# Patient Record
Sex: Male | Born: 1935 | Race: White | Hispanic: No | Smoking: Former smoker
Health system: Southern US, Community
[De-identification: ages and names within clinical notes are randomized; demographics above are authoritative.]

## PROBLEM LIST (undated history)

## (undated) DIAGNOSIS — M1990 Unspecified osteoarthritis, unspecified site: Secondary | ICD-10-CM

## (undated) DIAGNOSIS — C801 Malignant (primary) neoplasm, unspecified: Secondary | ICD-10-CM

## (undated) DIAGNOSIS — I4891 Unspecified atrial fibrillation: Secondary | ICD-10-CM

## (undated) DIAGNOSIS — E785 Hyperlipidemia, unspecified: Secondary | ICD-10-CM

## (undated) DIAGNOSIS — E119 Type 2 diabetes mellitus without complications: Secondary | ICD-10-CM

## (undated) DIAGNOSIS — I639 Cerebral infarction, unspecified: Secondary | ICD-10-CM

## (undated) DIAGNOSIS — I509 Heart failure, unspecified: Secondary | ICD-10-CM

## (undated) DIAGNOSIS — I2510 Atherosclerotic heart disease of native coronary artery without angina pectoris: Secondary | ICD-10-CM

## (undated) DIAGNOSIS — I872 Venous insufficiency (chronic) (peripheral): Secondary | ICD-10-CM

## (undated) DIAGNOSIS — G2581 Restless legs syndrome: Secondary | ICD-10-CM

## (undated) DIAGNOSIS — I10 Essential (primary) hypertension: Secondary | ICD-10-CM

## (undated) HISTORY — PX: HERNIA REPAIR: SHX51

## (undated) HISTORY — DX: Cerebral infarction, unspecified: I63.9

## (undated) HISTORY — DX: Heart failure, unspecified: I50.9

## (undated) HISTORY — PX: SKIN CANCER EXCISION: SHX779

## (undated) HISTORY — PX: SHOULDER SURGERY: SHX246

## (undated) HISTORY — DX: Hyperlipidemia, unspecified: E78.5

## (undated) HISTORY — DX: Malignant (primary) neoplasm, unspecified: C80.1

## (undated) HISTORY — PX: EYE SURGERY: SHX253

## (undated) HISTORY — PX: BACK SURGERY: SHX140

## (undated) HISTORY — DX: Venous insufficiency (chronic) (peripheral): I87.2

## (undated) HISTORY — DX: Atherosclerotic heart disease of native coronary artery without angina pectoris: I25.10

## (undated) HISTORY — DX: Unspecified osteoarthritis, unspecified site: M19.90

## (undated) HISTORY — DX: Essential (primary) hypertension: I10

## (undated) HISTORY — DX: Type 2 diabetes mellitus without complications: E11.9

---

## 2004-08-02 ENCOUNTER — Ambulatory Visit: Payer: Self-pay | Admitting: Family Medicine

## 2005-02-10 ENCOUNTER — Other Ambulatory Visit: Payer: Self-pay

## 2005-02-10 ENCOUNTER — Inpatient Hospital Stay: Payer: Self-pay | Admitting: Internal Medicine

## 2005-07-23 ENCOUNTER — Inpatient Hospital Stay: Payer: Self-pay | Admitting: Internal Medicine

## 2005-07-26 ENCOUNTER — Inpatient Hospital Stay: Payer: Self-pay | Admitting: Internal Medicine

## 2005-07-26 ENCOUNTER — Other Ambulatory Visit: Payer: Self-pay

## 2005-07-27 ENCOUNTER — Other Ambulatory Visit: Payer: Self-pay

## 2005-09-20 ENCOUNTER — Ambulatory Visit: Payer: Self-pay | Admitting: Family Medicine

## 2006-08-02 ENCOUNTER — Ambulatory Visit: Payer: Self-pay | Admitting: Ophthalmology

## 2006-08-09 ENCOUNTER — Ambulatory Visit: Payer: Self-pay | Admitting: Ophthalmology

## 2006-09-13 ENCOUNTER — Ambulatory Visit: Payer: Self-pay | Admitting: Ophthalmology

## 2006-09-20 ENCOUNTER — Ambulatory Visit: Payer: Self-pay | Admitting: Ophthalmology

## 2006-11-15 ENCOUNTER — Emergency Department: Payer: Self-pay

## 2006-11-15 ENCOUNTER — Other Ambulatory Visit: Payer: Self-pay

## 2006-11-22 ENCOUNTER — Ambulatory Visit: Payer: Self-pay | Admitting: Family Medicine

## 2007-04-03 ENCOUNTER — Ambulatory Visit: Payer: Self-pay | Admitting: Family Medicine

## 2007-04-03 ENCOUNTER — Ambulatory Visit: Payer: Self-pay | Admitting: Gastroenterology

## 2007-04-04 ENCOUNTER — Ambulatory Visit: Payer: Self-pay | Admitting: Family Medicine

## 2007-04-12 ENCOUNTER — Inpatient Hospital Stay: Payer: Self-pay | Admitting: Surgery

## 2007-05-15 ENCOUNTER — Ambulatory Visit: Payer: Self-pay | Admitting: Oncology

## 2007-05-22 ENCOUNTER — Ambulatory Visit: Payer: Self-pay | Admitting: Surgery

## 2007-06-14 ENCOUNTER — Ambulatory Visit: Payer: Self-pay | Admitting: Oncology

## 2007-07-15 ENCOUNTER — Ambulatory Visit: Payer: Self-pay | Admitting: Oncology

## 2007-08-12 ENCOUNTER — Ambulatory Visit: Payer: Self-pay | Admitting: Oncology

## 2007-09-12 ENCOUNTER — Ambulatory Visit: Payer: Self-pay | Admitting: Oncology

## 2007-10-12 ENCOUNTER — Ambulatory Visit: Payer: Self-pay | Admitting: Oncology

## 2007-11-12 ENCOUNTER — Ambulatory Visit: Payer: Self-pay | Admitting: Oncology

## 2007-12-12 ENCOUNTER — Ambulatory Visit: Payer: Self-pay | Admitting: Oncology

## 2008-01-12 ENCOUNTER — Ambulatory Visit: Payer: Self-pay | Admitting: Oncology

## 2008-01-26 ENCOUNTER — Other Ambulatory Visit: Payer: Self-pay

## 2008-01-26 ENCOUNTER — Emergency Department: Payer: Self-pay | Admitting: Emergency Medicine

## 2008-02-12 ENCOUNTER — Ambulatory Visit: Payer: Self-pay | Admitting: Oncology

## 2008-03-13 ENCOUNTER — Ambulatory Visit: Payer: Self-pay | Admitting: Oncology

## 2008-03-28 ENCOUNTER — Ambulatory Visit: Payer: Self-pay | Admitting: Surgery

## 2008-04-03 ENCOUNTER — Ambulatory Visit: Payer: Self-pay | Admitting: Surgery

## 2008-04-13 ENCOUNTER — Ambulatory Visit: Payer: Self-pay | Admitting: Oncology

## 2008-05-13 ENCOUNTER — Ambulatory Visit: Payer: Self-pay | Admitting: Oncology

## 2008-06-13 ENCOUNTER — Ambulatory Visit: Payer: Self-pay | Admitting: Oncology

## 2008-07-14 ENCOUNTER — Ambulatory Visit: Payer: Self-pay | Admitting: Oncology

## 2008-08-11 ENCOUNTER — Ambulatory Visit: Payer: Self-pay | Admitting: Oncology

## 2008-09-11 ENCOUNTER — Ambulatory Visit: Payer: Self-pay | Admitting: Oncology

## 2008-10-11 ENCOUNTER — Ambulatory Visit: Payer: Self-pay | Admitting: Oncology

## 2008-11-11 ENCOUNTER — Ambulatory Visit: Payer: Self-pay | Admitting: Oncology

## 2008-11-19 ENCOUNTER — Ambulatory Visit: Payer: Self-pay | Admitting: Family Medicine

## 2008-12-16 ENCOUNTER — Ambulatory Visit: Payer: Self-pay | Admitting: Oncology

## 2009-01-11 ENCOUNTER — Ambulatory Visit: Payer: Self-pay | Admitting: Oncology

## 2009-02-08 ENCOUNTER — Inpatient Hospital Stay: Payer: Self-pay | Admitting: Internal Medicine

## 2009-02-11 ENCOUNTER — Ambulatory Visit: Payer: Self-pay | Admitting: Oncology

## 2009-03-25 ENCOUNTER — Ambulatory Visit: Payer: Self-pay | Admitting: Family Medicine

## 2009-04-13 ENCOUNTER — Ambulatory Visit: Payer: Self-pay | Admitting: Oncology

## 2009-04-14 ENCOUNTER — Ambulatory Visit: Payer: Self-pay | Admitting: Oncology

## 2009-05-04 ENCOUNTER — Ambulatory Visit: Payer: Self-pay | Admitting: Gastroenterology

## 2009-05-13 ENCOUNTER — Ambulatory Visit: Payer: Self-pay | Admitting: Oncology

## 2009-05-26 ENCOUNTER — Ambulatory Visit: Payer: Self-pay | Admitting: Surgery

## 2009-07-14 ENCOUNTER — Ambulatory Visit: Payer: Self-pay | Admitting: Oncology

## 2009-08-04 ENCOUNTER — Ambulatory Visit: Payer: Self-pay | Admitting: Oncology

## 2009-08-11 ENCOUNTER — Ambulatory Visit: Payer: Self-pay | Admitting: Oncology

## 2010-02-11 ENCOUNTER — Ambulatory Visit: Payer: Self-pay | Admitting: Oncology

## 2010-02-24 ENCOUNTER — Ambulatory Visit: Payer: Self-pay | Admitting: Nurse Practitioner

## 2010-02-26 LAB — CEA: CEA: 2.1 ng/mL (ref 0.0–4.7)

## 2010-03-13 ENCOUNTER — Ambulatory Visit: Payer: Self-pay | Admitting: Nurse Practitioner

## 2010-03-13 ENCOUNTER — Ambulatory Visit: Payer: Self-pay | Admitting: Oncology

## 2010-04-19 ENCOUNTER — Ambulatory Visit: Payer: Self-pay | Admitting: Family Medicine

## 2010-05-10 ENCOUNTER — Ambulatory Visit: Payer: Self-pay | Admitting: Family Medicine

## 2010-05-27 ENCOUNTER — Ambulatory Visit: Payer: Self-pay | Admitting: Family Medicine

## 2010-06-23 ENCOUNTER — Ambulatory Visit: Payer: Self-pay | Admitting: Oncology

## 2010-06-25 LAB — CEA: CEA: 2 ng/mL (ref 0.0–4.7)

## 2010-07-14 ENCOUNTER — Ambulatory Visit: Payer: Self-pay | Admitting: Oncology

## 2010-08-25 ENCOUNTER — Ambulatory Visit: Payer: Self-pay | Admitting: Oncology

## 2010-09-12 ENCOUNTER — Ambulatory Visit: Payer: Self-pay | Admitting: Oncology

## 2011-07-01 ENCOUNTER — Ambulatory Visit: Payer: Self-pay

## 2012-01-10 ENCOUNTER — Emergency Department: Payer: Self-pay | Admitting: Emergency Medicine

## 2012-01-10 LAB — COMPREHENSIVE METABOLIC PANEL
Albumin: 4.2 g/dL (ref 3.4–5.0)
Alkaline Phosphatase: 95 U/L (ref 50–136)
BUN: 30 mg/dL — ABNORMAL HIGH (ref 7–18)
Bilirubin,Total: 0.5 mg/dL (ref 0.2–1.0)
Co2: 30 mmol/L (ref 21–32)
Creatinine: 1.25 mg/dL (ref 0.60–1.30)
EGFR (African American): >60
EGFR (Non-African Amer.): 54 — ABNORMAL LOW
Glucose: 423 mg/dL — ABNORMAL HIGH (ref 65–99)
SGOT(AST): 27 U/L (ref 15–37)
SGPT (ALT): 24 U/L
Total Protein: 7.3 g/dL (ref 6.4–8.2)

## 2012-01-10 LAB — CBC WITH DIFFERENTIAL/PLATELET
Basophil #: 0 10*3/uL (ref 0.0–0.1)
Eosinophil #: 0.3 10*3/uL (ref 0.0–0.7)
Lymphocyte %: 22.4 %
MCH: 32.3 pg (ref 26.0–34.0)
MCHC: 34.9 g/dL (ref 32.0–36.0)
Monocyte #: 0.5 x10 3/mm (ref 0.2–1.0)
Neutrophil %: 65.8 %
Platelet: 144 10*3/uL — ABNORMAL LOW (ref 150–440)
RBC: 4.19 10*6/uL — ABNORMAL LOW (ref 4.40–5.90)
RDW: 13.9 % (ref 11.5–14.5)
WBC: 6.5 10*3/uL (ref 3.8–10.6)

## 2012-01-10 LAB — MAGNESIUM: Magnesium: 1.6 mg/dL — ABNORMAL LOW

## 2012-01-10 LAB — URINALYSIS, COMPLETE
Bacteria: NONE SEEN
Leukocyte Esterase: NEGATIVE
Ph: 5 (ref 4.5–8.0)
Protein: NEGATIVE
RBC,UR: <1 /HPF (ref 0–5)

## 2012-01-10 LAB — CK TOTAL AND CKMB (NOT AT ARMC)
CK, Total: 73 U/L (ref 35–232)
CK-MB: 3 ng/mL (ref 0.5–3.6)

## 2012-01-10 LAB — TSH: Thyroid Stimulating Horm: 1.22 u[IU]/mL

## 2012-01-10 LAB — TROPONIN I: Troponin-I: <0.02 ng/mL

## 2012-06-13 HISTORY — PX: COLONOSCOPY W/ BIOPSIES AND MANOMETRY CATHETER PLACEMENT: SHX1375

## 2012-06-18 ENCOUNTER — Ambulatory Visit: Payer: Self-pay | Admitting: Surgery

## 2012-06-18 LAB — BASIC METABOLIC PANEL
BUN: 21 mg/dL — ABNORMAL HIGH (ref 7–18)
Calcium, Total: 9 mg/dL (ref 8.5–10.1)
Co2: 28 mmol/L (ref 21–32)
Creatinine: 0.93 mg/dL (ref 0.60–1.30)
EGFR (African American): >60
EGFR (Non-African Amer.): >60
Glucose: 120 mg/dL — ABNORMAL HIGH (ref 65–99)
Potassium: 2.9 mmol/L — ABNORMAL LOW (ref 3.5–5.1)

## 2012-06-18 LAB — CBC WITH DIFFERENTIAL/PLATELET
Basophil #: 0 10*3/uL (ref 0.0–0.1)
Basophil %: 0.7 %
Eosinophil %: 3.9 %
HGB: 12.7 g/dL — ABNORMAL LOW (ref 13.0–18.0)
MCH: 30 pg (ref 26.0–34.0)
MCHC: 33.5 g/dL (ref 32.0–36.0)
MCV: 90 fL (ref 80–100)
Monocyte %: 7.7 %
Neutrophil %: 66.5 %
RBC: 4.22 10*6/uL — ABNORMAL LOW (ref 4.40–5.90)
WBC: 6.5 10*3/uL (ref 3.8–10.6)

## 2012-06-21 ENCOUNTER — Ambulatory Visit: Payer: Self-pay | Admitting: Surgery

## 2012-06-21 LAB — POTASSIUM: Potassium: 4.2 mmol/L (ref 3.5–5.1)

## 2012-06-22 ENCOUNTER — Ambulatory Visit: Payer: Self-pay | Admitting: Surgery

## 2012-06-22 LAB — POTASSIUM: Potassium: 4 mmol/L (ref 3.5–5.1)

## 2012-06-25 LAB — PATHOLOGY REPORT

## 2012-07-04 ENCOUNTER — Ambulatory Visit: Payer: Self-pay | Admitting: Family Medicine

## 2013-05-06 ENCOUNTER — Ambulatory Visit: Payer: Self-pay | Admitting: Gastroenterology

## 2013-05-21 ENCOUNTER — Ambulatory Visit: Payer: Self-pay | Admitting: Family Medicine

## 2013-05-21 LAB — CBC WITH DIFFERENTIAL/PLATELET
Basophil #: 0 10*3/uL (ref 0.0–0.1)
HCT: 31.9 % — ABNORMAL LOW (ref 40.0–52.0)
Lymphocyte #: 0.6 10*3/uL — ABNORMAL LOW (ref 1.0–3.6)
Lymphocyte %: 16.6 %
MCH: 29.9 pg (ref 26.0–34.0)
MCHC: 32.4 g/dL (ref 32.0–36.0)
MCV: 92 fL (ref 80–100)
Monocyte #: 0.4 x10 3/mm (ref 0.2–1.0)
Neutrophil %: 65.6 %

## 2013-05-23 ENCOUNTER — Inpatient Hospital Stay: Payer: Self-pay | Admitting: Internal Medicine

## 2013-05-23 LAB — TROPONIN I: Troponin-I: <0.02 ng/mL

## 2013-05-23 LAB — CBC
HCT: 34.8 % — ABNORMAL LOW (ref 40.0–52.0)
MCH: 29.2 pg (ref 26.0–34.0)
MCV: 90 fL (ref 80–100)
WBC: 4.1 10*3/uL (ref 3.8–10.6)

## 2013-05-23 LAB — COMPREHENSIVE METABOLIC PANEL
Albumin: 3.3 g/dL — ABNORMAL LOW (ref 3.4–5.0)
Alkaline Phosphatase: 98 U/L
BUN: 13 mg/dL (ref 7–18)
Bilirubin,Total: 0.7 mg/dL (ref 0.2–1.0)
Calcium, Total: 8.9 mg/dL (ref 8.5–10.1)
Creatinine: 0.86 mg/dL (ref 0.60–1.30)
EGFR (Non-African Amer.): >60
Glucose: 167 mg/dL — ABNORMAL HIGH (ref 65–99)
Osmolality: 278 (ref 275–301)
Total Protein: 6.7 g/dL (ref 6.4–8.2)

## 2013-05-23 LAB — MAGNESIUM: Magnesium: 1.5 mg/dL — ABNORMAL LOW

## 2013-05-24 LAB — TSH: Thyroid Stimulating Horm: 0.881 u[IU]/mL

## 2013-05-24 LAB — HEMOGLOBIN A1C: Hemoglobin A1C: 7.9 % — ABNORMAL HIGH (ref 4.2–6.3)

## 2013-09-22 ENCOUNTER — Inpatient Hospital Stay: Payer: Self-pay | Admitting: Internal Medicine

## 2013-09-22 LAB — CK TOTAL AND CKMB (NOT AT ARMC)
CK, Total: 235 U/L
CK-MB: 5.1 ng/mL — AB (ref 0.5–3.6)

## 2013-09-22 LAB — BASIC METABOLIC PANEL
Anion Gap: 6 — ABNORMAL LOW (ref 7–16)
BUN: 23 mg/dL — AB (ref 7–18)
CALCIUM: 8.7 mg/dL (ref 8.5–10.1)
CHLORIDE: 103 mmol/L (ref 98–107)
CO2: 33 mmol/L — AB (ref 21–32)
Creatinine: 0.95 mg/dL (ref 0.60–1.30)
EGFR (African American): >60
EGFR (Non-African Amer.): >60
Glucose: 114 mg/dL — ABNORMAL HIGH (ref 65–99)
Osmolality: 288 (ref 275–301)
Potassium: 3.3 mmol/L — ABNORMAL LOW (ref 3.5–5.1)
SODIUM: 142 mmol/L (ref 136–145)

## 2013-09-22 LAB — CBC
HCT: 34.1 % — ABNORMAL LOW (ref 40.0–52.0)
HGB: 11.2 g/dL — AB (ref 13.0–18.0)
MCH: 29.4 pg (ref 26.0–34.0)
MCHC: 32.7 g/dL (ref 32.0–36.0)
MCV: 90 fL (ref 80–100)
Platelet: 126 10*3/uL — ABNORMAL LOW (ref 150–440)
RBC: 3.8 10*6/uL — ABNORMAL LOW (ref 4.40–5.90)
RDW: 15.9 % — ABNORMAL HIGH (ref 11.5–14.5)
WBC: 5.6 10*3/uL (ref 3.8–10.6)

## 2013-09-22 LAB — TSH: Thyroid Stimulating Horm: 3.91 u[IU]/mL

## 2013-09-22 LAB — PRO B NATRIURETIC PEPTIDE: B-TYPE NATIURETIC PEPTID: 1709 pg/mL — AB (ref 0–450)

## 2013-09-22 LAB — PROTIME-INR
INR: 1.3
Prothrombin Time: 15.8 secs — ABNORMAL HIGH (ref 11.5–14.7)

## 2013-09-22 LAB — TROPONIN I: Troponin-I: <0.02 ng/mL

## 2013-09-22 LAB — APTT: Activated PTT: 30 secs (ref 23.6–35.9)

## 2013-09-23 LAB — BASIC METABOLIC PANEL
ANION GAP: 7 (ref 7–16)
BUN: 18 mg/dL (ref 7–18)
CALCIUM: 8.3 mg/dL — AB (ref 8.5–10.1)
CO2: 33 mmol/L — AB (ref 21–32)
CREATININE: 0.8 mg/dL (ref 0.60–1.30)
Chloride: 104 mmol/L (ref 98–107)
EGFR (Non-African Amer.): >60
GLUCOSE: 77 mg/dL (ref 65–99)
Osmolality: 288 (ref 275–301)
Potassium: 2.9 mmol/L — ABNORMAL LOW (ref 3.5–5.1)
Sodium: 144 mmol/L (ref 136–145)

## 2013-09-23 LAB — TROPONIN I
Troponin-I: 0.02 ng/mL
Troponin-I: <0.02 ng/mL

## 2013-09-24 LAB — BASIC METABOLIC PANEL
Anion Gap: 3 — ABNORMAL LOW (ref 7–16)
BUN: 21 mg/dL — ABNORMAL HIGH (ref 7–18)
CALCIUM: 8.6 mg/dL (ref 8.5–10.1)
Chloride: 104 mmol/L (ref 98–107)
Co2: 35 mmol/L — ABNORMAL HIGH (ref 21–32)
Creatinine: 0.98 mg/dL (ref 0.60–1.30)
GLUCOSE: 44 mg/dL — AB (ref 65–99)
OSMOLALITY: 283 (ref 275–301)
POTASSIUM: 3.2 mmol/L — AB (ref 3.5–5.1)
SODIUM: 142 mmol/L (ref 136–145)

## 2013-09-24 LAB — PROTIME-INR
INR: 1.3
Prothrombin Time: 15.8 secs — ABNORMAL HIGH (ref 11.5–14.7)

## 2013-09-24 LAB — MAGNESIUM: Magnesium: 1.9 mg/dL

## 2013-09-25 LAB — PROTIME-INR
INR: 1.5
Prothrombin Time: 18.1 secs — ABNORMAL HIGH (ref 11.5–14.7)

## 2013-09-25 LAB — BASIC METABOLIC PANEL
ANION GAP: 5 — AB (ref 7–16)
BUN: 22 mg/dL — ABNORMAL HIGH (ref 7–18)
CO2: 32 mmol/L (ref 21–32)
Calcium, Total: 8.4 mg/dL — ABNORMAL LOW (ref 8.5–10.1)
Chloride: 104 mmol/L (ref 98–107)
Creatinine: 0.79 mg/dL (ref 0.60–1.30)
EGFR (African American): >60
EGFR (Non-African Amer.): >60
GLUCOSE: 119 mg/dL — AB (ref 65–99)
Osmolality: 286 (ref 275–301)
POTASSIUM: 3.8 mmol/L (ref 3.5–5.1)
SODIUM: 141 mmol/L (ref 136–145)

## 2013-09-26 LAB — PROTIME-INR
INR: 1.8
PROTHROMBIN TIME: 20.2 s — AB (ref 11.5–14.7)

## 2013-09-26 LAB — HEMOGLOBIN: HGB: 10.8 g/dL — ABNORMAL LOW (ref 13.0–18.0)

## 2013-09-26 LAB — PLATELET COUNT: Platelet: 101 10*3/uL — ABNORMAL LOW (ref 150–440)

## 2013-09-27 LAB — BASIC METABOLIC PANEL
Anion Gap: 2 — ABNORMAL LOW (ref 7–16)
BUN: 26 mg/dL — ABNORMAL HIGH (ref 7–18)
CALCIUM: 8.5 mg/dL (ref 8.5–10.1)
Chloride: 100 mmol/L (ref 98–107)
Co2: 34 mmol/L — ABNORMAL HIGH (ref 21–32)
Creatinine: 1 mg/dL (ref 0.60–1.30)
EGFR (African American): >60
EGFR (Non-African Amer.): >60
Glucose: 319 mg/dL — ABNORMAL HIGH (ref 65–99)
OSMOLALITY: 289 (ref 275–301)
Potassium: 4.1 mmol/L (ref 3.5–5.1)
SODIUM: 136 mmol/L (ref 136–145)

## 2013-09-27 LAB — CBC WITH DIFFERENTIAL/PLATELET
BASOS PCT: 0.9 %
Basophil #: 0 10*3/uL (ref 0.0–0.1)
EOS PCT: 5.4 %
Eosinophil #: 0.2 10*3/uL (ref 0.0–0.7)
HCT: 31.7 % — AB (ref 40.0–52.0)
HGB: 10.2 g/dL — ABNORMAL LOW (ref 13.0–18.0)
Lymphocyte #: 1 10*3/uL (ref 1.0–3.6)
Lymphocyte %: 22.3 %
MCH: 28.7 pg (ref 26.0–34.0)
MCHC: 32.3 g/dL (ref 32.0–36.0)
MCV: 89 fL (ref 80–100)
MONOS PCT: 10.7 %
Monocyte #: 0.5 x10 3/mm (ref 0.2–1.0)
NEUTROS ABS: 2.6 10*3/uL (ref 1.4–6.5)
Neutrophil %: 60.7 %
PLATELETS: 98 10*3/uL — AB (ref 150–440)
RBC: 3.56 10*6/uL — AB (ref 4.40–5.90)
RDW: 16.1 % — ABNORMAL HIGH (ref 11.5–14.5)
WBC: 4.4 10*3/uL (ref 3.8–10.6)

## 2013-09-27 LAB — PROTIME-INR
INR: 2
Prothrombin Time: 22.2 secs — ABNORMAL HIGH (ref 11.5–14.7)

## 2013-09-28 LAB — CBC WITH DIFFERENTIAL/PLATELET
Basophil #: 0 10*3/uL (ref 0.0–0.1)
Basophil %: 0.8 %
EOS ABS: 0.3 10*3/uL (ref 0.0–0.7)
EOS PCT: 5.4 %
HCT: 33.8 % — ABNORMAL LOW (ref 40.0–52.0)
HGB: 10.7 g/dL — ABNORMAL LOW (ref 13.0–18.0)
Lymphocyte #: 1.2 10*3/uL (ref 1.0–3.6)
Lymphocyte %: 22.6 %
MCH: 28.3 pg (ref 26.0–34.0)
MCHC: 31.5 g/dL — ABNORMAL LOW (ref 32.0–36.0)
MCV: 90 fL (ref 80–100)
Monocyte #: 0.5 x10 3/mm (ref 0.2–1.0)
Monocyte %: 9.7 %
NEUTROS ABS: 3.3 10*3/uL (ref 1.4–6.5)
Neutrophil %: 61.5 %
PLATELETS: 117 10*3/uL — AB (ref 150–440)
RBC: 3.77 10*6/uL — ABNORMAL LOW (ref 4.40–5.90)
RDW: 16.2 % — ABNORMAL HIGH (ref 11.5–14.5)
WBC: 5.3 10*3/uL (ref 3.8–10.6)

## 2013-09-28 LAB — PROTIME-INR
INR: 2
Prothrombin Time: 22.4 secs — ABNORMAL HIGH (ref 11.5–14.7)

## 2014-03-07 ENCOUNTER — Inpatient Hospital Stay: Payer: Self-pay | Admitting: Internal Medicine

## 2014-03-07 LAB — COMPREHENSIVE METABOLIC PANEL
ALBUMIN: 3.7 g/dL (ref 3.4–5.0)
Alkaline Phosphatase: 109 U/L
Anion Gap: 5 — ABNORMAL LOW (ref 7–16)
BILIRUBIN TOTAL: 0.8 mg/dL (ref 0.2–1.0)
BUN: 21 mg/dL — ABNORMAL HIGH (ref 7–18)
CALCIUM: 8.3 mg/dL — AB (ref 8.5–10.1)
CO2: 32 mmol/L (ref 21–32)
CREATININE: 1.12 mg/dL (ref 0.60–1.30)
Chloride: 103 mmol/L (ref 98–107)
EGFR (Non-African Amer.): >60
Glucose: 173 mg/dL — ABNORMAL HIGH (ref 65–99)
Osmolality: 287 (ref 275–301)
POTASSIUM: 3.6 mmol/L (ref 3.5–5.1)
SGOT(AST): 24 U/L (ref 15–37)
SGPT (ALT): 30 U/L
SODIUM: 140 mmol/L (ref 136–145)
Total Protein: 6.8 g/dL (ref 6.4–8.2)

## 2014-03-07 LAB — CK TOTAL AND CKMB (NOT AT ARMC)
CK, TOTAL: 125 U/L
CK-MB: 3.3 ng/mL (ref 0.5–3.6)

## 2014-03-07 LAB — PROTIME-INR
INR: 1.2
Prothrombin Time: 15.4 secs — ABNORMAL HIGH (ref 11.5–14.7)

## 2014-03-07 LAB — CBC
HCT: 32.3 % — ABNORMAL LOW (ref 40.0–52.0)
HGB: 10.5 g/dL — AB (ref 13.0–18.0)
MCH: 28.9 pg (ref 26.0–34.0)
MCHC: 32.5 g/dL (ref 32.0–36.0)
MCV: 89 fL (ref 80–100)
Platelet: 117 10*3/uL — ABNORMAL LOW (ref 150–440)
RBC: 3.63 10*6/uL — ABNORMAL LOW (ref 4.40–5.90)
RDW: 16.7 % — ABNORMAL HIGH (ref 11.5–14.5)
WBC: 5.3 10*3/uL (ref 3.8–10.6)

## 2014-03-07 LAB — TROPONIN I: Troponin-I: 0.03 ng/mL

## 2014-03-07 LAB — PRO B NATRIURETIC PEPTIDE: B-Type Natriuretic Peptide: 3038 pg/mL — ABNORMAL HIGH (ref 0–450)

## 2014-03-08 LAB — CBC WITH DIFFERENTIAL/PLATELET
Basophil #: 0.1 10*3/uL (ref 0.0–0.1)
Basophil %: 1.2 %
Eosinophil #: 0.4 10*3/uL (ref 0.0–0.7)
Eosinophil %: 6.8 %
HCT: 33.8 % — AB (ref 40.0–52.0)
HGB: 11 g/dL — AB (ref 13.0–18.0)
Lymphocyte #: 1.2 10*3/uL (ref 1.0–3.6)
Lymphocyte %: 21.4 %
MCH: 29.1 pg (ref 26.0–34.0)
MCHC: 32.5 g/dL (ref 32.0–36.0)
MCV: 90 fL (ref 80–100)
Monocyte #: 0.5 x10 3/mm (ref 0.2–1.0)
Monocyte %: 9.5 %
NEUTROS PCT: 61.1 %
Neutrophil #: 3.3 10*3/uL (ref 1.4–6.5)
PLATELETS: 106 10*3/uL — AB (ref 150–440)
RBC: 3.77 10*6/uL — AB (ref 4.40–5.90)
RDW: 16.8 % — ABNORMAL HIGH (ref 11.5–14.5)
WBC: 5.5 10*3/uL (ref 3.8–10.6)

## 2014-03-08 LAB — COMPREHENSIVE METABOLIC PANEL
ALBUMIN: 3.6 g/dL (ref 3.4–5.0)
ANION GAP: 7 (ref 7–16)
Alkaline Phosphatase: 97 U/L
BILIRUBIN TOTAL: 0.8 mg/dL (ref 0.2–1.0)
BUN: 19 mg/dL — ABNORMAL HIGH (ref 7–18)
CREATININE: 0.94 mg/dL (ref 0.60–1.30)
Calcium, Total: 8.6 mg/dL (ref 8.5–10.1)
Chloride: 102 mmol/L (ref 98–107)
Co2: 33 mmol/L — ABNORMAL HIGH (ref 21–32)
Glucose: 192 mg/dL — ABNORMAL HIGH (ref 65–99)
OSMOLALITY: 291 (ref 275–301)
Potassium: 3.5 mmol/L (ref 3.5–5.1)
SGOT(AST): 29 U/L (ref 15–37)
SGPT (ALT): 30 U/L
Sodium: 142 mmol/L (ref 136–145)
Total Protein: 6.5 g/dL (ref 6.4–8.2)

## 2014-03-08 LAB — PRO B NATRIURETIC PEPTIDE: B-Type Natriuretic Peptide: 2972 pg/mL — ABNORMAL HIGH (ref 0–450)

## 2014-03-08 LAB — MAGNESIUM: MAGNESIUM: 1.6 mg/dL — AB

## 2014-03-08 LAB — TROPONIN I
Troponin-I: 0.03 ng/mL
Troponin-I: 0.03 ng/mL

## 2014-07-16 ENCOUNTER — Emergency Department: Payer: Self-pay | Admitting: Emergency Medicine

## 2014-07-16 DIAGNOSIS — R079 Chest pain, unspecified: Secondary | ICD-10-CM | POA: Diagnosis not present

## 2014-07-16 DIAGNOSIS — Z794 Long term (current) use of insulin: Secondary | ICD-10-CM | POA: Diagnosis not present

## 2014-07-16 DIAGNOSIS — I517 Cardiomegaly: Secondary | ICD-10-CM | POA: Diagnosis not present

## 2014-07-16 DIAGNOSIS — I10 Essential (primary) hypertension: Secondary | ICD-10-CM | POA: Diagnosis not present

## 2014-07-16 DIAGNOSIS — Z7982 Long term (current) use of aspirin: Secondary | ICD-10-CM | POA: Diagnosis not present

## 2014-07-16 DIAGNOSIS — E119 Type 2 diabetes mellitus without complications: Secondary | ICD-10-CM | POA: Diagnosis not present

## 2014-07-16 DIAGNOSIS — R0602 Shortness of breath: Secondary | ICD-10-CM | POA: Diagnosis not present

## 2014-07-16 DIAGNOSIS — Z79899 Other long term (current) drug therapy: Secondary | ICD-10-CM | POA: Diagnosis not present

## 2014-07-16 LAB — PRO B NATRIURETIC PEPTIDE: B-Type Natriuretic Peptide: 2023 pg/mL — ABNORMAL HIGH

## 2014-07-16 LAB — CBC
HCT: 33.8 % — AB (ref 40.0–52.0)
HGB: 10.9 g/dL — AB (ref 13.0–18.0)
MCH: 29 pg (ref 26.0–34.0)
MCHC: 32.3 g/dL (ref 32.0–36.0)
MCV: 90 fL (ref 80–100)
Platelet: 90 10*3/uL — ABNORMAL LOW (ref 150–440)
RBC: 3.78 10*6/uL — ABNORMAL LOW (ref 4.40–5.90)
RDW: 17.9 % — ABNORMAL HIGH (ref 11.5–14.5)
WBC: 5.8 10*3/uL (ref 3.8–10.6)

## 2014-07-16 LAB — TROPONIN I: Troponin-I: 0.03 ng/mL

## 2014-07-16 LAB — BASIC METABOLIC PANEL
ANION GAP: 8 (ref 7–16)
BUN: 21 mg/dL — AB (ref 7–18)
CO2: 33 mmol/L — AB (ref 21–32)
CREATININE: 1.17 mg/dL (ref 0.60–1.30)
Calcium, Total: 8.6 mg/dL (ref 8.5–10.1)
Chloride: 104 mmol/L (ref 98–107)
EGFR (African American): >60
GLUCOSE: 146 mg/dL — AB (ref 65–99)
Osmolality: 294 (ref 275–301)
Potassium: 3.2 mmol/L — ABNORMAL LOW (ref 3.5–5.1)
SODIUM: 145 mmol/L (ref 136–145)

## 2014-07-18 DIAGNOSIS — E782 Mixed hyperlipidemia: Secondary | ICD-10-CM | POA: Diagnosis not present

## 2014-07-18 DIAGNOSIS — I5032 Chronic diastolic (congestive) heart failure: Secondary | ICD-10-CM | POA: Diagnosis not present

## 2014-07-18 DIAGNOSIS — I350 Nonrheumatic aortic (valve) stenosis: Secondary | ICD-10-CM | POA: Diagnosis not present

## 2014-07-18 DIAGNOSIS — I480 Paroxysmal atrial fibrillation: Secondary | ICD-10-CM | POA: Diagnosis not present

## 2014-07-18 DIAGNOSIS — I110 Hypertensive heart disease with heart failure: Secondary | ICD-10-CM | POA: Diagnosis not present

## 2014-07-18 DIAGNOSIS — I10 Essential (primary) hypertension: Secondary | ICD-10-CM | POA: Diagnosis not present

## 2014-07-22 DIAGNOSIS — I110 Hypertensive heart disease with heart failure: Secondary | ICD-10-CM | POA: Diagnosis not present

## 2014-07-22 DIAGNOSIS — I5032 Chronic diastolic (congestive) heart failure: Secondary | ICD-10-CM | POA: Diagnosis not present

## 2014-07-22 DIAGNOSIS — I350 Nonrheumatic aortic (valve) stenosis: Secondary | ICD-10-CM | POA: Diagnosis not present

## 2014-07-22 DIAGNOSIS — I482 Chronic atrial fibrillation: Secondary | ICD-10-CM | POA: Diagnosis not present

## 2014-08-07 DIAGNOSIS — I5032 Chronic diastolic (congestive) heart failure: Secondary | ICD-10-CM | POA: Diagnosis not present

## 2014-08-07 DIAGNOSIS — I482 Chronic atrial fibrillation: Secondary | ICD-10-CM | POA: Diagnosis not present

## 2014-08-07 DIAGNOSIS — I110 Hypertensive heart disease with heart failure: Secondary | ICD-10-CM | POA: Diagnosis not present

## 2014-08-21 ENCOUNTER — Ambulatory Visit: Payer: Self-pay | Admitting: Family Medicine

## 2014-08-21 DIAGNOSIS — R05 Cough: Secondary | ICD-10-CM | POA: Diagnosis not present

## 2014-08-21 DIAGNOSIS — I10 Essential (primary) hypertension: Secondary | ICD-10-CM | POA: Diagnosis not present

## 2014-08-21 DIAGNOSIS — I5032 Chronic diastolic (congestive) heart failure: Secondary | ICD-10-CM | POA: Diagnosis not present

## 2014-08-21 DIAGNOSIS — I482 Chronic atrial fibrillation: Secondary | ICD-10-CM | POA: Diagnosis not present

## 2014-08-21 DIAGNOSIS — R0602 Shortness of breath: Secondary | ICD-10-CM | POA: Diagnosis not present

## 2014-08-21 DIAGNOSIS — I517 Cardiomegaly: Secondary | ICD-10-CM | POA: Diagnosis not present

## 2014-08-21 DIAGNOSIS — R0600 Dyspnea, unspecified: Secondary | ICD-10-CM | POA: Diagnosis not present

## 2014-08-22 DIAGNOSIS — R001 Bradycardia, unspecified: Secondary | ICD-10-CM | POA: Diagnosis not present

## 2014-08-22 DIAGNOSIS — I350 Nonrheumatic aortic (valve) stenosis: Secondary | ICD-10-CM | POA: Diagnosis not present

## 2014-08-22 DIAGNOSIS — I482 Chronic atrial fibrillation: Secondary | ICD-10-CM | POA: Diagnosis not present

## 2014-08-22 DIAGNOSIS — I5032 Chronic diastolic (congestive) heart failure: Secondary | ICD-10-CM | POA: Diagnosis not present

## 2014-08-26 DIAGNOSIS — I482 Chronic atrial fibrillation: Secondary | ICD-10-CM | POA: Diagnosis not present

## 2014-08-27 DIAGNOSIS — I482 Chronic atrial fibrillation: Secondary | ICD-10-CM | POA: Diagnosis not present

## 2014-08-27 DIAGNOSIS — I5032 Chronic diastolic (congestive) heart failure: Secondary | ICD-10-CM | POA: Diagnosis not present

## 2014-08-27 DIAGNOSIS — R600 Localized edema: Secondary | ICD-10-CM | POA: Diagnosis not present

## 2014-08-27 DIAGNOSIS — I272 Other secondary pulmonary hypertension: Secondary | ICD-10-CM | POA: Diagnosis not present

## 2014-10-03 NOTE — Discharge Summary (Signed)
PATIENT NAME:  [name] Good, [name] Good MR#:  [id number] DATE OF BIRTH:  1930/10/10  DATE OF ADMISSION:  05/23/2013  DATE OF DISCHARGE:  05/25/2013  PRIMARY CARE PHYSICIAN:  Dr. Otilio Miu  CARDIOLOGIST:  Dr. Isaias Cowman  FINAL DIAGNOSES: 1.  Chronic obstructive pulmonary disease exacerbation, with bronchitis.  2.  Diastolic congestive heart failure, with edema.  3.  Chronic atrial fibrillation.  4.  Hypokalemia.  5.  Anemia and thrombocytopenia.  6.  Diabetes.   MEDICATIONS ON DISCHARGE INCLUDE: Coreg 3.125 mg twice a day, furosemide 40 mg twice a day, Novolin 70/30, 15 to 18 units subcutaneous injection at supper time, trazodone 50 mg 2 tablets at bedtime, simvastatin 40 mg 1/2 tablet in the morning, doxazosin 4 mg 1 tablet at bedtime, losartan 50 mg in the morning, diltiazem 120 mg extended-release 1 capsule twice a day, Advair Diskus 250/50, 1 inhalation twice a day, erythromycin ophthalmic solution 0.5, 1 application to affected eye 4 times a day, pramipexole 1 mg daily, Novolin 70/30, 28 units subcutaneous injection in the morning. Prednisone 5 mg 4 tablets day one, 3 tablets day two, 2 tablets days three, 1 tablet days four and five, then stop. Aspirin 325 mg daily, Spiriva 1 inhalation daily, Levaquin 500 mg daily, finish his course. I did write a script just in case he does not have it at home, I gave him 6 more pills. Potassium chloride 10 mEq extended-release daily.   INSTRUCTIONS: Diet: Low sodium diet, carbohydrate-controlled diet, regular consistency. Activity as tolerated. Follow up with Dr. Saralyn Pilar next week, 1 to 2 weeks with Dr. Otilio Miu.   REASON FOR ADMISSION: The patient was admitted 05/23/2013, discharged 05/25/2013. Came in with cough, sputum, shortness of breath, and wheezing for one week. Was admitted with COPD exacerbation.  LABORATORY AND RADIOLOGICAL DATA: Included a chest x-ray that shows cardiomegaly, central pulmonary vascular prominence. Magnesium 1.5,  glucose 167, BUN 13, creatinine 0.86, sodium 137, potassium 3.2, chloride 100, CO2 of 32, calcium 8.9. Liver function tests normal range. Troponin negative. White blood cell count 4.1, H and H 11.3 and 34.8, platelet count of 106. EKG showed atrial fibrillation, slow ventricular response, right bundle branch block, septal infarct, nonspecific ST-T wave changes. Echocardiogram showed an EF of 55% to 60%. TSH of 0.881. Hemoglobin A1c 7.9. Repeat magnesium 1.8.   HOSPITAL COURSE PER PROBLEM LIST:  1.  For the COPD exacerbation and bronchitis, patient was given Levaquin, high-dose steroids initially on presentation. Lungs upon discharge were clear. Switched over to prednisone taper, and continue the Levaquin at home. Spiriva was added to the patient's Advair.   2.  Diastolic congestive heart failure with edema. The patient did have increasing edema at home. Initially switched to IV Lasix, then back to his normal p.o. Lasix. EF on echocardiogram normal range. Continue p.o. Lasix and Coreg at home.   3.  Chronic atrial fibrillation. Case discussed with Dr. Saralyn Pilar, Cardiology. Just aspirin only for anticoagulation at this point, can consider further anticoagulation as outpatient. The patient is rate-controlled with diltiazem and Coreg.   4.  Hypokalemia. This was replaced with the hospital course, also with magnesium. Continue potassium supplementation with twice a day Lasix dosing at home.   5.  Anemia and thrombocytopenia. Follow up as outpatient. Can be secondary to recent infection.   6.  Diabetes. He was kept on his usual 70/30 insulin, no changes were made.  Time spent on discharge: 35 minutes.    ____________________________ Tana Conch. Leslye Peer, MD rjw:mr D: 05/25/2013  15:20:59 ET T: 05/25/2013 18:56:25 ET JOB#: [id number]   cc: Tana Conch. Leslye Peer, MD, <Dictator> Juline Patch, MD Isaias Cowman, MD   Marisue Brooklyn MD ELECTRONICALLY SIGNED 05/31/2013 16:57

## 2014-10-03 NOTE — H&P (Signed)
PATIENT NAME:  [name] Good, RUETH MR#:  [id number] DATE OF BIRTH:  [birth year]  DATE OF ADMISSION:  05/23/2013  PRIMARY CARE PHYSICIAN:  Dr. Ronnald Ramp.  REFERRING PHYSICIAN:  Dr. Jacqualine Code.  CHIEF COMPLAINT: Cough, sputum, shortness of breath, wheezing for 1 week.   HISTORY OF PRESENT ILLNESS: An 81 year old Caucasian male with a history of hypertension, diabetes, COPD, TIA, colon cancer, presented to the ED with the above chief complaint.  The patient is alert, awake, oriented, in no acute distress. The patient said he has had shortness of breath, cough, wheezing for the past 1 week. The patient denies any fever or chills. No headache or dizziness. No chest pain, palpitation, orthopnea or nocturnal dyspnea, but has leg edema for the past 2 weeks. In addition, he has gained weight, a little bit.  The patient denies any other symptoms. No melena or bloody stool. No abdominal pain, nausea, vomiting or diarrhea. The patient took Advair and Levaquin without improvement, so the patient came to the ED for further evaluation.   PAST MEDICAL HISTORY: Hypertension, diabetes, COPD, bronchitis, TIA, colon cancer, restless leg syndrome.   PAST SURGICAL HISTORY: Colon cancer resection, appendectomy, back surgery, hernia repair.   SOCIAL HISTORY: Quit smoking a long time ago. Denies any alcohol drinking or illicit drugs.   FAMILY HISTORY: Hypertension, CAD.   ALLERGIES: BIAXIN.   HOME MEDICATIONS:  1.  Trazodone 50 mg p.o. tablets 2 tablets once a day at bedtime.  2.  Zocor 20 mg p.o. daily.  3.  Pramipexole 1 mg p.o. daily.  4.  Novolin 70/30, 28 units subQ once a day in the morning, 15 to 18 units subQ once a day suppertime.  5.  Metformin 1000 mg p.o. b.i.d.  6.  Losartan 50 mg p.o. daily.  7.  Levaquin 500 mg p.o. daily.  8.  Lasix 40 mg p.o. b.i.d.  9.  Erythromycin ophthalmic 0.5% ophthalmic ointment 1 application to each eye 4 times a day.  10.   Doxazosin 4 mg p.o. once a day at bedtime.  11.  Diltiazem  120 mg 1 cap b.i.d. 12.  Coreg 3.25 mg p.o. b.i.d.  13.  Advair 250 mcg/50 mcg inhalation powder b.i.d.   REVIEW OF SYSTEMS:  CONSTITUTIONAL: The patient denies any fever or chills. No headache or dizziness, but has weakness. EYES:  No double vision or blurred vision.  EAR, NOSE, THROAT: No postnasal drip, slurred speech or dysphagia.  CARDIOVASCULAR: No chest pain, palpitation, orthopnea or nocturnal dyspnea but leg edema.  PULMONARY: Possible cough, sputum, shortness of breath and wheezing, but no hemoptysis.  GASTROINTESTINAL: No abdominal pain, nausea, vomiting or diarrhea. No melena or bloody stool. GENITOURINARY: No dysuria, hematuria or incontinence.  SKIN: No rash or jaundice.  NEUROLOGY: No syncope, loss of consciousness or seizure.  HEMATOLOGY: No easy bruising or bleeding.  ENDOCRINE: No polyuria, polydipsia, heat or cold intolerance.  MUSCULOSKELETAL: Positive leg edema, but no joint pain.   VITAL SIGNS: Temperature 98.3, blood pressure 155/66, pulse 52, respirations 18, O2 saturation 100% oxygen by nasal cannula.   PHYSICAL EXAMINATION:  GENERAL: The patient is alert, awake, oriented, in no acute distress.  HEENT: Pupils round, equal and reactive to light and accommodation.  Moist oral mucosa. Clear oropharynx.  NECK: Supple. No JVD or carotid carotids. No lymphadenopathy. No thyromegaly.  CARDIOVASCULAR: S1, S2. Irregular rate and rhythm. There is a systolic murmur, 3/6. No gallop.  PULMONARY: Bilateral air entry. Bilateral moderate to severe wheezing and crackles. No use of accessory  muscles to breathe.  ABDOMEN: Soft. No distention or tenderness. No organomegaly. Bowel sounds present.  EXTREMITIES: There is leg edema, 2+. No clubbing or cyanosis. No calf tenderness. Bilateral pedal pulses present.  SKIN: No rash or jaundice.  NEUROLOGY: A and O x 3. No focal deficit. Power 5/5. Sensation intact.   LABORATORY DATA: Glucose 167, BUN 13, creatinine 0.86, sodium 137,  potassium 3.2, chloride 100, bicarb 32, calcium 8.9. Troponin less than 0.02. WBC 4.1, hemoglobin 11.3, platelets 106.   CHEST X-RAY: Cardiomyopathy, central pulmonary vascular prominence. No segmental consolidation.   EKG: Showed Afib at 56 bpm with right bundle branch block.   IMPRESSIONS: 1.  Chronic obstructive pulmonary disease exacerbation with acute bronchitis.  2.  Atrial fib, rate controlled.  3.  Hypokalemia.  4.  Anemia.  5.  Thrombocytopenia.  6.  Hypertension.  7.  Diabetes.   PLAN OF TREATMENT: 1.  The patient will be admitted to tele floor. We will start Solu-Medrol, DuoNeb, Spiriva,  continue Advair. 2.  For Afib, we will start aspirin 325 mg daily, get echocardiogram, continue Cardizem, Coreg and follow up with cardiologist, Dr. Saralyn Pilar. 3.  Hypertension. Continue Cardizem, Coreg, Lasix and losartan.  4.  For diabetes, we will start sliding scale. Continue NovoLog 70/30.  5.  For hypokalemia, we will give potassium and follow up potassium level and magnesium level.  6.  For anemia and thrombocytopenia, follow up CBC.   I discussed the patient's condition and plan of treatment with the patient and the patient's son-in-law.   TIME SPENT: About 52 minutes.    ____________________________ Demetrios Loll, MD qc:dmm D: 05/23/2013 13:09:00 ET T: 05/23/2013 13:45:15 ET JOB#: [id number]  cc: Demetrios Loll, MD, <Dictator> Demetrios Loll MD ELECTRONICALLY SIGNED 05/23/2013 14:08

## 2014-10-03 NOTE — Op Note (Signed)
PATIENT NAME:  [name] Good, [name] Good MR#:  [id number] DATE OF BIRTH:  December 02, 1930  DATE OF PROCEDURE:  06/22/2012  PREOPERATIVE DIAGNOSIS: Left inguinal hernia.   POSTOPERATIVE DIAGNOSIS: Left inguinal hernia, left inguinal adenopathy.   PROCEDURE: Left inguinal hernia repair, left groin lymph node biopsy.   SURGEON: Rodena Goldmann, MD  ASSISTANT: [place], [place] student  ANESTHESIA: General.  DESCRIPTION OF PROCEDURE: With the patient in the supine position and after induction of appropriate general anesthesia the patient's abdomen was prepped with ChloraPrep and draped with sterile towels. A transverse incision was made parallel to the inguinal ligament and carried down through the subcutaneous tissue with Bovie electrocautery. Scarpa's fascia was divided with the Bovie. Anterior fascia was identified and opened through the external ring. Cord contents and hernia sac were identified. The hernia sac was dissected free from the cord contents and back to its base in the inguinal ring. It was opened. No bowel or bladder contents were noted. I suture-ligated the sac with 3-0 Vicryl and divided it and then retracted into the preperitoneal space. The floor was reinforced using interrupted sutures of 0 Surgilon adjoining the conjoined tendon to Poupart's ligament. The area was infiltrated with 0.5% Marcaine for postoperative pain control. Cord contents were returned to their anatomic position and the external oblique closed with a running suture of 3-0 Vicryl. In palpating the groin, there was some large adenopathy in the groin which appeared to be pathologic. A lymph node sample was removed using Bovie electrocautery and sent for permanent sections. Scarpa's fascia was closed using running suture of 3-0 Vicryl and the skin was clipped. The area was then dressed sterilely         and the patient was returned to the recovery room having tolerated the procedure well. Sponge, instrument and needle counts were correct x  2 in the operating room. ____________________________ Rodena Goldmann III, MD rle:sb D: 06/22/2012 10:51:50 ET T: 06/22/2012 11:13:37 ET JOB#: [id number]  cc: Micheline Maze, MD, <Dictator> Juline Patch, MD Rodena Goldmann MD ELECTRONICALLY SIGNED 06/23/2012 17:44

## 2014-10-03 NOTE — Discharge Summary (Signed)
PATIENT NAME:  [name] Good, [name] Good MR#:  [id number] DATE OF BIRTH:  1931/05/02  DATE OF ADMISSION:  06/22/2012 DATE OF DISCHARGE:  06/22/2012  BRIEF HISTORY: The patient is a 81 year old gentleman with symptomatic left inguinal hernia, who underwent open inguinal hernia repair today. He also had a lymph node biopsy in the left groin. He did well intraoperatively, but because of his general medical problems, we elected to observe him for several hours. Over the course of the afternoon, he has had no significant nausea and he has reasonable pain control. His wounds look good. There is no evidence of any infection. He has some mild drainage. We will discharge him home this evening to be followed in the office in 7 to 10 days' time. Bathing, activity, and driving instructions were given to the patient.   DISCHARGE MEDICATIONS: Include: 1.  Carvedilol 3.125 mg b.i.d. 2.  Metformin 1000 mg b.i.d. 3.  Furosemide 40 mg b.i.d. 4.  Novolin insulin 70/30, 12 units at supper time and 24 units in the morning. 5.  Pramipexole 1-1/2 tablets at bedtime. 6.  Trazodone 50 mg 2 tablets at bedtime. 7.  Simvastatin 40mg  once a day. 8.  Doxazosin 4 mg once a day. 9.  Losartan 50 mg once a day. 10.  Diltiazem 120-12, 1 capsule b.i.d. 11.  Hydrocodone 325-5 mg p.o. q.4 hours p.r.n.   FINAL DISCHARGE DIAGNOSIS: Left inguinal hernia.   SURGERY: Left inguinal hernia repair.     ____________________________ Micheline Maze, MD rle:AT D: 06/22/2012 18:01:34 ET T: 06/23/2012 13:05:55 ET JOB#: [id number]  cc: Micheline Maze, MD, <Dictator> Juline Patch, MD Rodena Goldmann MD ELECTRONICALLY SIGNED 06/23/2012 17:45

## 2014-10-03 NOTE — Consult Note (Signed)
PATIENT NAME:  [name] Good, [name] Good MR#:  [id number] DATE OF BIRTH:  1931-03-01  DATE OF CONSULTATION:  05/23/2013  REFERRING PHYSICIAN:  Dr. Bridgett Larsson.  CONSULTING PHYSICIAN:  Isaias Cowman, MD PRIMARY CARE PHYSICIAN: Otilio Miu, MD PRIMARY CARDIOLOGIST: Isaias Cowman, MD.   CHIEF COMPLAINT: Cough.  HISTORY OF PRESENT ILLNESS: The patient is an 81 year old gentleman with a history of hypertension, hyperlipidemia, chronic diastolic congestive heart failure, and aortic stenosis. The patient has been in his usual state of health until the past 1 to 2 weeks when he has had progressive cough, wheezing, and shortness of breath. The patient had been treated as an outpatient with antibiotics and inhalers but has had progressive shortness of breath over the last 24 hours and presented to [facility] Emergency Room. His chest x-ray did not reveal any acute cardiopulmonary process, pneumonia, or congestive heart failure. The patient denies fever.   PAST MEDICAL HISTORY:  1.  Hypertension.  2.  Hyperlipidemia.  3.  Aortic stenosis.  4.  Adult-onset diabetes.  5.  History of TIA. 6.  Chronic diastolic congestive heart failure.  7  Chronic peripheral edema.   MEDICATIONS ON ADMISSION: Carvedilol 3.125 mg b.i.d., diltiazem hydrochloride 180 mg b.i.d., furosemide 40 mg daily, simvastatin 20 mg at bedtime, losartan 100 mg daily, Cardura 8 mg daily, metformin 1000 mg b.i.d., trazodone 100 mg at bedtime p.r.n.   SOCIAL HISTORY: The patient is married and resides with his wife. Quit tobacco abuse 28 years ago.   FAMILY HISTORY: No immediate family history of coronary artery disease or myocardial infarction.   REVIEW OF SYSTEMS: CONSTITUTIONAL: No fever or chills.  EYES: No blurry vision.  EARS: No hearing loss.  RESPIRATORY: Cough, wheezing and shortness of breath.  CARDIOVASCULAR: Denies chest pain. He does have chronic pedal edema.  GASTROINTESTINAL: No nausea, vomiting, diarrhea, or constipation.   GENITOURINARY: No dysuria or hematuria.  ENDOCRINE: No polyuria or polydipsia.  MUSCULOSKELETAL: No arthralgias or myalgias.  NEUROLOGICAL: No focal muscle weakness or numbness.  PSYCHOLOGICAL: No depression or anxiety.   PHYSICAL EXAMINATION:  VITAL SIGNS: Blood pressure 165/77, pulse 69, respirations 20, temperature 97.4, pulse ox 96%.  HEENT: Pupils equal, reactive to light and accommodation.  NECK: Supple without thyromegaly.  PULMONARY: Lungs revealed diffuse expiratory wheezes.  CARDIOVASCULAR: Normal JVP. Normal PMI. Regular rate and rhythm. Normal S1, S2. No appreciable gallop, murmur, or rub.  ABDOMEN: Soft and nontender.  EXTREMITIES: Trace to 1+ bilateral pedal edema.  MUSCULOSKELETAL: Normal muscle tone.  NEUROLOGIC: Alert and oriented x3. Motor and sensory both grossly intact.   IMPRESSION: An 81 year old gentleman who presents with respiratory failure, probable chronic obstructive pulmonary disease exacerbation with bronchitis with cough, wheezing and shortness of breath.   RECOMMENDATIONS:  1.  Agree with overall current therapy.  2.  Continue diuresis with furosemide 40 mg b.i.d.  3.  Continue carvedilol and diltiazem hydrochloride.  4.  Would defer further cardiac noninvasive or invasive evaluation at this time.    ____________________________ Isaias Cowman, MD ap:np D: 05/23/2013 16:45:00 ET T: 05/23/2013 16:56:56 ET JOB#: [id number]  cc: Isaias Cowman, MD, <Dictator> Isaias Cowman MD ELECTRONICALLY SIGNED 05/31/2013 8:52

## 2014-10-04 NOTE — H&P (Signed)
PATIENT NAME:  [name] Good, BOESEN MR#:  [id number] DATE OF BIRTH:  January 27, 1931  DATE OF ADMISSION:  03/07/2014  PRIMARY CARE PHYSICIAN:  Ronnald Ramp, MD   REFERRING PHYSICIAN:  Jimmye Norman, MD   CHIEF COMPLAINT: Shortness of breath and leg swelling for 5 days.   HISTORY OF PRESENT ILLNESS: An 81 year old Caucasian male with a history of CHF, hypertension, diabetes, afib, was sent to ED from home due to shortness of breath and leg swelling for 5 days. The patient is alert, awake, oriented, in no acute distress. According to patient's daughter and granddaughter, the patient was fine until 5 days ago. The patient started to have shortness of breath, but no cough, or wheezing. In addition patient has a worsening leg swelling, and gained about 20 pounds for the past 5 days. The patient denies any orthopnea, nocturnal dyspnea. No fever or chills. No chest pain, palpitation. The patient has frequency urination but denies any hematuria, bloody stool. The patient has a history of afib, was on Coumadin, but since the patient had skin surgery Coumadin was taken off according to primary care physician and Dr. Alveria Apley recommendation, patient is now on  Aspirin 325 mg p.o. daily.   PAST MEDICAL HISTORY: Diastolic CHF, TIA, hypertension, diabetes, COPD.   SOCIAL HISTORY: Remote tobacco smoking, no alcohol drinking or illicit drugs.   FAMILY HISTORY: Positive for hypertension, CAD.   SURGICAL HISTORY: Back surgery, appendectomy, and hernia repair.   ALLERGIES: BIAXIN.   HOME MEDICATIONS: Zocor 40 mg 0.5 tablet once a day, ropinirole 4 mg p.o. at bedtime, pramipexole 1 mg 1.5 tablets once a day, potassium 20 mEq p.o. daily, multivitamin 1 tablet once a day, metformin 1000 mg p.o. b.i.d.  mirtazapine 15 mg p.o. daily, losartan 50 mg p.o. daily, Keflex 500 mg p.o. t.i.d., Lantus 50 units subcutaneous once a day at bedtime, insulin regular 5 units subcutaneous before meals t.i.d., Lasix 40 mg p.o. b.i.d., Coreg 3.125 mg p.o.  b.i.d., aspirin 325 mg p.o. daily.   REVIEW OF SYSTEMS:   CONSTITUTIONAL: The patient denies any fever or chills, no headache or dizziness or  weakness.  EYES: No double or blurry vision.  ENT: No postnasal drip, slurred speech or dysphagia.  CARDIOVASCULAR: No chest pain, palpitation, orthopnea or nocturnal dyspnea but has leg edema.  PULMONARY: No cough, sputum, but has shortness of breath, no wheezing, or hemoptysis.  GASTROINTESTINAL: No abdominal pain, nausea, vomiting, diarrhea.  GENITOURINARY: No dysuria, hematuria, or incontinence.  SKIN: No rash or jaundice.  NEUROLOGIC: No syncope, loss of consciousness, or seizure.  ENDOCRINE: Positive for polyuria, no polydipsia, heat, or cold intolerance.  HEMATOLOGY: No easy bruising or bleeding.   VITAL SIGNS: Temperature 98.7, blood pressure 148/78, pulse 62, oxygen saturation 98% on oxygen.     PHYSICAL EXAMINATION:  GENERAL: The patient is alert, awake, oriented, in no acute distress.  HEENT: Pupils round, equal and reactive to light and accommodation, moist oral mucosa, clear oropharynx.  NECK: Supple. No JVD or carotid bruit. No lymphadenopathy. No thyromegaly.  CARDIOVASCULAR: S1 and S2, irregular rate and rhythm, no murmurs or gallops.  PULMONARY: Bilateral air entry. No wheezing or rales. No use of accessory muscle to breathe.  PULMONARY: Bilateral air entry mild bibasilar rales, no wheezing or crackles, no use of accessory muscle to breathe.  ABDOMEN: Soft. No distention or tenderness. No organomegaly. Bowel sounds present.  EXTREMITIES: Bilateral lower extremity edema 3+, no clubbing or cyanosis, no calf tenderness.  SKIN: No rash or jaundice.  NEUROLOGIC: A and  O x 3, no focal deficit, power 5/5, sensory intact.   DIAGNOSTIC DATA: BNP 3038, CK 125, CK-MB 3.3, WBC 5.3, hemoglobin 10.5, platelets 117, glucose 173, BUN 20, creatinine 1.12, electrolytes normal, INR 1.2. Chest x-ray no acute cardiopulmonary abnormality. EKG showed  afib at 74 BPM with right bundle branch block.   IMPRESSION: 1.  Acute on chronic diastolic congestive heart failure.  2.  Hypertension.  3.  Diabetes.  4.  Atrial fibrillation rate controlled.  5.  Anemia.  6.  Thrombocytopenia.   PLAN OF TREATMENT: 1.  The patient will be admitted to telemetry floor. We will increase the Lasix to 40 mg IV every 8 hours, but monitor BMP and magnesium closely, may adjust dose depending on the patient's reaction.  2.  We will start congestive heart failure protocol, monitor input and output, fluid restriction and follow-up BNP.  3.  For hypertension, continue losartan, Coreg, and Cardizem.  4.  For afib continue aspirin and Cardizem.  5.  For diabetes, we will start sliding scale, continue Levemir 15 units at bedtime with NovoLog 5 units subcutaneously t.i.d. before meals.   I discussed the patient's condition and plan of treatment with the patient and the patient's daughter and the granddaughter, the patient wants FULL CODE.   TIME SPENT: About 58 minutes.    ____________________________ Demetrios Loll, MD qc:nt D: 03/07/2014 21:55:18 ET T: 03/07/2014 22:31:42 ET JOB#: [id number]  cc: Demetrios Loll, MD, <Dictator> Demetrios Loll MD ELECTRONICALLY SIGNED 03/10/2014 15:22

## 2014-10-04 NOTE — Discharge Summary (Signed)
PATIENT NAME:  [name] Good, [name] Good MR#:  [id number] DATE OF BIRTH:  07/16/1930  DATE OF ADMISSION:  09/22/2013 DATE OF DISCHARGE:  09/28/2013  ADMITTING DIAGNOSIS:  Acute respiratory failure.   DISCHARGE DIAGNOSES:  1.  Acute respiratory failure due to acute on chronic diastolic congestive heart failure.  2.  Hypokalemia with diuresis. 3.  Insulin-dependent diabetes mellitus.  4.  Atrial fibrillation, now on anticoagulation with Coumadin therapy with therapeutic INR of 2.0.   5.  Chronic obstructive pulmonary disease exacerbation.  6.  R basilar pneumonia, resolved.  7.  Lower extremity swelling due to congestive heart failure.  8.  History of hypertension. 9.  Transient ischemic attack.  10.  Chronic obstructive pulmonary disease.  11.  Diabetes.  12.  Diastolic congestive heart failure.   DISCHARGE CONDITION:  Stable.   DISCHARGE MEDICATIONS:  The patient is to continue his outpatient medications which are:  1.  Coreg 3.125 mg by mouth twice daily.  2.  Furosemide 40 by mouth twice daily.  3.  Simvastatin 10 mg by mouth daily.  4.  Doxazosin 4 mg by mouth daily.  5.  Diltiazem extended release 120 mg by mouth twice daily.  6.  Losartan 100 mg by mouth daily.  7.  Novolin 70/30 28 units subcutaneously in the morning and 12 units subcutaneously at supper depending on blood glucose levels.  8.  Pramipexole 1.5 mg once daily.  9.  Metformin 500 by mouth twice daily.  10.  Multivitamins once daily.  11.  Dulcolax 5 mg once daily as needed.  12.  Pramipexole 1.5 mg by mouth daily as needed.  13.  Warfarin 5 mg by mouth daily.   NEW MEDICATIONS:  1.  Fluticasone salmeterol 250/50 1 puff twice daily.  2.  Tiotropium 1 inhalation once daily.  3.  Combivent Respimat 1 puff 4 times daily as needed.  4.  Potassium chloride 20 mEq once every other day.  5.  Magnesium oxide 400 mg by mouth once daily.  6.  Levofloxacin 500 mg by mouth once daily for four more days.  7.  Minoxidil 2.5 mg by  mouth twice daily.   HOME OXYGEN:  None.   DIET:  2 gram salt, low-fat, low-cholesterol, carbohydrate-controlled diet, regular consistency.    ACTIVITY LIMITATIONS:  As tolerated.   REFERRAL:  The patient is not referred to home health physical therapy.  FOLLOWUP APPOINTMENT:  With Dr. Otilio Miu in two days after discharge.   CONSULTANTS:  Care management, social work, Dr. Saralyn Pilar.   RADIOLOGIC STUDIES:  Chest x-ray portable single view 09/22/2013 revealing lower lung volumes.  There is new patchy right basilar atelectasis or early infiltrates, cardiomegaly with vascular congestion and suspected pulmonary arterial hypertension.  Repeated chest x-ray PA and lateral 09/24/2013 revealing mild persistent airspace density over the lower lobes on the lateral film which may be due to atelectasis or infection.  Evidence of a small amount of bilateral pleural fluid.  Also cardiomegaly.  Repeated chest x-ray PA and lateral 09/28/2013 revealed cardiomegaly, but no active infiltrate.  Doppler ultrasound of the left lower extremity 09/24/2013 revealed no evidence of deep vein thrombosis.   HOSPITAL COURSE:  The patient is an 81 year old Caucasian male with past medical history significant for history of COPD, history of diastolic CHF who presents to the hospital with complaints of lower extremity edema.  Please refer to Dr. Samantha Crimes admission note on 09/22/2013.  On arrival to the hospitalization, the patient's temperature was 97.9, pulse was 69.  The patient was in A. Fib, respiration rate was 18, blood pressure 148/67, saturation was 94% on room air, 99% on 3 liters of oxygen through nasal cannula.  Physical exam revealed diminished breath sounds bilaterally.  No wheezes, rubs, or rhonchi.  Good effort to breathe and chest was nontender to palpation.  The patient did have lower extremity swelling.  He was admitted to the hospital for further evaluation.  His chest x-ray showed new patchy R basilar  atelectasis versus possible pneumonia.  He was admitted with diagnosis of congestive heart failure as well as COPD exacerbation.  He was initiated on inhalers, diuretics IV and antibiotics.  With this therapy significantly improved, by the day of discharge, 09/28/2013 he was able to be weaned off oxygen and his oxygenation remained stable even when you walked on room air.  He is to continue inhalation therapy with Advair as well as tiotropium and Combivent.  He is to continue antibiotic therapy for four more days to complete course, however as mentioned above the patient's chest x-ray has improved significantly revealing no obvious pneumonia; however since he improved significantly with this therapy we felt that he needs to continue antibiotic therapy to complete course.   In regards to congestive heart failure, the patient was initiated on high doses of blood pressure medications adding minoxidil.  He was continued on IV Lasix with which he diuresed significantly.  Over the course of his stay in the hospital time he diuresed 7.5 liters and his oxygenation was much better.  The patient was advised to continue to manage his salt intake and continue his medications to control his blood pressure since his blood pressure was very poorly controlled with those medications.  It is recommended to follow the patient's blood pressure readings very closely and make decisions about tapering blood pressure medications if needed.    In regards to diabetes mellitus, the patient was hypoglycemic while he was in the hospital, very likely due to his diet in the hospital where we had to cut down his insulin use while he was in the hospital; however, upon discharge from the hospital, the patient is to resume his usual home insulin doses.   In regards to atrial fibrillation, the patient is to continue his medications to control heart rate.  He is on Coumadin now.  He is therapeutic.  On the day of discharge, the patient's Pro Time  was checked and was found to be 22.4 with INR of 2.0.  It is recommended to follow the patient's Pro Time closely since he is on a few more days of Levaquin which may increase if Pro Time significantly.    Regarding COPD exacerbation, the patient as mentioned above is to continue inhalation therapy.  He was initiated also on incentive spirometer which helped him significantly with atelectasis.   In regards to lower extremity swelling, the patient was diuresed and his lower extremity swelling significantly improved.  His Doppler ultrasound was negative for DVT.    For his chronic medical problems, the patient is to continue his outpatient management.  The patient is being discharged in stable condition with the above-mentioned medications and follow-up.   TIME SPENT:  40 minutes.    ____________________________ Theodoro Grist, MD rv:ea D: 09/28/2013 16:52:00 ET T: 09/29/2013 03:15:15 ET JOB#: [id number]  cc: Juline Patch, MD Theodoro Grist, MD, <Dictator>  Tomie Elko MD ELECTRONICALLY SIGNED 10/12/2013 18:39

## 2014-10-04 NOTE — Consult Note (Signed)
PATIENT NAME:  [name] Good, [name] Good MR#:  [id number] DATE OF BIRTH:  1930/09/15  DATE OF CONSULTATION:  09/23/2013  REFERRING PHYSICIAN:  Dr. Lavetta Nielsen CONSULTING PHYSICIAN:  Isaias Cowman, MD  PRIMARY CARE PHYSICIAN: Otilio Miu, MD.   CHIEF COMPLAINT: Swelling.   REASON FOR CONSULTATION: Consultation requested for evaluation of congestive heart failure.   HISTORY OF PRESENT ILLNESS: The patient is an 81 year old gentleman with known history of hypertension, chronic diastolic congestive heart failure with known normal left ventricular function. The patient has a history of hypertension, insulin-requiring diabetes and COPD. The patient reports a 2 week history of increasing peripheral edema associated with shortness of breath. More recently, the patient started developing orthopnea, sleeping in a recliner, PND and becoming short of breath with only minimal steps. The patient was sent to [facility] Emergency Room and was admitted with diagnosis of acute on chronic diastolic congestive heart failure. Symptoms have improved after initial diuresis with intravenous furosemide. Admission labs were notable for a negative troponin. The patient currently denies chest pain. He reports his breathing has improved.   PAST MEDICAL HISTORY:  1.  Chronic diastolic congestive heart failure.  2.  Previous hospitalizations for acute on chronic diastolic congestive heart failure.  3.  Hypertension.  4.  Diabetes.  5.  History of transient ischemic attacks.  6.  COPD.   MEDICATIONS: Losartan 50 mg daily, doxazosin 4 mg at bedtime, diltiazem 120 mg b.i.d., carvedilol 3.125 mg b.i.d., furosemide 40 mg b.i.d., potassium 10 mEq daily, warfarin 5 mg daily, trazodone 50 mg 2 tablets at bedtime, Novolin 70/30, 28 units q.a.m. and 15 units q.p.m., simvastatin 40 mg daily, Advair 250/50, 1 puff b.i.d. and Spiriva 18 mcg inhalation daily.   SOCIAL HISTORY: The patient currently lives alone. He denies tobacco or EtOH abuse.    FAMILY HISTORY: Positive for coronary artery disease.   REVIEW OF SYSTEMS: CONSTITUTIONAL: No fever or chills. EYES: No blurry vision.  EARS: No hearing loss. RESPIRATORY: Shortness of breath as described above. CARDIOVASCULAR: The patient denies chest pain. He has had peripheral edema, orthopnea and PND. GASTROINTESTINAL: No nausea, vomiting, or diarrhea. GENITOURINARY: No dysuria or hematuria. ENDOCRINE: No polyuria or polydipsia. MUSCULOSKELETAL: No arthralgias or myalgias. NEUROLOGICAL: No focal muscle weakness or numbness. PSYCHOLOGICAL: No depression or anxiety.   PHYSICAL EXAMINATION:  VITAL SIGNS: Blood pressure 133/61, pulse 66, respirations 24, temperature 98.6, pulse oximetry 90%.  HEENT: Pupils equal, reactive to light and accommodation.  NECK: Supple without thyromegaly.  LUNGS: Clear.  HEART: Normal JVP. Normal PMI. Regular rate and rhythm. Normal S1, S2. No appreciable gallop, murmur, or rub.  ABDOMEN: Soft and nontender. Pulses were intact bilaterally.  MUSCULOSKELETAL: Normal muscle tone.  NEUROLOGIC: The patient is alert and oriented x 3. Motor and sensory both grossly intact.   IMPRESSION: An 81 year old gentleman with known chronic diastolic congestive heart failure, normal left ventricular function, who presents with acute on chronic diastolic congestive heart failure with peripheral edema, exertional dyspnea, orthopnea and PND, which appears to have improved after initial diuresis. The patient has ruled out for myocardial infarction with negative troponin. The patient denies chest pain. Upon questioning, the patient apparently has been noncompliant with a low sodium diet, eating bacon and sausage.   RECOMMENDATIONS:  1.  I agree with overall current therapy.  2.  Continue diuresis.  3.  Carefully monitor renal function with BUN creatinine.  4.  I had a lengthy discussion about the importance of adhering to a low sodium diet.  5.  I  would defer further cardiac  diagnostics at this time.  ____________________________ Isaias Cowman, MD ap:aw D: 09/23/2013 13:02:38 ET T: 09/23/2013 13:09:56 ET JOB#: [id number]  cc: Isaias Cowman, MD, <Dictator> Isaias Cowman MD ELECTRONICALLY SIGNED 10/15/2013 12:46

## 2014-10-04 NOTE — Discharge Summary (Signed)
PATIENT NAME:  [name] Good, [name] Good MR#:  [id number] DATE OF BIRTH:  10/26/1930  DATE OF ADMISSION:  03/07/2014 DATE OF DISCHARGE:  03/09/2014  DISCHARGE DIAGNOSES: 1. Acute on chronic diastolic congestive heart failure.  2. Insulin-dependent diabetes mellitus.  3. Hypertension.   DISCHARGE MEDICATIONS:  1. Coreg 3.125 mg twice a day.  2. Lasix 40 mg twice a day.  3. Simvastatin 40 mg 1/2 tablet daily.  4. Doxazosin 4 mg oral once a day.  5. Pramipexole 1 mg oral once a day.  6. Multivitamin 1 tablet daily.  7. Diltiazem 180 mg 2 times a day.  8. Losartan 50 mg daily.  9. Metformin 1000 mg oral 2 times a day.  10. Insulin 5 units subcutaneous 3 times a day regular before meals.  11. Remeron 15 mg 1.5 tablets daily.  12. Potassium chloride 20 mEq daily.  13. Ropinirole 2 mg oral once a day.  14. Aspirin 325 mg oral once a day.  15. Insulin glargine 13 units subcutaneous once a day.  16. Metolazone 2.5 mg oral every other day.   DISCHARGE INSTRUCTIONS: Low-sodium carbohydrate-controlled diet, daily fluids less than 2 liters. The patient has been instructed to weigh himself every day, take an extra dose of Lasix if there is more than a 2-pound weight gain. Follow up with Dr. Nehemiah Massed in 1 to 2 weeks.   IMAGING STUDIES: Showed chest x-ray with no acute abnormalities.   ADMITTING HISTORY PHYSICAL: Please see detailed history and physical dictated by Dr. Bridgett Larsson. In brief, an 81 year old Caucasian male patient brought into the hospital complaining of shortness of breath, leg swelling, was found to have CHF and was admitted to the hospitalist service.   HOSPITAL COURSE: The patient was admitted on the telemetry floor, started on aggressive diuresis with Lasix 40 mg IV 3 times a day. He has diuresed well. Later this was reduced to 2 times a day. He is a negative 5.5 liters in total. His lower extremity swelling has resolved. Creatinine is stable. The patient feels significantly better. No crackles on  lung examination prior to discharge. Heart sounds are S1, S2, without any murmurs. The patient is alert and oriented x 3 ambulated well, not on oxygen, and is being discharged home.   At the time of discharge, his Lasix stays the same, but he has been added on metolazone every other day along with instructions to keep his daily fluids less than 2 liters, and low-salt diet.   The patient had some mild hypoglycemia in the hospital for which his insulin dose has been reduced.   TIME SPENT ON DAY OF DISCHARGE IN DISCHARGE ACTIVITY: Was 45 minutes.    ____________________________ Leia Alf Marlis Oldaker, MD srs:JT D: 03/13/2014 08:32:18 ET T: 03/13/2014 11:31:53 ET JOB#: [id number]  cc: Alveta Heimlich R. Darvin Neighbours, MD, <Dictator> Corey Skains, MD Neita Carp MD ELECTRONICALLY SIGNED 03/24/2014 10:45

## 2014-10-04 NOTE — H&P (Signed)
PATIENT NAME:  Good Good MR#:  [id number] DATE OF BIRTH:  Mar 30, 1931  DATE OF ADMISSION:  09/22/2013  REFERRING PHYSICIAN: Dr. Jimmye Norman  PRIMARY CARE PHYSICIAN: Dr. Otilio Miu.   CARDIOLOGIST: Dr. Nehemiah Massed.   CHIEF COMPLAINT: Leg edema.   HISTORY OF PRESENT ILLNESS: An 81 year old Caucasian gentleman with a past medical history of diastolic congestive heart failure, ejection fraction of 55% performed in December 2014, diabetes, insulin-requiring, hypertension, chronic obstructive pulmonary disease, non-O2 dependent, who is presenting with lower extremity edema. Describes a two-week duration of worsening lower extremity edema with associated dyspnea on exertion. Only able to walk a few steps before becoming short of breath, as well as orthopnea, sleeping upright in a recliner and occasional PND. However, denies any chest pain, palpitations, cough, fevers, chills, or further symptomatology. He was recently on vacation, and admits to dietary noncompliance. However, he states he is compliant with all of his medications. Currently, he is without complaints.   REVIEW OF SYSTEMS:   CONSTITUTIONAL: Denies fever, fatigue, weakness.  EYES: Denies blurred vision, double vision, eye pain.  EARS, NOSE, THROAT: Denies tinnitus, ear pain, hearing loss.  RESPIRATORY: Denies cough or wheeze. Positive for shortness of breath.  CARDIOVASCULAR: Positive for lower extremity edema, orthopnea, PND. Denies chest pain.  GASTROINTESTINAL: Denies nausea, vomiting, diarrhea, abdominal pain.  GENITOURINARY: Denies dysuria, hematuria.  ENDOCRINE: Denies nocturia or thyroid problems.  HEMATOLOGIC AND LYMPHATIC: Denies easy bruising, bleeding.  SKIN: Denies rash or lesions.  MUSCULOSKELETAL: Denies pain in the neck, back, shoulders, hips NEUROLOGIC: Denies paralysis, paresthesias.  PSYCHIATRIC: Denies anxiety or depressive symptoms.  Otherwise, full review of systems performed by me is negative.   PAST MEDICAL  HISTORY: Diabetes, hypertension, transient ischemic attack, chronic obstructive pulmonary disease, diastolic congestive heart failure with ejection fraction of 55%.   SOCIAL HISTORY: Remote tobacco smoking. Now uses chewing tobacco. Denies any alcohol or drug usage.   FAMILY HISTORY: Positive for coronary artery disease and hypertension.   ALLERGIES: BIAXIN.   HOME MEDICATIONS: Losartan 50 mg p.o. daily, doxazosin 4 mg p.o. at bedtime, diltiazem 120 mg p.o. b.i.d., trazodone 50 mg 2 tabs p.o. at bedtime, Novolin 70/30, 28 units in the morning, 15 units in the evening, simvastatin 40 mg p.o. daily, pramipexole 1 mg p.o. at bedtime, Coreg 3.125 p.o. b.i.d., Advair 250/50 mcg 1 puff b.i.d., Spiriva 18 mcg inhalation daily, Lasix 40 mg p.o. b.i.d., potassium 10 mEq p.o. daily, recently started on warfarin 5 mg p.o. daily.   PHYSICAL EXAMINATION: VITAL SIGNS: Temperature 97.9, heart rate 69 in atrial fibrillation, respirations 18, blood pressure 148/67, saturating 94% on room air and 99% on 3 liters nasal cannula. Weight 91.6 kg, BMI 29.9.  GENERAL: Well-nourished, well-developed, Caucasian gentleman, currently in no acute distress.  HEENT: Head normocephalic, atraumatic. Eyes: Pupils equal, round and reactive to light. Extraocular muscles intact. No scleral icterus. Mouth: Moist mucous membranes. Dentition intact. No abscess noted. Ears, nose, and throat are clear, without exudates. No external lesions.  NECK: Supple. No thyromegaly. No nodules.  PULMONARY: Diminished breath sounds at the bases; however, no wheezes, rubs or rhonchi. No use of accessory muscles. Good respiratory effort. Chest nontender to palpation.  CARDIOVASCULAR: S1, S2. Irregular rate, irregular rhythm. No murmurs, rubs. There is a 3/6 systolic ejection murmur. 2+ pedal edema to the hips bilaterally. Pedal pulses 2+ bilaterally.  GASTROINTESTINAL: Soft, nontender, nondistended. No masses. Positive bowel sounds. No  hepatosplenomegaly. MUSCULOSKELETAL: No swelling, clubbing, edema. Range of motion full in all extremities.  NEUROLOGIC: Cranial nerves  II through XII intact. No gross focal neurological deficits. Sensation intact. Reflexes intact.  SKIN: No ulcerations, lesions, rash, cyanosis. Skin warm, dry. Turgor intact.  PSYCHIATRIC: Mood and affect within normal limits. The patient awake, alert, oriented x3. Insight and judgment are intact.   LABORATORY DATA: EKG performed, revealing atrial fibrillation, rate controlled in the 60s. No ST or T wave abnormalities. There is a chest x-ray performed, with low lung volumes and right basilar atelectasis. There is some pulmonary congestion noted. Remainder of laboratory data: Sodium 142, potassium 3.3, chloride 103, bicarbonate 33, BUN 23, creatinine 0.95, glucose 114. Troponin I less than 0.02. WBC 5.6, hemoglobin 11.2, platelets of 126. INR of 1.3.   ASSESSMENT AND PLAN: An 81 year old gentleman with a history of diastolic congestive heart failure, presenting with lower extremity and shortness of breath.  1.  Acute on chronic diastolic congestive heart failure exacerbation. Will follow ins and outs and daily weights. Place on telemetry. Trend cardiac enzymes x3. Check TSH level. This is likely related to dietary noncompliance. Continue diuresis with Lasix. We will do 40 mg IV b.i.d. Consult cardiology. He follows currently with Dr. Nehemiah Massed at [facility]. Continue with his Coreg and further home medications.  2.  Atrial fibrillation, rate controlled on Cardizem, though subtherapeutic INR. Redose warfarin.  3.  Diabetes. Continue 70/30 insulin. Hold oral agents. Add insulin sliding scale.  4.  Chronic obstructive pulmonary disease. Continue Advair. Provide DuoNeb treatments q.4 hours as well as supplemental oxygen to keep oxygen saturation greater than 92%.  5.  Deep venous thrombosis prophylaxis with heparin subcutaneous while he is subtherapeutic on warfarin.    CODE STATUS: The patient is full code.   TIME SPENT: 45 minutes.   ____________________________ Aaron Mose. Chelan Heringer, MD dkh:cg D: 09/22/2013 22:38:19 ET T: 09/22/2013 23:54:10 ET JOB#: [id number]  cc: Aaron Mose. Kit Mollett, MD, <Dictator> Denina Rieger Woodfin Ganja MD ELECTRONICALLY SIGNED 09/24/2013 2:48

## 2014-10-06 DIAGNOSIS — M5441 Lumbago with sciatica, right side: Secondary | ICD-10-CM | POA: Diagnosis not present

## 2014-10-06 DIAGNOSIS — R600 Localized edema: Secondary | ICD-10-CM | POA: Diagnosis not present

## 2014-10-06 DIAGNOSIS — N393 Stress incontinence (female) (male): Secondary | ICD-10-CM | POA: Diagnosis not present

## 2014-10-06 DIAGNOSIS — M5442 Lumbago with sciatica, left side: Secondary | ICD-10-CM | POA: Diagnosis not present

## 2014-10-10 ENCOUNTER — Emergency Department
Admit: 2014-10-10 | Disposition: A | Discharge status: Home / Self Care | Payer: Self-pay | Admitting: Emergency Medicine

## 2014-10-10 DIAGNOSIS — I10 Essential (primary) hypertension: Secondary | ICD-10-CM | POA: Diagnosis not present

## 2014-10-10 DIAGNOSIS — I509 Heart failure, unspecified: Secondary | ICD-10-CM | POA: Diagnosis not present

## 2014-10-10 DIAGNOSIS — R609 Edema, unspecified: Secondary | ICD-10-CM | POA: Diagnosis not present

## 2014-10-10 DIAGNOSIS — E119 Type 2 diabetes mellitus without complications: Secondary | ICD-10-CM | POA: Diagnosis not present

## 2014-10-10 DIAGNOSIS — R0602 Shortness of breath: Secondary | ICD-10-CM | POA: Diagnosis not present

## 2014-10-10 DIAGNOSIS — Z79899 Other long term (current) drug therapy: Secondary | ICD-10-CM | POA: Diagnosis not present

## 2014-10-10 DIAGNOSIS — Z794 Long term (current) use of insulin: Secondary | ICD-10-CM | POA: Diagnosis not present

## 2014-10-10 LAB — BASIC METABOLIC PANEL
Anion Gap: 11 (ref 7–16)
BUN: 26 mg/dL — ABNORMAL HIGH
CALCIUM: 8.5 mg/dL — AB
CHLORIDE: 103 mmol/L
CO2: 27 mmol/L
CREATININE: 1.22 mg/dL
EGFR (African American): >60
GFR CALC NON AF AMER: 55 — AB
Glucose: 115 mg/dL — ABNORMAL HIGH
Potassium: 3.1 mmol/L — ABNORMAL LOW
Sodium: 141 mmol/L

## 2014-10-10 LAB — CBC
HCT: 32.4 % — ABNORMAL LOW (ref 40.0–52.0)
HGB: 10.2 g/dL — ABNORMAL LOW (ref 13.0–18.0)
MCH: 27.1 pg (ref 26.0–34.0)
MCHC: 31.4 g/dL — ABNORMAL LOW (ref 32.0–36.0)
MCV: 86 fL (ref 80–100)
PLATELETS: 103 10*3/uL — AB (ref 150–440)
RBC: 3.75 10*6/uL — AB (ref 4.40–5.90)
RDW: 17.4 % — ABNORMAL HIGH (ref 11.5–14.5)
WBC: 4.9 10*3/uL (ref 3.8–10.6)

## 2014-10-10 LAB — TROPONIN I: Troponin-I: 0.05 ng/mL — ABNORMAL HIGH

## 2014-10-10 LAB — PRO B NATRIURETIC PEPTIDE: B-Type Natriuretic Peptide: 477 pg/mL — ABNORMAL HIGH

## 2014-10-13 DIAGNOSIS — R600 Localized edema: Secondary | ICD-10-CM | POA: Diagnosis not present

## 2014-10-13 DIAGNOSIS — R0609 Other forms of dyspnea: Secondary | ICD-10-CM | POA: Diagnosis not present

## 2014-10-13 DIAGNOSIS — I5032 Chronic diastolic (congestive) heart failure: Secondary | ICD-10-CM | POA: Diagnosis not present

## 2014-10-20 DIAGNOSIS — I87312 Chronic venous hypertension (idiopathic) with ulcer of left lower extremity: Secondary | ICD-10-CM | POA: Diagnosis not present

## 2014-10-20 DIAGNOSIS — R600 Localized edema: Secondary | ICD-10-CM | POA: Diagnosis not present

## 2014-11-20 DIAGNOSIS — I872 Venous insufficiency (chronic) (peripheral): Secondary | ICD-10-CM | POA: Diagnosis not present

## 2014-11-20 DIAGNOSIS — I482 Chronic atrial fibrillation: Secondary | ICD-10-CM | POA: Diagnosis not present

## 2014-11-20 DIAGNOSIS — I350 Nonrheumatic aortic (valve) stenosis: Secondary | ICD-10-CM | POA: Diagnosis not present

## 2014-11-20 DIAGNOSIS — I10 Essential (primary) hypertension: Secondary | ICD-10-CM | POA: Diagnosis not present

## 2015-01-25 ENCOUNTER — Other Ambulatory Visit: Payer: Self-pay | Admitting: Family Medicine

## 2015-01-25 DIAGNOSIS — G2581 Restless legs syndrome: Secondary | ICD-10-CM

## 2015-02-02 ENCOUNTER — Encounter: Payer: Self-pay | Admitting: Family Medicine

## 2015-02-02 ENCOUNTER — Ambulatory Visit (INDEPENDENT_AMBULATORY_CARE_PROVIDER_SITE_OTHER): Payer: Medicare Other | Admitting: Family Medicine

## 2015-02-02 VITALS — BP 120/64 | HR 64 | Ht 68.0 in | Wt 155.0 lb

## 2015-02-02 DIAGNOSIS — R634 Abnormal weight loss: Secondary | ICD-10-CM | POA: Diagnosis not present

## 2015-02-02 DIAGNOSIS — R1012 Left upper quadrant pain: Secondary | ICD-10-CM | POA: Insufficient documentation

## 2015-02-02 LAB — POCT URINALYSIS DIPSTICK
Bilirubin, UA: NEGATIVE
GLUCOSE UA: NEGATIVE
KETONES UA: NEGATIVE
Leukocytes, UA: NEGATIVE
Nitrite, UA: NEGATIVE
Protein, UA: NEGATIVE
RBC UA: NEGATIVE
SPEC GRAV UA: 1.02
Urobilinogen, UA: 0.2
pH, UA: 5

## 2015-02-02 LAB — HEMOCCULT GUIAC POC 1CARD (OFFICE): Fecal Occult Blood, POC: NEGATIVE

## 2015-02-02 MED ORDER — METRONIDAZOLE 500 MG PO TABS: 500.0000 mg | ORAL_TABLET | Freq: Three times a day (TID) | ORAL | Status: DC

## 2015-02-02 MED ORDER — CIPROFLOXACIN HCL 500 MG PO TABS: 500.0000 mg | ORAL_TABLET | Freq: Two times a day (BID) | ORAL | Status: DC

## 2015-02-02 NOTE — Progress Notes (Signed)
Name: [name] Good   MRN: [id number]    DOB: 09-29-1930   Date:02/02/2015       Progress Note  Subjective  Chief Complaint  Chief Complaint  Patient presents with  . Flank Pain    Flank Pain This is a recurrent problem. The current episode started more than 1 month ago. The problem occurs constantly. The problem has been waxing and waning since onset. The pain is present in the thoracic spine. The quality of the pain is described as cramping and stabbing. The pain is mild. The pain is worse during the day. The symptoms are aggravated by sitting, lying down and bending. Stiffness is present all day. Associated symptoms include abdominal pain and weight loss. Pertinent negatives include no bladder incontinence, bowel incontinence, chest pain, dysuria, fever, headaches, leg pain, numbness, paresis, paresthesias, pelvic pain, perianal numbness, tingling or weakness. Risk factors include history of cancer (colon). He has tried nothing for the symptoms. The treatment provided no relief.  Abdominal Pain This is a recurrent problem. The current episode started more than 1 month ago. The onset quality is gradual. The problem occurs constantly. The problem has been gradually worsening. The pain is located in the LUQ. The abdominal pain radiates to the left flank. Associated symptoms include weight loss. Pertinent negatives include no anorexia, arthralgias, belching, constipation, diarrhea, dysuria, fever, frequency, headaches, hematochezia, hematuria, melena, myalgias or nausea. The pain is aggravated by certain positions. The pain is relieved by nothing. He has tried nothing for the symptoms. The treatment provided mild relief. Prior diagnostic workup includes CT scan (? chest). His past medical history is significant for colon cancer. There is no history of abdominal surgery or GERD.  Other This is a new (weight loss) problem. The current episode started more than 1 month ago. The problem has been  gradually worsening. Associated symptoms include abdominal pain. Pertinent negatives include no anorexia, arthralgias, chest pain, chills, coughing, fever, headaches, myalgias, nausea, neck pain, numbness, rash, sore throat or weakness. He has tried nothing for the symptoms.    No problem-specific assessment & plan notes found for this encounter.   Past Medical History  Diagnosis Date  . Diabetes mellitus without complication   . Hyperlipidemia   . Hypertension   . Stroke   . Arthritis   . Cancer   . CHF (congestive heart failure)   . Venous insufficiency   . CAD (coronary artery disease)     Past Surgical History  Procedure Laterality Date  . Shoulder surgery Right   . Back surgery    . Hernia repair    . Colonoscopy w/ biopsies and manometry catheter placement  2014    normal  . Skin cancer excision      nose, ear, arm, and face  . Eye surgery Bilateral     cataract    History reviewed. No pertinent family history.  Social History   Social History  . Marital Status: Married    Spouse Name: N/A  . Number of Children: N/A  . Years of Education: N/A   Occupational History  . Not on file.   Social History Main Topics  . Smoking status: Former Research scientist (life sciences)  . Smokeless tobacco: Current User    Types: Chew  . Alcohol Use: No  . Drug Use: No  . Sexual Activity: No   Other Topics Concern  . Not on file   Social History Narrative  . No narrative on file    Not on File  Review of Systems  Constitutional: Positive for weight loss. Negative for fever, chills and malaise/fatigue.  HENT: Negative for ear discharge, ear pain and sore throat.   Eyes: Negative for blurred vision.  Respiratory: Negative for cough, sputum production, shortness of breath and wheezing.   Cardiovascular: Negative for chest pain, palpitations and leg swelling.  Gastrointestinal: Positive for abdominal pain. Negative for heartburn, nausea, diarrhea, constipation, blood in stool, melena,  hematochezia, anorexia and bowel incontinence.  Genitourinary: Positive for flank pain. Negative for bladder incontinence, dysuria, urgency, frequency, hematuria and pelvic pain.  Musculoskeletal: Negative for myalgias, back pain, joint pain, arthralgias and neck pain.  Skin: Negative for rash.  Neurological: Negative for dizziness, tingling, sensory change, focal weakness, weakness, numbness, headaches and paresthesias.  Endo/Heme/Allergies: Negative for environmental allergies and polydipsia. Does not bruise/bleed easily.  Psychiatric/Behavioral: Negative for depression and suicidal ideas. The patient is not nervous/anxious and does not have insomnia.      Objective  Filed Vitals:   02/02/15 1030  BP: 120/64  Pulse: 64  Height: 5\' 8"  (1.727 m)  Weight: 155 lb (70.308 kg)    Physical Exam  Constitutional: He is oriented to person, place, and time and well-developed, well-nourished, and in no distress.  HENT:  Head: Normocephalic.  Right Ear: External ear normal.  Left Ear: External ear normal.  Nose: Nose normal.  Mouth/Throat: Oropharynx is clear and moist.  Eyes: Conjunctivae and EOM are normal. Pupils are equal, round, and reactive to light. Right eye exhibits no discharge. Left eye exhibits no discharge. No scleral icterus.  Neck: Normal range of motion. Neck supple. No JVD present. No tracheal deviation present. No thyromegaly present.  Cardiovascular: Normal rate, regular rhythm, normal heart sounds and intact distal pulses.  Exam reveals no gallop and no friction rub.   No murmur heard. Pulmonary/Chest: Breath sounds normal. No respiratory distress. He has no wheezes. He has no rales.  Abdominal: Soft. Bowel sounds are normal. He exhibits no mass. There is no hepatosplenomegaly. There is no tenderness. There is no rebound, no guarding and no CVA tenderness.  Genitourinary: Rectum normal and prostate normal. Rectal exam shows no mass and no tenderness.  Musculoskeletal:  Normal range of motion. He exhibits no edema or tenderness.  Lymphadenopathy:    He has no cervical adenopathy.  Neurological: He is alert and oriented to person, place, and time. He has normal sensation, normal strength, normal reflexes and intact cranial nerves. No cranial nerve deficit.  Skin: Skin is warm. No rash noted.  Psychiatric: Mood and affect normal.      Assessment & Plan  Problem List Items Addressed This Visit      Other   Abdominal pain, acute, left upper quadrant - Primary   Relevant Medications   ciprofloxacin (CIPRO) 500 MG tablet   metroNIDAZOLE (FLAGYL) 500 MG tablet   Other Relevant Orders   CBC   POCT Occult Blood Stool (Completed)   POCT Urinalysis Dipstick    Other Visit Diagnoses    Weight loss, non-intentional        Relevant Orders    CBC    POCT Occult Blood Stool (Completed)    POCT Urinalysis Dipstick         Dr. Otilio Miu [facility]  02/02/2015

## 2015-02-03 LAB — CBC
HEMOGLOBIN: 12.2 g/dL — AB (ref 12.6–17.7)
Hematocrit: 36.9 % — ABNORMAL LOW (ref 37.5–51.0)
MCH: 28.6 pg (ref 26.6–33.0)
MCHC: 33.1 g/dL (ref 31.5–35.7)
MCV: 86 fL (ref 79–97)
Platelets: 174 10*3/uL (ref 150–379)
RBC: 4.27 x10E6/uL (ref 4.14–5.80)
RDW: 19.8 % — ABNORMAL HIGH (ref 12.3–15.4)
WBC: 7.3 10*3/uL (ref 3.4–10.8)

## 2015-02-26 ENCOUNTER — Encounter: Payer: Self-pay | Admitting: Family Medicine

## 2015-02-26 ENCOUNTER — Ambulatory Visit (INDEPENDENT_AMBULATORY_CARE_PROVIDER_SITE_OTHER): Payer: Medicare Other | Admitting: Family Medicine

## 2015-02-26 VITALS — BP 120/66 | HR 74 | Ht 68.0 in | Wt 159.0 lb

## 2015-02-26 DIAGNOSIS — R1012 Left upper quadrant pain: Secondary | ICD-10-CM

## 2015-02-26 LAB — HEMOCCULT GUIAC POC 1CARD (OFFICE): FECAL OCCULT BLD: NEGATIVE

## 2015-02-26 NOTE — Progress Notes (Signed)
Name: [name] Good   MRN: [id number]    DOB: 1930/06/24   Date:02/26/2015       Progress Note  Subjective  Chief Complaint  Chief Complaint  Patient presents with  . Flank Pain    L) side- started having a pain approx 2 weeks ago off and on    Flank Pain This is a new problem. The current episode started more than 1 month ago. The problem occurs daily. The problem has been waxing and waning since onset. The quality of the pain is described as stabbing. The pain does not radiate. The pain is at a severity of 7/10. The pain is moderate. The symptoms are aggravated by bending, coughing and twisting (placement of seatbelt). Associated symptoms include abdominal pain. Pertinent negatives include no bladder incontinence, bowel incontinence, chest pain, dysuria, fever, headaches, leg pain, numbness, paresis, paresthesias, pelvic pain, perianal numbness, tingling, weakness or weight loss. (Constipation)    No problem-specific assessment & plan notes found for this encounter.   Past Medical History  Diagnosis Date  . Diabetes mellitus without complication   . Hyperlipidemia   . Hypertension   . Stroke   . Arthritis   . Cancer   . CHF (congestive heart failure)   . Venous insufficiency   . CAD (coronary artery disease)     Past Surgical History  Procedure Laterality Date  . Shoulder surgery Right   . Back surgery    . Hernia repair    . Colonoscopy w/ biopsies and manometry catheter placement  2014    normal  . Skin cancer excision      nose, ear, arm, and face  . Eye surgery Bilateral     cataract    No family history on file.  Social History   Social History  . Marital Status: Married    Spouse Name: N/A  . Number of Children: N/A  . Years of Education: N/A   Occupational History  . Not on file.   Social History Main Topics  . Smoking status: Former Research scientist (life sciences)  . Smokeless tobacco: Current User    Types: Chew  . Alcohol Use: No  . Drug Use: No  . Sexual  Activity: No   Other Topics Concern  . Not on file   Social History Narrative    Not on File   Review of Systems  Constitutional: Negative for fever, chills, weight loss and malaise/fatigue.  HENT: Negative for ear discharge, ear pain and sore throat.   Eyes: Negative for blurred vision.  Respiratory: Negative for cough, sputum production, shortness of breath and wheezing.   Cardiovascular: Negative for chest pain, palpitations and leg swelling.  Gastrointestinal: Positive for abdominal pain and constipation. Negative for heartburn, nausea, diarrhea, blood in stool, melena and bowel incontinence.  Genitourinary: Positive for flank pain. Negative for bladder incontinence, dysuria, urgency, frequency, hematuria and pelvic pain.  Musculoskeletal: Negative for myalgias, back pain, joint pain and neck pain.  Skin: Negative for rash.  Neurological: Negative for dizziness, tingling, sensory change, focal weakness, weakness, numbness, headaches and paresthesias.  Endo/Heme/Allergies: Negative for environmental allergies and polydipsia. Does not bruise/bleed easily.  Psychiatric/Behavioral: Negative for depression and suicidal ideas. The patient is not nervous/anxious and does not have insomnia.      Objective  Filed Vitals:   02/26/15 1334  BP: 120/66  Pulse: 74  Height: 5\' 8"  (1.727 m)  Weight: 159 lb (72.122 kg)    Physical Exam  Constitutional: He is oriented to person, place,  and time and well-developed, well-nourished, and in no distress.  HENT:  Head: Normocephalic.  Right Ear: External ear normal.  Left Ear: External ear normal.  Nose: Nose normal.  Mouth/Throat: Oropharynx is clear and moist.  Eyes: Conjunctivae and EOM are normal. Pupils are equal, round, and reactive to light. Right eye exhibits no discharge. Left eye exhibits no discharge. No scleral icterus.  Neck: Normal range of motion. Neck supple. No JVD present. No tracheal deviation present. No thyromegaly  present.  Cardiovascular: Normal rate, regular rhythm, normal heart sounds and intact distal pulses.  Exam reveals no gallop and no friction rub.   No murmur heard. Pulmonary/Chest: Breath sounds normal. No respiratory distress. He has no wheezes. He has no rales.  Abdominal: Soft. Bowel sounds are normal. He exhibits no mass. There is no hepatosplenomegaly. There is no tenderness. There is no rebound, no guarding and no CVA tenderness.  Genitourinary: Rectum normal and prostate normal.  Musculoskeletal: Normal range of motion. He exhibits no edema or tenderness.  Lymphadenopathy:    He has no cervical adenopathy.  Neurological: He is alert and oriented to person, place, and time. He has normal sensation, normal strength, normal reflexes and intact cranial nerves. No cranial nerve deficit.  Skin: Skin is warm. No rash noted.  Psychiatric: Mood and affect normal.      Assessment & Plan  Problem List Items Addressed This Visit      Other   Abdominal pain, acute, left upper quadrant - Primary   Relevant Orders   CBC w/Diff/Platelet   Hepatic function panel   Lipase   POCT Occult Blood Stool (Completed)        Dr. Otilio Miu [facility]  02/26/2015

## 2015-02-27 LAB — CBC WITH DIFFERENTIAL/PLATELET
BASOS ABS: 0.1 10*3/uL (ref 0.0–0.2)
Basos: 1 %
EOS (ABSOLUTE): 0.4 10*3/uL (ref 0.0–0.4)
EOS: 7 %
HEMOGLOBIN: 12.5 g/dL — AB (ref 12.6–17.7)
Hematocrit: 38.1 % (ref 37.5–51.0)
IMMATURE GRANS (ABS): 0 10*3/uL (ref 0.0–0.1)
IMMATURE GRANULOCYTES: 0 %
LYMPHS: 21 %
Lymphocytes Absolute: 1.3 10*3/uL (ref 0.7–3.1)
MCH: 29.8 pg (ref 26.6–33.0)
MCHC: 32.8 g/dL (ref 31.5–35.7)
MCV: 91 fL (ref 79–97)
Monocytes Absolute: 0.5 10*3/uL (ref 0.1–0.9)
Monocytes: 9 %
NEUTROS PCT: 62 %
Neutrophils Absolute: 3.7 10*3/uL (ref 1.4–7.0)
Platelets: 167 10*3/uL (ref 150–379)
RBC: 4.19 x10E6/uL (ref 4.14–5.80)
RDW: 16.8 % — ABNORMAL HIGH (ref 12.3–15.4)
WBC: 5.9 10*3/uL (ref 3.4–10.8)

## 2015-02-27 LAB — HEPATIC FUNCTION PANEL
ALBUMIN: 4.3 g/dL (ref 3.5–4.7)
ALK PHOS: 71 IU/L (ref 39–117)
ALT: 13 IU/L (ref 0–44)
AST: 18 IU/L (ref 0–40)
BILIRUBIN TOTAL: 0.4 mg/dL (ref 0.0–1.2)
Bilirubin, Direct: 0.14 mg/dL (ref 0.00–0.40)
TOTAL PROTEIN: 6.3 g/dL (ref 6.0–8.5)

## 2015-02-27 LAB — LIPASE: Lipase: 38 U/L (ref 0–59)

## 2015-03-24 DIAGNOSIS — I5032 Chronic diastolic (congestive) heart failure: Secondary | ICD-10-CM | POA: Diagnosis not present

## 2015-03-24 DIAGNOSIS — I110 Hypertensive heart disease with heart failure: Secondary | ICD-10-CM | POA: Diagnosis not present

## 2015-03-24 DIAGNOSIS — I10 Essential (primary) hypertension: Secondary | ICD-10-CM | POA: Diagnosis not present

## 2015-03-24 DIAGNOSIS — I482 Chronic atrial fibrillation: Secondary | ICD-10-CM | POA: Diagnosis not present

## 2015-03-24 DIAGNOSIS — I350 Nonrheumatic aortic (valve) stenosis: Secondary | ICD-10-CM | POA: Diagnosis not present

## 2015-07-07 ENCOUNTER — Other Ambulatory Visit: Payer: Self-pay

## 2015-07-07 MED ORDER — FUROSEMIDE 80 MG PO TABS: 80.0000 mg | ORAL_TABLET | Freq: Two times a day (BID) | ORAL | Status: DC

## 2015-07-21 DIAGNOSIS — I482 Chronic atrial fibrillation: Secondary | ICD-10-CM | POA: Diagnosis not present

## 2015-07-21 DIAGNOSIS — R600 Localized edema: Secondary | ICD-10-CM | POA: Diagnosis not present

## 2015-07-21 DIAGNOSIS — I110 Hypertensive heart disease with heart failure: Secondary | ICD-10-CM | POA: Diagnosis not present

## 2015-07-21 DIAGNOSIS — I5032 Chronic diastolic (congestive) heart failure: Secondary | ICD-10-CM | POA: Diagnosis not present

## 2015-09-09 ENCOUNTER — Other Ambulatory Visit: Payer: Self-pay | Admitting: Family Medicine

## 2015-10-04 ENCOUNTER — Encounter: Payer: Self-pay | Admitting: Emergency Medicine

## 2015-10-04 ENCOUNTER — Emergency Department: Payer: Medicare Other

## 2015-10-04 ENCOUNTER — Inpatient Hospital Stay
Admission: EM | Admit: 2015-10-04 | Discharge: 2015-10-11 | DRG: 871 | Disposition: A | Discharge status: Skilled Nursing Facility | Payer: Medicare Other | Attending: Internal Medicine | Admitting: Internal Medicine

## 2015-10-04 DIAGNOSIS — M1990 Unspecified osteoarthritis, unspecified site: Secondary | ICD-10-CM | POA: Diagnosis present

## 2015-10-04 DIAGNOSIS — I248 Other forms of acute ischemic heart disease: Secondary | ICD-10-CM | POA: Diagnosis present

## 2015-10-04 DIAGNOSIS — I493 Ventricular premature depolarization: Secondary | ICD-10-CM | POA: Diagnosis present

## 2015-10-04 DIAGNOSIS — I4891 Unspecified atrial fibrillation: Secondary | ICD-10-CM | POA: Diagnosis present

## 2015-10-04 DIAGNOSIS — R6521 Severe sepsis with septic shock: Secondary | ICD-10-CM | POA: Diagnosis present

## 2015-10-04 DIAGNOSIS — G309 Alzheimer's disease, unspecified: Secondary | ICD-10-CM | POA: Diagnosis not present

## 2015-10-04 DIAGNOSIS — J181 Lobar pneumonia, unspecified organism: Secondary | ICD-10-CM | POA: Diagnosis not present

## 2015-10-04 DIAGNOSIS — I872 Venous insufficiency (chronic) (peripheral): Secondary | ICD-10-CM | POA: Diagnosis not present

## 2015-10-04 DIAGNOSIS — Z85828 Personal history of other malignant neoplasm of skin: Secondary | ICD-10-CM

## 2015-10-04 DIAGNOSIS — Z8673 Personal history of transient ischemic attack (TIA), and cerebral infarction without residual deficits: Secondary | ICD-10-CM

## 2015-10-04 DIAGNOSIS — E876 Hypokalemia: Secondary | ICD-10-CM | POA: Diagnosis present

## 2015-10-04 DIAGNOSIS — Z87891 Personal history of nicotine dependence: Secondary | ICD-10-CM | POA: Diagnosis not present

## 2015-10-04 DIAGNOSIS — I2510 Atherosclerotic heart disease of native coronary artery without angina pectoris: Secondary | ICD-10-CM | POA: Diagnosis present

## 2015-10-04 DIAGNOSIS — E785 Hyperlipidemia, unspecified: Secondary | ICD-10-CM | POA: Diagnosis not present

## 2015-10-04 DIAGNOSIS — R279 Unspecified lack of coordination: Secondary | ICD-10-CM | POA: Diagnosis not present

## 2015-10-04 DIAGNOSIS — R1312 Dysphagia, oropharyngeal phase: Secondary | ICD-10-CM | POA: Diagnosis not present

## 2015-10-04 DIAGNOSIS — J9622 Acute and chronic respiratory failure with hypercapnia: Secondary | ICD-10-CM | POA: Diagnosis not present

## 2015-10-04 DIAGNOSIS — G2581 Restless legs syndrome: Secondary | ICD-10-CM | POA: Diagnosis present

## 2015-10-04 DIAGNOSIS — I10 Essential (primary) hypertension: Secondary | ICD-10-CM | POA: Diagnosis present

## 2015-10-04 DIAGNOSIS — D649 Anemia, unspecified: Secondary | ICD-10-CM | POA: Diagnosis present

## 2015-10-04 DIAGNOSIS — Z794 Long term (current) use of insulin: Secondary | ICD-10-CM

## 2015-10-04 DIAGNOSIS — J90 Pleural effusion, not elsewhere classified: Secondary | ICD-10-CM | POA: Diagnosis not present

## 2015-10-04 DIAGNOSIS — A419 Sepsis, unspecified organism: Secondary | ICD-10-CM | POA: Diagnosis not present

## 2015-10-04 DIAGNOSIS — Z79899 Other long term (current) drug therapy: Secondary | ICD-10-CM

## 2015-10-04 DIAGNOSIS — G20 Parkinson's disease: Secondary | ICD-10-CM | POA: Diagnosis present

## 2015-10-04 DIAGNOSIS — N179 Acute kidney failure, unspecified: Secondary | ICD-10-CM

## 2015-10-04 DIAGNOSIS — J9621 Acute and chronic respiratory failure with hypoxia: Secondary | ICD-10-CM | POA: Diagnosis not present

## 2015-10-04 DIAGNOSIS — I959 Hypotension, unspecified: Secondary | ICD-10-CM | POA: Diagnosis not present

## 2015-10-04 DIAGNOSIS — J9801 Acute bronchospasm: Secondary | ICD-10-CM | POA: Diagnosis not present

## 2015-10-04 DIAGNOSIS — E11649 Type 2 diabetes mellitus with hypoglycemia without coma: Secondary | ICD-10-CM | POA: Diagnosis not present

## 2015-10-04 DIAGNOSIS — M6281 Muscle weakness (generalized): Secondary | ICD-10-CM | POA: Diagnosis not present

## 2015-10-04 DIAGNOSIS — D696 Thrombocytopenia, unspecified: Secondary | ICD-10-CM | POA: Diagnosis present

## 2015-10-04 DIAGNOSIS — R531 Weakness: Secondary | ICD-10-CM | POA: Diagnosis not present

## 2015-10-04 DIAGNOSIS — J441 Chronic obstructive pulmonary disease with (acute) exacerbation: Secondary | ICD-10-CM | POA: Diagnosis present

## 2015-10-04 DIAGNOSIS — T17908A Unspecified foreign body in respiratory tract, part unspecified causing other injury, initial encounter: Secondary | ICD-10-CM

## 2015-10-04 DIAGNOSIS — J690 Pneumonitis due to inhalation of food and vomit: Secondary | ICD-10-CM | POA: Diagnosis present

## 2015-10-04 DIAGNOSIS — I5032 Chronic diastolic (congestive) heart failure: Secondary | ICD-10-CM | POA: Diagnosis not present

## 2015-10-04 DIAGNOSIS — F0280 Dementia in other diseases classified elsewhere without behavioral disturbance: Secondary | ICD-10-CM | POA: Diagnosis present

## 2015-10-04 DIAGNOSIS — R0602 Shortness of breath: Secondary | ICD-10-CM | POA: Diagnosis not present

## 2015-10-04 DIAGNOSIS — J9601 Acute respiratory failure with hypoxia: Secondary | ICD-10-CM | POA: Diagnosis not present

## 2015-10-04 DIAGNOSIS — J189 Pneumonia, unspecified organism: Secondary | ICD-10-CM | POA: Diagnosis not present

## 2015-10-04 DIAGNOSIS — E119 Type 2 diabetes mellitus without complications: Secondary | ICD-10-CM

## 2015-10-04 DIAGNOSIS — Z452 Encounter for adjustment and management of vascular access device: Secondary | ICD-10-CM | POA: Diagnosis not present

## 2015-10-04 DIAGNOSIS — R262 Difficulty in walking, not elsewhere classified: Secondary | ICD-10-CM | POA: Diagnosis not present

## 2015-10-04 LAB — COMPREHENSIVE METABOLIC PANEL
ALK PHOS: 44 U/L (ref 38–126)
ALT: 16 U/L — ABNORMAL LOW (ref 17–63)
AST: 52 U/L — AB (ref 15–41)
Albumin: 3.1 g/dL — ABNORMAL LOW (ref 3.5–5.0)
Anion gap: 12 (ref 5–15)
BILIRUBIN TOTAL: 1.1 mg/dL (ref 0.3–1.2)
BUN: 66 mg/dL — AB (ref 6–20)
CALCIUM: 7.9 mg/dL — AB (ref 8.9–10.3)
CO2: 23 mmol/L (ref 22–32)
CREATININE: 2.66 mg/dL — AB (ref 0.61–1.24)
Chloride: 100 mmol/L — ABNORMAL LOW (ref 101–111)
GFR, EST AFRICAN AMERICAN: 24 mL/min — AB (ref >60)
GFR, EST NON AFRICAN AMERICAN: 21 mL/min — AB (ref >60)
Glucose, Bld: 121 mg/dL — ABNORMAL HIGH (ref 65–99)
Potassium: 4.5 mmol/L (ref 3.5–5.1)
Sodium: 135 mmol/L (ref 135–145)
Total Protein: 5.3 g/dL — ABNORMAL LOW (ref 6.5–8.1)

## 2015-10-04 LAB — CBC WITH DIFFERENTIAL/PLATELET
Basophils Absolute: 0 10*3/uL (ref 0–0.1)
Basophils Relative: 0 %
Eosinophils Absolute: 0 10*3/uL (ref 0–0.7)
Eosinophils Relative: 0 %
HCT: 36.3 % — ABNORMAL LOW (ref 40.0–52.0)
HEMOGLOBIN: 12.3 g/dL — AB (ref 13.0–18.0)
LYMPHS ABS: 0.5 10*3/uL — AB (ref 1.0–3.6)
LYMPHS PCT: 5 %
MCH: 31 pg (ref 26.0–34.0)
MCHC: 33.9 g/dL (ref 32.0–36.0)
MCV: 91.6 fL (ref 80.0–100.0)
MONOS PCT: 6 %
Monocytes Absolute: 0.6 10*3/uL (ref 0.2–1.0)
NEUTROS PCT: 89 %
Neutro Abs: 9.4 10*3/uL — ABNORMAL HIGH (ref 1.4–6.5)
Platelets: 105 10*3/uL — ABNORMAL LOW (ref 150–440)
RBC: 3.96 MIL/uL — AB (ref 4.40–5.90)
RDW: 15 % — ABNORMAL HIGH (ref 11.5–14.5)
WBC: 10.6 10*3/uL (ref 3.8–10.6)

## 2015-10-04 LAB — LACTIC ACID, PLASMA: Lactic Acid, Venous: 4.2 mmol/L (ref 0.5–2.0)

## 2015-10-04 MED ORDER — PIPERACILLIN-TAZOBACTAM 3.375 G IVPB 30 MIN
3.3750 g | Freq: Once | INTRAVENOUS | Status: AC
  Administered 2015-10-04: 3.375 g via INTRAVENOUS
  Filled 2015-10-04: qty 50

## 2015-10-04 MED ORDER — NOREPINEPHRINE 4 MG/250ML-% IV SOLN
INTRAVENOUS | Status: AC
  Administered 2015-10-04: 4 mg via INTRAVENOUS
  Filled 2015-10-04: qty 250

## 2015-10-04 MED ORDER — NOREPINEPHRINE BITARTRATE 1 MG/ML IV SOLN: 2.0000 ug/min | Freq: Once | INTRAVENOUS | Status: DC

## 2015-10-04 MED ORDER — VANCOMYCIN HCL IN DEXTROSE 1-5 GM/200ML-% IV SOLN
1000.0000 mg | INTRAVENOUS | Status: DC
  Administered 2015-10-05: 1000 mg via INTRAVENOUS
  Filled 2015-10-04 (×3): qty 200

## 2015-10-04 MED ORDER — SODIUM CHLORIDE 0.9 % IV BOLUS (SEPSIS)
1000.0000 mL | INTRAVENOUS | Status: AC
  Administered 2015-10-04 – 2015-10-05 (×2): 1000 mL via INTRAVENOUS

## 2015-10-04 MED ORDER — SODIUM CHLORIDE 0.9 % IV BOLUS (SEPSIS)
500.0000 mL | INTRAVENOUS | Status: AC
  Administered 2015-10-04: 500 mL via INTRAVENOUS

## 2015-10-04 MED ORDER — PIPERACILLIN-TAZOBACTAM 4.5 G IVPB
4.5000 g | Freq: Three times a day (TID) | INTRAVENOUS | Status: DC
Start: 2015-10-05 — End: 2015-10-05
  Filled 2015-10-04 (×2): qty 100

## 2015-10-04 MED ORDER — NOREPINEPHRINE 4 MG/250ML-% IV SOLN
2.0000 ug/min | Freq: Once | INTRAVENOUS | Status: AC
  Administered 2015-10-04: 4 mg via INTRAVENOUS
  Administered 2015-10-05: 0.003 mg/min via INTRAVENOUS
  Administered 2015-10-05: 0.004 mg/min via INTRAVENOUS

## 2015-10-04 MED ORDER — SODIUM CHLORIDE 0.9 % IV BOLUS (SEPSIS)
500.0000 mL | Freq: Once | INTRAVENOUS | Status: AC
  Administered 2015-10-04: 500 mL via INTRAVENOUS

## 2015-10-04 MED ORDER — VANCOMYCIN HCL IN DEXTROSE 1-5 GM/200ML-% IV SOLN
1000.0000 mg | Freq: Once | INTRAVENOUS | Status: AC
  Administered 2015-10-04: 1000 mg via INTRAVENOUS
  Filled 2015-10-04: qty 200

## 2015-10-04 NOTE — ED Notes (Signed)
Called code sepsis to [facility] talked to amber [phone]

## 2015-10-04 NOTE — ED Notes (Signed)
Pt found hypotensive by daughter, and sp02 low. Pt denies pain

## 2015-10-04 NOTE — ED Notes (Signed)
Central line placed by dr. Kerman Passey

## 2015-10-04 NOTE — ED Provider Notes (Signed)
[facility] Emergency Department Provider Note  Time seen: 9:34 PM  I have reviewed the triage vital signs and the nursing notes.   HISTORY  Chief Complaint Hypotension    HPI [name] Good is a 82 y.o. male with a past medical history of diabetes, hypertension, hyperlipidemia, CVA, CHF, presents to the emergency department with hypotension. According to EMS report, patient lives alone however after family was unable to reach the patient they went to the patient's house and found him sleeping. The daughter who is a nurse took the patient's blood pressure and found it to be extremely low so she called EMS to bring him to the hospital. EMS states a blood pressure 60 systolic on palpation. Patient also hypoxic, round 80% on room air.EMS was able to get the patient into the upper 80s on 6 L via nasal cannula, so they brought the patient to the emergency department on a nonrebreather mask. Patient denies any chest pain. Patient is awake, alert, oriented, he does appear quite somnolent, but will awaken and answer questions and follow commands. Upon arrival to the emergent department patient is a blood pressure 64/42. Denies any chest pain. Denies fever. Denies decreased urination.     Past Medical History  Diagnosis Date  . Diabetes mellitus without complication ([place])   . Hyperlipidemia   . Hypertension   . Stroke ([place])   . Arthritis   . Cancer ([place])   . CHF (congestive heart failure) ([place])   . Venous insufficiency   . CAD (coronary artery disease)     Patient Active Problem List   Diagnosis Date Noted  . Abdominal pain, acute, left upper quadrant 02/02/2015    Past Surgical History  Procedure Laterality Date  . Shoulder surgery Right   . Back surgery    . Hernia repair    . Colonoscopy w/ biopsies and manometry catheter placement  2014    normal  . Skin cancer excision      nose, ear, arm, and face  . Eye surgery Bilateral     cataract     Current Outpatient Rx  Name  Route  Sig  Dispense  Refill  . carvedilol (COREG) 3.125 MG tablet   Oral   Take 3.125 mg by mouth 2 (two) times daily with a meal.         . doxazosin (CARDURA) 4 MG tablet   Oral   Take 4 mg by mouth daily.         . furosemide (LASIX) 80 MG tablet      TAKE ONE TABLET BY MOUTH TWICE DAILY TAKE  2  TABLETS  TWICE  DAILY  ON  TUESDAY  AND  THURSDAY   80 tablet   5   . insulin glargine (LANTUS) 100 UNIT/ML injection   Subcutaneous   Inject 15 Units into the skin every morning. Dr Enzo Montgomery         . insulin regular (NOVOLIN R,HUMULIN R) 100 units/mL injection   Subcutaneous   Inject into the skin 3 (three) times daily before meals. Sliding scale- Dr Enzo Montgomery         . losartan (COZAAR) 50 MG tablet   Oral   Take 50 mg by mouth daily.         . metFORMIN (GLUCOPHAGE) 1000 MG tablet   Oral   Take 1,000 mg by mouth 2 (two) times daily with a meal. Dr Enzo Montgomery         . pramipexole (  MIRAPEX) 1 MG tablet   Oral   Take 1 mg by mouth 2 (two) times daily. Dr Enzo Montgomery         . rOPINIRole (REQUIP) 2 MG tablet      TAKE ONE TABLET BY MOUTH AT BEDTIME FOR RESTLESS LEG   7 tablet   3   . simvastatin (ZOCOR) 40 MG tablet   Oral   Take 40 mg by mouth daily.         Marland Kitchen spironolactone (ALDACTONE) 25 MG tablet   Oral   Take 25 mg by mouth daily. Dr Enzo Montgomery           Allergies Review of patient's allergies indicates not on file.  History reviewed. No pertinent family history.  Social History Social History  Substance Use Topics  . Smoking status: Former Research scientist (life sciences)  . Smokeless tobacco: Current User    Types: Chew  . Alcohol Use: No    Review of Systems Constitutional: Negative for fever.Positive for weakness. Cardiovascular: Negative for chest pain. Respiratory: Negative for shortness of breath. Gastrointestinal: Negative for abdominal pain Genitourinary: Negative for dysuria.No decreased urination. Neurological: Denies  headache. Denies focal weakness or numbness. 10-point ROS otherwise negative.  ____________________________________________   PHYSICAL EXAM:  VITAL SIGNS: ED Triage Vitals  Enc Vitals Group     BP 10/04/15 2113 64/42 mmHg     Pulse Rate 10/04/15 2113 57     Resp 10/04/15 2113 22     Temp 10/04/15 2129 97.8 F (36.6 C)     Temp Source 10/04/15 2129 Axillary     SpO2 10/04/15 2113 100 %     Weight 10/04/15 2113 165 lb (74.844 kg)     Height 10/04/15 2113 5\' 10"  (1.778 m)     Head Cir --      Peak Flow --      Pain Score --      Pain Loc --      Pain Edu? --      Excl. in [place]? --     Constitutional: Alert and oriented. Somnolent, but awakens appropriately to answer questions and follow commands. No obvious distress. Eyes: Normal exam, 2 mm PERRL ENT   Head: Normocephalic and atraumatic.   Mouth/Throat: Mucous membranes are moist. Somewhat pale mucous membranes. Cardiovascular: Normal rate, regular rhythm. No murmur Respiratory: Normal respiratory effort without tachypnea nor retractions. Breath sounds are clear. No obvious wheezes, rales, rhonchi. Gastrointestinal: Soft and nontender. No distention. Musculoskeletal: Nontender with normal range of motion in all extremities. No lower extremity edema or tenderness.  Neurologic:  Normal speech and language. No gross focal neurologic deficits Skin:  Skin is warm, dry and intact.  Psychiatric: Mood and affect are normal.   ____________________________________________    EKG  EKG reviewed and interpreted by myself shows atrial fibrillation at 53 bpm, somewhat widened QRS, normal axis, nonspecific ST changes without ST elevation.  ____________________________________________    RADIOLOGY  X-ray shows possible right-sided pneumonia.  ____________________________________________   INITIAL IMPRESSION / ASSESSMENT AND PLAN / ED COURSE  Pertinent labs & imaging results that were available during my care of the patient  were reviewed by me and considered in my medical decision making (see chart for details).  Patient presents to the emergency department with hypoxia and hypotension. Given the patient's profound hypotension, IV initiated code sepsis protocols. I was able to obtain an ultrasound-guided 18-gauge in the left antecubital fossa, we also obtained a 22-gauge to the right arm. Patient does appear somewhat pale.  He is somnolent but able to awaken to answer questions and follow commands. Appears to be answering questions appropriately. We will continue with IV fluids, have ordered an empiric IV antibiotics given his hypoxia. He we'll check labs, chest x-ray, and closely monitor in the emergency department.  X-ray shows possible right-sided pneumonia. Patient remains hypotensive. Patient's labs are resulted showing acute renal failure. Currently urinalysis is pending. Patient is on antibiotics, cultures have been sent. Patient's lactate is 4.1. Patient receiving IV fluids. Blood pressure most recently 74/48. Essential line has been inserted in the right internal jugular, I have ordered norepinephrine. We'll admit to the ICU.   CRITICAL CARE Performed by: Harvest Dark   Total critical care time: 60 minutes  Critical care time was exclusive of separately billable procedures and treating other patients.  Critical care was necessary to treat or prevent imminent or life-threatening deterioration.  Critical care was time spent personally by me on the following activities: development of treatment plan with patient and/or surrogate as well as nursing, discussions with consultants, evaluation of patient's response to treatment, examination of patient, obtaining history from patient or surrogate, ordering and performing treatments and interventions, ordering and review of laboratory studies, ordering and review of radiographic studies, pulse oximetry and re-evaluation of patient's condition.   CENTRAL  LINE Performed by: Harvest Dark Consent: The procedure was performed in an emergent situation. Required items: required blood products, implants, devices, and special equipment available Patient identity confirmed: arm band and provided demographic data Time out: Immediately prior to procedure a "time out" was called to verify the correct patient, procedure, equipment, support staff and site/side marked as required. Indications: vascular access Anesthesia: local infiltration Local anesthetic: lidocaine 1% with epinephrine Anesthetic total: 3 ml Patient sedated: no Preparation: skin prepped with 2% chlorhexidine Skin prep agent dried: skin prep agent completely dried prior to procedure Sterile barriers: all five maximum sterile barriers used - cap, mask, sterile gown, sterile gloves, and large sterile sheet Hand hygiene: hand hygiene performed prior to central venous catheter insertion  Location details: Right IJ  Catheter type: triple lumen Catheter size: 8 Fr Pre-procedure: landmarks identified Ultrasound guidance: yes Successful placement: yes Post-procedure: line sutured and dressing applied Assessment: blood return through all parts, free fluid flow, placement verified by x-ray and no pneumothorax on x-ray Patient tolerance: Patient tolerated the procedure well with no immediate complications.   ____________________________________________   FINAL CLINICAL IMPRESSION(S) / ED DIAGNOSES  Acute renal failure Sepsis Pneumonia   Harvest Dark, MD 10/04/15 2340

## 2015-10-05 ENCOUNTER — Inpatient Hospital Stay: Payer: Medicare Other

## 2015-10-05 ENCOUNTER — Inpatient Hospital Stay
Admit: 2015-10-05 | Discharge: 2015-10-05 | Disposition: A | Discharge status: Home / Self Care | Payer: Medicare Other | Attending: Adult Health | Admitting: Adult Health

## 2015-10-05 DIAGNOSIS — R6521 Severe sepsis with septic shock: Secondary | ICD-10-CM | POA: Diagnosis present

## 2015-10-05 DIAGNOSIS — A419 Sepsis, unspecified organism: Secondary | ICD-10-CM | POA: Diagnosis present

## 2015-10-05 DIAGNOSIS — Z8673 Personal history of transient ischemic attack (TIA), and cerebral infarction without residual deficits: Secondary | ICD-10-CM | POA: Diagnosis not present

## 2015-10-05 DIAGNOSIS — J9621 Acute and chronic respiratory failure with hypoxia: Secondary | ICD-10-CM | POA: Insufficient documentation

## 2015-10-05 DIAGNOSIS — I959 Hypotension, unspecified: Secondary | ICD-10-CM | POA: Diagnosis present

## 2015-10-05 DIAGNOSIS — G20 Parkinson's disease: Secondary | ICD-10-CM | POA: Diagnosis present

## 2015-10-05 DIAGNOSIS — D696 Thrombocytopenia, unspecified: Secondary | ICD-10-CM | POA: Diagnosis present

## 2015-10-05 DIAGNOSIS — N179 Acute kidney failure, unspecified: Secondary | ICD-10-CM | POA: Diagnosis present

## 2015-10-05 DIAGNOSIS — J9801 Acute bronchospasm: Secondary | ICD-10-CM | POA: Diagnosis not present

## 2015-10-05 DIAGNOSIS — G2581 Restless legs syndrome: Secondary | ICD-10-CM | POA: Diagnosis present

## 2015-10-05 DIAGNOSIS — Z85828 Personal history of other malignant neoplasm of skin: Secondary | ICD-10-CM | POA: Diagnosis not present

## 2015-10-05 DIAGNOSIS — E876 Hypokalemia: Secondary | ICD-10-CM | POA: Diagnosis present

## 2015-10-05 DIAGNOSIS — M1990 Unspecified osteoarthritis, unspecified site: Secondary | ICD-10-CM | POA: Diagnosis present

## 2015-10-05 DIAGNOSIS — Z87891 Personal history of nicotine dependence: Secondary | ICD-10-CM | POA: Diagnosis not present

## 2015-10-05 DIAGNOSIS — I248 Other forms of acute ischemic heart disease: Secondary | ICD-10-CM | POA: Diagnosis present

## 2015-10-05 DIAGNOSIS — J9622 Acute and chronic respiratory failure with hypercapnia: Secondary | ICD-10-CM | POA: Diagnosis not present

## 2015-10-05 DIAGNOSIS — E11649 Type 2 diabetes mellitus with hypoglycemia without coma: Secondary | ICD-10-CM | POA: Diagnosis present

## 2015-10-05 DIAGNOSIS — I493 Ventricular premature depolarization: Secondary | ICD-10-CM | POA: Diagnosis present

## 2015-10-05 DIAGNOSIS — J441 Chronic obstructive pulmonary disease with (acute) exacerbation: Secondary | ICD-10-CM | POA: Diagnosis present

## 2015-10-05 DIAGNOSIS — D649 Anemia, unspecified: Secondary | ICD-10-CM | POA: Diagnosis present

## 2015-10-05 DIAGNOSIS — Z794 Long term (current) use of insulin: Secondary | ICD-10-CM | POA: Diagnosis not present

## 2015-10-05 DIAGNOSIS — I10 Essential (primary) hypertension: Secondary | ICD-10-CM | POA: Diagnosis present

## 2015-10-05 DIAGNOSIS — Z79899 Other long term (current) drug therapy: Secondary | ICD-10-CM | POA: Diagnosis not present

## 2015-10-05 DIAGNOSIS — I872 Venous insufficiency (chronic) (peripheral): Secondary | ICD-10-CM | POA: Diagnosis present

## 2015-10-05 DIAGNOSIS — G309 Alzheimer's disease, unspecified: Secondary | ICD-10-CM | POA: Diagnosis present

## 2015-10-05 DIAGNOSIS — I4891 Unspecified atrial fibrillation: Secondary | ICD-10-CM | POA: Diagnosis present

## 2015-10-05 DIAGNOSIS — J690 Pneumonitis due to inhalation of food and vomit: Secondary | ICD-10-CM | POA: Insufficient documentation

## 2015-10-05 DIAGNOSIS — E785 Hyperlipidemia, unspecified: Secondary | ICD-10-CM | POA: Diagnosis present

## 2015-10-05 DIAGNOSIS — I2510 Atherosclerotic heart disease of native coronary artery without angina pectoris: Secondary | ICD-10-CM | POA: Diagnosis present

## 2015-10-05 DIAGNOSIS — F0280 Dementia in other diseases classified elsewhere without behavioral disturbance: Secondary | ICD-10-CM | POA: Diagnosis present

## 2015-10-05 LAB — BASIC METABOLIC PANEL
ANION GAP: 13 (ref 5–15)
BUN: 60 mg/dL — ABNORMAL HIGH (ref 6–20)
CALCIUM: 7.2 mg/dL — AB (ref 8.9–10.3)
CO2: 19 mmol/L — ABNORMAL LOW (ref 22–32)
Chloride: 106 mmol/L (ref 101–111)
Creatinine, Ser: 2.16 mg/dL — ABNORMAL HIGH (ref 0.61–1.24)
GFR, EST AFRICAN AMERICAN: 31 mL/min — AB (ref >60)
GFR, EST NON AFRICAN AMERICAN: 27 mL/min — AB (ref >60)
Glucose, Bld: 137 mg/dL — ABNORMAL HIGH (ref 65–99)
Potassium: 3.3 mmol/L — ABNORMAL LOW (ref 3.5–5.1)
Sodium: 138 mmol/L (ref 135–145)

## 2015-10-05 LAB — TROPONIN I
TROPONIN I: 0.21 ng/mL — AB (ref <0.031)
Troponin I: 0.13 ng/mL — ABNORMAL HIGH (ref <0.031)
Troponin I: 0.13 ng/mL — ABNORMAL HIGH (ref <0.031)

## 2015-10-05 LAB — CBC
HCT: 31.6 % — ABNORMAL LOW (ref 40.0–52.0)
HEMOGLOBIN: 10.7 g/dL — AB (ref 13.0–18.0)
MCH: 30.4 pg (ref 26.0–34.0)
MCHC: 33.8 g/dL (ref 32.0–36.0)
MCV: 89.9 fL (ref 80.0–100.0)
Platelets: 85 10*3/uL — ABNORMAL LOW (ref 150–440)
RBC: 3.51 MIL/uL — AB (ref 4.40–5.90)
RDW: 15.1 % — ABNORMAL HIGH (ref 11.5–14.5)
WBC: 7.2 10*3/uL (ref 3.8–10.6)

## 2015-10-05 LAB — GLUCOSE, CAPILLARY
GLUCOSE-CAPILLARY: 203 mg/dL — AB (ref 65–99)
GLUCOSE-CAPILLARY: 265 mg/dL — AB (ref 65–99)
GLUCOSE-CAPILLARY: 224 mg/dL — AB (ref 65–99)
GLUCOSE-CAPILLARY: 191 mg/dL — AB (ref 65–99)
Glucose-Capillary: 121 mg/dL — ABNORMAL HIGH (ref 65–99)
Glucose-Capillary: 237 mg/dL — ABNORMAL HIGH (ref 65–99)

## 2015-10-05 LAB — MRSA PCR SCREENING: MRSA by PCR: NEGATIVE

## 2015-10-05 LAB — PROCALCITONIN: Procalcitonin: 37.77 ng/mL

## 2015-10-05 LAB — LACTIC ACID, PLASMA: Lactic Acid, Venous: 1 mmol/L (ref 0.5–2.0)

## 2015-10-05 LAB — MAGNESIUM: MAGNESIUM: 1.5 mg/dL — AB (ref 1.7–2.4)

## 2015-10-05 LAB — PHOSPHORUS: Phosphorus: 3.6 mg/dL (ref 2.5–4.6)

## 2015-10-05 MED ORDER — MAGNESIUM SULFATE 2 GM/50ML IV SOLN
2.0000 g | Freq: Once | INTRAVENOUS | Status: AC
  Administered 2015-10-05: 2 g via INTRAVENOUS
  Filled 2015-10-05 (×2): qty 50

## 2015-10-05 MED ORDER — PIPERACILLIN-TAZOBACTAM 4.5 G IVPB
4.5000 g | Freq: Three times a day (TID) | INTRAVENOUS | Status: DC
  Administered 2015-10-05 (×2): 4.5 g via INTRAVENOUS
  Filled 2015-10-05 (×3): qty 100

## 2015-10-05 MED ORDER — CETYLPYRIDINIUM CHLORIDE 0.05 % MT LIQD
7.0000 mL | Freq: Two times a day (BID) | OROMUCOSAL | Status: DC
  Administered 2015-10-05: 7 mL via OROMUCOSAL

## 2015-10-05 MED ORDER — INSULIN GLARGINE 100 UNIT/ML ~~LOC~~ SOLN
20.0000 [IU] | Freq: Every morning | SUBCUTANEOUS | Status: DC
  Administered 2015-10-05 – 2015-10-09 (×5): 20 [IU] via SUBCUTANEOUS
  Filled 2015-10-05 (×7): qty 0.2

## 2015-10-05 MED ORDER — ASPIRIN EC 325 MG PO TBEC
325.0000 mg | DELAYED_RELEASE_TABLET | Freq: Every day | ORAL | Status: DC
  Administered 2015-10-05 – 2015-10-11 (×7): 325 mg via ORAL
  Filled 2015-10-05 (×7): qty 1

## 2015-10-05 MED ORDER — SODIUM CHLORIDE 0.9 % IV BOLUS (SEPSIS)
1000.0000 mL | Freq: Once | INTRAVENOUS | Status: AC
  Administered 2015-10-05: 1000 mL via INTRAVENOUS

## 2015-10-05 MED ORDER — DOXAZOSIN MESYLATE 4 MG PO TABS
4.0000 mg | ORAL_TABLET | Freq: Every day | ORAL | Status: DC
  Filled 2015-10-05: qty 1

## 2015-10-05 MED ORDER — SODIUM CHLORIDE 0.9 % IV SOLN: 250.0000 mL | INTRAVENOUS | Status: DC | PRN

## 2015-10-05 MED ORDER — POTASSIUM CHLORIDE 20 MEQ PO PACK
20.0000 meq | PACK | Freq: Once | ORAL | Status: AC
  Administered 2015-10-05: 20 meq via ORAL
  Filled 2015-10-05: qty 1

## 2015-10-05 MED ORDER — IPRATROPIUM-ALBUTEROL 0.5-2.5 (3) MG/3ML IN SOLN
3.0000 mL | Freq: Four times a day (QID) | RESPIRATORY_TRACT | Status: DC | PRN
  Administered 2015-10-06 – 2015-10-07 (×3): 3 mL via RESPIRATORY_TRACT
  Filled 2015-10-05 (×3): qty 3

## 2015-10-05 MED ORDER — ONDANSETRON HCL 4 MG/2ML IJ SOLN: 4.0000 mg | Freq: Four times a day (QID) | INTRAMUSCULAR | Status: DC | PRN

## 2015-10-05 MED ORDER — DEXTROSE 5 % IV SOLN: 2.0000 ug/min | INTRAVENOUS | Status: DC

## 2015-10-05 MED ORDER — DILTIAZEM HCL ER COATED BEADS 180 MG PO CP24
180.0000 mg | ORAL_CAPSULE | Freq: Every day | ORAL | Status: DC
  Administered 2015-10-05 – 2015-10-11 (×7): 180 mg via ORAL
  Filled 2015-10-05 (×7): qty 1

## 2015-10-05 MED ORDER — INSULIN ASPART 100 UNIT/ML ~~LOC~~ SOLN
0.0000 [IU] | Freq: Three times a day (TID) | SUBCUTANEOUS | Status: DC
  Administered 2015-10-05 – 2015-10-06 (×4): 3 [IU] via SUBCUTANEOUS
  Administered 2015-10-06: 11 [IU] via SUBCUTANEOUS
  Administered 2015-10-07: 5 [IU] via SUBCUTANEOUS
  Administered 2015-10-07: 3 [IU] via SUBCUTANEOUS
  Administered 2015-10-07: 2 [IU] via SUBCUTANEOUS
  Filled 2015-10-05 (×2): qty 3
  Filled 2015-10-05: qty 11
  Filled 2015-10-05: qty 5
  Filled 2015-10-05 (×2): qty 2
  Filled 2015-10-05 (×2): qty 3

## 2015-10-05 MED ORDER — DONEPEZIL HCL 5 MG PO TABS
10.0000 mg | ORAL_TABLET | Freq: Every day | ORAL | Status: DC
  Administered 2015-10-05 – 2015-10-10 (×7): 10 mg via ORAL
  Filled 2015-10-05 (×7): qty 2

## 2015-10-05 MED ORDER — HEPARIN SODIUM (PORCINE) 5000 UNIT/ML IJ SOLN
5000.0000 [IU] | Freq: Three times a day (TID) | INTRAMUSCULAR | Status: DC
  Administered 2015-10-05 – 2015-10-07 (×7): 5000 [IU] via SUBCUTANEOUS
  Filled 2015-10-05 (×7): qty 1

## 2015-10-05 MED ORDER — IPRATROPIUM-ALBUTEROL 0.5-2.5 (3) MG/3ML IN SOLN
3.0000 mL | Freq: Once | RESPIRATORY_TRACT | Status: AC
  Administered 2015-10-05: 3 mL via RESPIRATORY_TRACT
  Filled 2015-10-05: qty 3

## 2015-10-05 MED ORDER — INSULIN ASPART 100 UNIT/ML ~~LOC~~ SOLN: 2.0000 [IU] | SUBCUTANEOUS | Status: DC

## 2015-10-05 MED ORDER — DILTIAZEM HCL ER BEADS 180 MG PO CP24: 180.0000 mg | ORAL_CAPSULE | Freq: Two times a day (BID) | ORAL | Status: DC

## 2015-10-05 MED ORDER — NOREPINEPHRINE 4 MG/250ML-% IV SOLN
2.0000 ug/min | INTRAVENOUS | Status: DC
  Administered 2015-10-05: 4 ug/min via INTRAVENOUS
  Administered 2015-10-05: 7 ug/min via INTRAVENOUS
  Filled 2015-10-05 (×2): qty 250

## 2015-10-05 MED ORDER — INSULIN ASPART 100 UNIT/ML ~~LOC~~ SOLN
0.0000 [IU] | Freq: Three times a day (TID) | SUBCUTANEOUS | Status: DC
Start: 2015-10-05 — End: 2015-10-05
  Administered 2015-10-05 (×2): 5 [IU] via SUBCUTANEOUS
  Administered 2015-10-05: 8 [IU] via SUBCUTANEOUS
  Filled 2015-10-05 (×2): qty 5
  Filled 2015-10-05: qty 8

## 2015-10-05 MED ORDER — PRAMIPEXOLE DIHYDROCHLORIDE 1 MG PO TABS
1.5000 mg | ORAL_TABLET | Freq: Every day | ORAL | Status: DC
  Administered 2015-10-05 – 2015-10-09 (×5): 1.5 mg via ORAL
  Filled 2015-10-05 (×6): qty 2

## 2015-10-05 MED ORDER — ROPINIROLE HCL 1 MG PO TABS
2.0000 mg | ORAL_TABLET | Freq: Every day | ORAL | Status: DC
  Administered 2015-10-05 – 2015-10-10 (×7): 2 mg via ORAL
  Filled 2015-10-05 (×7): qty 2

## 2015-10-05 MED ORDER — ATORVASTATIN CALCIUM 10 MG PO TABS
10.0000 mg | ORAL_TABLET | Freq: Every day | ORAL | Status: DC
  Administered 2015-10-05 – 2015-10-11 (×7): 10 mg via ORAL
  Filled 2015-10-05 (×7): qty 1

## 2015-10-05 MED ORDER — PIPERACILLIN-TAZOBACTAM 4.5 G IVPB: 4.5000 g | Freq: Three times a day (TID) | INTRAVENOUS | Status: DC

## 2015-10-05 MED ORDER — SODIUM CHLORIDE 0.9 % IV SOLN
INTRAVENOUS | Status: DC
  Administered 2015-10-05 – 2015-10-08 (×7): via INTRAVENOUS

## 2015-10-05 MED ORDER — PIPERACILLIN-TAZOBACTAM 3.375 G IVPB
3.3750 g | Freq: Three times a day (TID) | INTRAVENOUS | Status: AC
  Administered 2015-10-05 – 2015-10-09 (×11): 3.375 g via INTRAVENOUS
  Filled 2015-10-05 (×16): qty 50

## 2015-10-05 NOTE — Progress Notes (Deleted)
Update No improvement on bipap, setting adjust, still with lethargy/confusion, unable to consistently maintain saturations >88% Daughter at bedside. Given worsening respiratory status, decision made to intubate. Daughter updated.  Vilinda Boehringer, MD [facility] Pager [phone] (please enter 7-digits) On Call Pager - [phone] (please enter 7-digits)

## 2015-10-05 NOTE — Progress Notes (Addendum)
Pharmacy Antibiotic Note  [name] Good is a 82 y.o. male admitted on 10/04/2015 with sepsis.  Pharmacy has been consulted for vancomycin and Zosyn dosing.  Plan: DW 74.8kg  Vd 52L kei 0.023 hr-1  t1/2 30 hours Vancomycin 1 gram q 36 hours ordered with stacked dosing. Level before 4th dose. Goal trough 15-20.  Zosyn 4.5 grams q 8 hours ordered for Pseudomonas risk of patient with sepsis on pressor.  Height: 5\' 10"  (177.8 cm) Weight: 165 lb (74.844 kg) IBW/kg (Calculated) : 73  Temp (24hrs), Avg:97.8 F (36.6 C), Min:97.8 F (36.6 C), Max:97.8 F (36.6 C)   Recent Labs Lab 10/04/15 2118 10/04/15 2210  WBC 10.6  --   CREATININE  --  2.66*  LATICACIDVEN 4.2*  --     Estimated Creatinine Clearance: 21.7 mL/min (by C-G formula based on Cr of 2.66).    Allergies  Allergen Reactions  . Biaxin [Clarithromycin] Nausea Only    Antimicrobials this admission: vancomycin Zosyn >>    >>   Dose adjustments this admission:   Microbiology results: 4/23 BCx: pending 4/23 UCx: pending   4/23 CXR: R basilar opacity 4/23 UA: pending  Thank you for allowing pharmacy to be a part of this patient's care.  [name] 10/05/2015 1:24 AM

## 2015-10-05 NOTE — Progress Notes (Signed)
*  PRELIMINARY RESULTS* Echocardiogram 2D Echocardiogram has been performed.  [name] Good 10/05/2015, 2:01 PM

## 2015-10-05 NOTE — Procedures (Deleted)
Procedure Note:  Orotracheal Intubation  Implied consent due to emergent nature of patient's condition. Correct Patient, Name & ID confirmed.  The patient was pre-oxygenated and then, under direct visualization, a 7.5 mm cuffed endotracheal tube was placed through the vocal cords into the trachea, using the MAC.  Total attempts made 2. Initial attempt made NP Bincy Varguhese using glidescope, however small VC and very anterior VC.  Final intubation done by Dr. Stevenson Clinch using MAC and size 7.24mm ETT.  During intubation an assistant applied gentle pressure to the cricoid cartilage.  Position confirmed by auscultation of lungs (good breath sounds bilaterally) and no stomach sounds.  Tube secured at 22m. Pulse ox 99%. CO2 detector in place with appropriate color change.   Pt tolerated procedure well.  No complications were noted.   CXR ordered.   Vilinda Boehringer, MD [facility] Pager [phone] On Call Pager [phone]

## 2015-10-05 NOTE — H&P (Signed)
PULMONARY / CRITICAL CARE MEDICINE   Name: [name] Good MRN: [id number] DOB: June 10, 1931    ADMISSION DATE:  10/04/2015    CHIEF COMPLAINT:  Hypoxia and hypotension  HISTORY OF PRESENT ILLNESS:   82 YO WM with a PMH of hypertension, T2DM, CVA, CHF and CAD who presents hypotensions. Patient state that he wasn't feeling well so he stayed in bed. His daughter found him to be hypotensive with systolic blood pressures in the 60s hence he was bought to the ED for evaluation.At the ED, his blood pressure was refractory to IV fluids hence a central venous catheter was placed and he was started on pressors. His oxygen saturation was low in the 80s. He was placed on 02 [place] 6L and transitioned to a non-breather mask. He is not on home O2. He is on multiple cardiac medications and reports taking all medications as prescribed. He denies any h/o hypotension. He denies CP, palpitations, dyspnea, nausea, vomiting and dizziness.  PAST MEDICAL HISTORY :  He  has a past medical history of Diabetes mellitus without complication ([place]); Hyperlipidemia; Hypertension; Stroke [facility]); Arthritis; Cancer [facility]); CHF (congestive heart failure) ([place]); Venous insufficiency; and CAD (coronary artery disease).  PAST SURGICAL HISTORY: He  has past surgical history that includes Shoulder surgery (Right); Back surgery; Hernia repair; Colonoscopy w/ biopsies and manometry catheter placement (2014); Skin cancer excision; and Eye surgery (Bilateral).  Allergies  Allergen Reactions  . Biaxin [Clarithromycin] Nausea Only    No current facility-administered medications on file prior to encounter.   Current Outpatient Prescriptions on File Prior to Encounter  Medication Sig  . doxazosin (CARDURA) 4 MG tablet Take 4 mg by mouth at bedtime.   . insulin glargine (LANTUS) 100 UNIT/ML injection Inject 20 Units into the skin every morning. Dr Enzo Montgomery  . insulin regular (NOVOLIN R,HUMULIN R) 100 units/mL injection Inject 0-12 Units  into the skin 3 (three) times daily before meals. Sliding scale- Dr Enzo Montgomery  . losartan (COZAAR) 50 MG tablet Take 50 mg by mouth daily.  . pramipexole (MIRAPEX) 1 MG tablet Take 1.5 mg by mouth daily. Dr Enzo Montgomery  . spironolactone (ALDACTONE) 25 MG tablet Take 25 mg by mouth daily.     FAMILY HISTORY:  His has no family status information on file.   SOCIAL HISTORY: He  reports that he has quit smoking. His smokeless tobacco use includes Chew. He reports that he does not drink alcohol or use illicit drugs.  REVIEW OF SYSTEMS:   Constitutional: Negative for fever and chills.  HENT: Negative for congestion and rhinorrhea.  Eyes: Negative for redness and visual disturbance.  Respiratory: Negative for shortness of breath and wheezing.  Cardiovascular: Negative for chest pain and palpitations but positive for edema.  Gastrointestinal: Negative  for nausea , vomiting and abdominal pain and loose stools Genitourinary: Negative for dysuria and urgency.  Musculoskeletal: Negative for myalgias and arthralgias but positive for myalgias.  Skin: Negative for pallor and wound.  Neurological: Negative for dizziness and headaches   SUBJECTIVE:   VITAL SIGNS: BP 70/41 mmHg  Pulse 57  Temp(Src) 97.8 F (36.6 C) (Axillary)  Resp 17  Ht 5\' 10"  (1.778 m)  Wt 165 lb (74.844 kg)  BMI 23.68 kg/m2  SpO2 99%  HEMODYNAMICS:    VENTILATOR SETTINGS:    INTAKE / OUTPUT:    PHYSICAL EXAMINATION: General:  Chronically ill looking Neuro:  AAOX3, speech is clear, moves all extremities HEENT: PERRLA, oral mucosa moist and pink Cardiovascular: RRR, S1/S2, no MRG Lungs:  normal WOB, bilateral airflow, diminished in the bases Abdomen: normal bowel sounds, no palpable organomegaly  Musculoskeletal:  +rom Extremities: +2 edema, +2 pulses Skin: No rash or lesions  LABS:  BMET  Recent Labs Lab 10/04/15 2210  NA 135  K 4.5  CL 100*  CO2 23  BUN 66*  CREATININE 2.66*  GLUCOSE 121*     Electrolytes  Recent Labs Lab 10/04/15 2210  CALCIUM 7.9*    CBC  Recent Labs Lab 10/04/15 2118  WBC 10.6  HGB 12.3*  HCT 36.3*  PLT 105*    Coag's No results for input(s): APTT, INR in the last 168 hours.  Sepsis Markers  Recent Labs Lab 10/04/15 2118  LATICACIDVEN 4.2*    ABG No results for input(s): PHART, PCO2ART, PO2ART in the last 168 hours.  Liver Enzymes  Recent Labs Lab 10/04/15 2210  AST 52*  ALT 16*  ALKPHOS 44  BILITOT 1.1  ALBUMIN 3.1*    Cardiac Enzymes No results for input(s): TROPONINI, PROBNP in the last 168 hours.  Glucose No results for input(s): GLUCAP in the last 168 hours.  Imaging Dg Chest Port 1 View  10/04/2015  CLINICAL DATA:  Acute onset of hypotension. Shortness of breath. Initial encounter. EXAM: PORTABLE CHEST 1 VIEW COMPARISON:  Chest radiograph performed 10/10/2014 FINDINGS: There is elevation of the right hemidiaphragm. Mild vascular congestion is noted. Mild right basilar opacity may reflect atelectasis or possibly mild pneumonia. No pleural effusion or pneumothorax is seen. The cardiomediastinal silhouette is mildly enlarged. No acute osseous abnormalities are identified. IMPRESSION: Elevation of the right hemidiaphragm. Mild vascular congestion and mild cardiomegaly noted. Mild right basilar airspace opacity may reflect atelectasis or possibly pneumonia. Electronically Signed   By: Garald Balding M.D.   On: 10/04/2015 21:44    STUDIES:  2 D echo-pending  CULTURES: 04/24 Urine> Blood> MRSA PCR>  ANTIBIOTICS: 04/24 Vancomycin>> Zosyn>>  SIGNIFICANT EVENTS: 04/24>Amitted with septic shock, mild elevation in troponin, RLL pneumonia and ARF.    LINES/TUBES: Right IJ 04/24>> Foley>>  DISCUSSION: 82 YO male presenting with septic shock, sepsis of respiratory source, CAP, and AKI   ASSESSMENT / PLAN:  PULMONARY A: Elevated right hemidiaphragm-exact etiology unclear Mild pulmonary edema Acute  respiratory distress Former smoker P:   -Supplemental O2 [place] to keep SPO2>90% -Nebulized bronchodilators -CXR in am  CARDIOVASCULAR A:  Septic shock Hypertension CAD CHF Mildly elevated troponin-likely 2/2 demand ischemia P:  -Hold all antihypertensive -IV fluids -Levophed gtt to maintain MAP>60 -2-D ECHO -Cycle cardiac enzymes -Hemodynamics per ICU  RENAL A:   AKI-Baseline creatinine 1.2, now 2.66 P:   -IV fluids -Trend creatinine -Avoid nephrotoxic drugs -Monitor and replace electrolytes  HEMATOLOGIC A:   Anemia Thrombocytopenia P:  -Monitor cbc and transfuse per protocol -D/C SQ Heparin if platelets continue to drop  INFECTIOUS A:   CAP Sepsis of pulmonary source P:   -Empiric antibiotics -F/U Cultures and adjust antibiotics prn  ENDOCRINE A:   Diabetes type 2 P:   -POCT Glucose monitoring with SSI coverage  NEUROLOGIC A:   Alzheimer's dementia Parkinson's disease P:   RASS goal: n/a -Continue home meds -supportive care   Disposition and family update: No family at bedside. Changes in current plan of care pending clinical course and diagnostics.  Best Practice: Code Status:  Full. Diet: Heart Healthy / Carb Mod. GI prophylaxis: Not indicated VTE prophylaxis:  SCD's / heparin.  Total CCM time is 65 minutes  Magdalene S. Tukov ANP-BC Pulmonary and Critical Care  [place] Pager [phone]    10/05/2015, 12:30 AM  Patient seen and examined with respiratory condition, and I agree with the assessment and plan as above, site where as amended.  The patient is an 82 year old male who is admitted from home with diminished responsiveness, hyperglycemia. I reviewed the patient's imaging and lab work. It appears that the patient does not have adequate evidence of pneumonia on the chest x-ray, though on physical exam today, the patient does have bilateral crackles, I suspect that the patient's infiltrates have not yet shown up  on chest x-ray but may soon do so in the near future. The patient likely had some kind of septic insult with resultant hyperglycemia and dehydration leading to hypotension. We'll continue with IV fluids, pressors will be weaned off as tolerated, will continue with broad-spectrum antibiotics. Family including the patient's son and father-in-law were updated at the bedside.  Marda Stalker, M.D.  10/05/2015  Critical Care Attestation.  I have personally obtained a history, examined the patient, evaluated laboratory and imaging results, formulated the assessment and plan and placed orders. The Patient requires high complexity decision making for assessment and support, frequent evaluation and titration of therapies, application of advanced monitoring technologies and extensive interpretation of multiple databases. The patient has critical illness that could lead imminently to failure of 1 or more organ systems and requires the highest level of physician preparedness to intervene.  Critical Care Time devoted to patient care services described in this note is 35 minutes and is exclusive of time spent in procedures.

## 2015-10-05 NOTE — Care Management Note (Signed)
Case Management Note  Patient Details  Name: [name] Good MRN: [id number] Date of Birth: 12/03/1930  Subjective/Objective:  CM met with patient and his granddaughter at bedside. Patient lives at home alone. Granddaughter lives close so she is able to assist patient with transportation and other needs. He uses a Corporate investment [employer].  PCP is Dr. Ronnald Ramp. Seen in the last month. Denies issues accessing medical care, transporation or copays. Patient is not interested in STR if recommended but is willing to accept home health with no agency preference.  [facility] but [facility] refuses [facility].  TC to [place] at the [place] with update. Patient is a "nonservicing patient"   No further action required by CM. Faxed H&P to [place].  Currently on levophed for hypotension. O2 @ 2L.             Action/Plan: Follow progression.   Expected Discharge Date:                  Expected Discharge Plan:     In-House Referral:     Discharge planning Services  CM Consult  Post Acute Care Choice:    Choice offered to:     DME Arranged:    DME Agency:     HH Arranged:    HH Agency:     Status of Service:  In process, will continue to follow  Medicare Important Message Given:    Date Medicare IM Given:    Medicare IM give by:    Date Additional Medicare IM Given:    Additional Medicare Important Message give by:     If discussed at [place] of Stay Meetings, dates discussed:    Additional Comments:  Jolly Mango, RN 10/05/2015, 2:20 PM

## 2015-10-05 NOTE — Progress Notes (Signed)
[facility] NOTE  Pharmacy Consult for electrolyte monitoring and replacement   Allergies  Allergen Reactions  . Biaxin [Clarithromycin] Nausea Only   Patient Measurements: Height: 5\' 10"  (177.8 cm) Weight: 169 lb 15.6 oz (77.1 kg) IBW/kg (Calculated) : 73  Intake/Output from previous day: 04/23 0701 - 04/24 0700 In: 575.6 [I.V.:475.6; IV Piggyback:100] Out: 516 [Urine:516] Intake/Output from this shift: Total I/O In: 657.5 [I.V.:457.5; IV Piggyback:200] Out: 300 [Urine:300]  Labs:  Recent Labs  10/04/15 2118 10/04/15 2210 10/05/15 0312  WBC 10.6  --  7.2  HGB 12.3*  --  10.7*  HCT 36.3*  --  31.6*  PLT 105*  --  85*  CREATININE  --  2.66* 2.16*  MG  --   --  1.5*  PHOS  --   --  3.6  ALBUMIN  --  3.1*  --   PROT  --  5.3*  --   AST  --  52*  --   ALT  --  16*  --   ALKPHOS  --  44  --   BILITOT  --  1.1  --    Estimated Creatinine Clearance: 26.8 mL/min (by C-G formula based on Cr of 2.16).   Recent Labs  10/05/15 0251 10/05/15 0726 10/05/15 1155  GLUCAP 121* 203* 265*   Assessment: Pharmacy consulted to monitor and replace electrolytes in this 82 year old male.   Mg 1.5 (low) K 3.3 (low) Phos 3.6  Goal of Therapy:  Electrolytes within normal limits  Plan:  Order magnesium 2 g IV x1 dose Order KCl 20 mEq PO packet x1 dose  Will recheck K and Mg with AM labs tomorrow. Thank you for the consult.  Lenis Noon, PharmD Clinical Pharmacist 10/05/2015,12:10 PM

## 2015-10-05 NOTE — Progress Notes (Signed)
Chaplain rounded the unit and provided a compassionate presence and spiritual support to the patient and family.  Chaplain Tujuana Kilmartin [phone] 

## 2015-10-06 ENCOUNTER — Inpatient Hospital Stay: Payer: Medicare Other

## 2015-10-06 LAB — BASIC METABOLIC PANEL
Anion gap: 7 (ref 5–15)
BUN: 53 mg/dL — ABNORMAL HIGH (ref 6–20)
CO2: 23 mmol/L (ref 22–32)
Calcium: 8.3 mg/dL — ABNORMAL LOW (ref 8.9–10.3)
Chloride: 108 mmol/L (ref 101–111)
Creatinine, Ser: 1.88 mg/dL — ABNORMAL HIGH (ref 0.61–1.24)
GFR calc Af Amer: 36 mL/min — ABNORMAL LOW (ref >60)
GFR calc non Af Amer: 31 mL/min — ABNORMAL LOW (ref >60)
Glucose, Bld: 108 mg/dL — ABNORMAL HIGH (ref 65–99)
Potassium: 3.5 mmol/L (ref 3.5–5.1)
Sodium: 138 mmol/L (ref 135–145)

## 2015-10-06 LAB — CBC
HCT: 32.6 % — ABNORMAL LOW (ref 40.0–52.0)
Hemoglobin: 11.1 g/dL — ABNORMAL LOW (ref 13.0–18.0)
MCH: 30.7 pg (ref 26.0–34.0)
MCHC: 34.1 g/dL (ref 32.0–36.0)
MCV: 90.1 fL (ref 80.0–100.0)
Platelets: 95 10*3/uL — ABNORMAL LOW (ref 150–440)
RBC: 3.62 MIL/uL — ABNORMAL LOW (ref 4.40–5.90)
RDW: 15.1 % — ABNORMAL HIGH (ref 11.5–14.5)
WBC: 6.9 10*3/uL (ref 3.8–10.6)

## 2015-10-06 LAB — GLUCOSE, CAPILLARY
GLUCOSE-CAPILLARY: 162 mg/dL — AB (ref 65–99)
GLUCOSE-CAPILLARY: 307 mg/dL — AB (ref 65–99)
GLUCOSE-CAPILLARY: 169 mg/dL — AB (ref 65–99)
GLUCOSE-CAPILLARY: 162 mg/dL — AB (ref 65–99)
Glucose-Capillary: 77 mg/dL (ref 65–99)

## 2015-10-06 LAB — ECHOCARDIOGRAM COMPLETE
HEIGHTINCHES: 70 in
Weight: 2719.59 oz

## 2015-10-06 LAB — LACTIC ACID, PLASMA: Lactic Acid, Venous: 0.9 mmol/L (ref 0.5–2.0)

## 2015-10-06 LAB — MAGNESIUM: Magnesium: 2.2 mg/dL (ref 1.7–2.4)

## 2015-10-06 NOTE — Progress Notes (Signed)
eLink Physician-Brief Progress Note Patient Name: [name] Good DOB: [birth year] MRN: [id number]   Date of Service  10/06/2015  HPI/Events of Note  Multiple issues: 1. Do we want to continue IV fluids at 100 mL/hour and 2. HR has dipped down into the 30's and is now in the 50's.  eICU Interventions  Will order: 1. Monitor CVP. 2. Hold AM dose of Diltiazem CD if HR < 60.     Intervention Category Major Interventions: Arrhythmia - evaluation and management;Other:  Sommer,Steven Cornelia Copa 10/06/2015, 2:20 AM

## 2015-10-06 NOTE — Progress Notes (Signed)
Patient ID: [name] Good, male   DOB: 07/15/1930, 82 y.o.   MRN: [id number] ICU attending signed out case to [place] patient already on the floor. Since they saw the patient today. We will take over care tomorrow. Dr. Loletha Grayer

## 2015-10-06 NOTE — Care Management (Signed)
Orders present for transfer out of icu

## 2015-10-06 NOTE — Progress Notes (Signed)
[facility] Progess Note    ASSESSMENT/PLAN   82 year old male presenting with right mid zone pneumonia and sepsis with initial hypotension requiring pressors, now weaned off.  ASSESSMENT / PLAN:  PULMONARY A: Elevated right hemidiaphragm-exact etiology unclear Mild pulmonary edema, doing better, now on room air. Acute respiratory distress Former smoker P:  -Supplemental O2 [place] to keep SPO2>90% -Nebulized bronchodilators -CXR in am  CARDIOVASCULAR A:  Septic shock Hypertension CAD CHF Mildly elevated troponin-likely 2/2 demand ischemia P:  -Hold all antihypertensive -IV fluids -Levophed weaned off. -2-D ECHO -Cycle cardiac enzymes -Hemodynamics per ICU  RENAL A:  AKI-Baseline creatinine 1.2, now 2.66 P:  -IV fluids -Trend creatinine -Avoid nephrotoxic drugs -Monitor and replace electrolytes  HEMATOLOGIC A:  Anemia Thrombocytopenia P:  -Monitor cbc and transfuse per protocol -D/C SQ Heparin if platelets continue to drop  INFECTIOUS A:  CAP Sepsis of pulmonary source P:  -Empiric antibiotics   ENDOCRINE A:  Diabetes type 2 P:  -POCT Glucose monitoring with SSI coverage  NEUROLOGIC A:  Alzheimer's dementia Parkinson's disease P:  RASS goal: n/a -Continue home meds -supportive care     ---------------------------------------   ----------------------------------------   Name: [name] Good MRN: [id number] DOB: 05-Mar-1931    ADMISSION DATE:  10/04/2015   SUBJECTIVE:   Patient feeling much better today, in good spirits.  Review of Systems:  Constitutional: Feels well. Cardiovascular: No chest pain.  Pulmonary: Denies dyspnea.   The remainder of systems were reviewed and were found to be negative other than what is documented in the HPI.    VITAL SIGNS: Temp:  [97.6 F (36.4 C)-98.2 F (36.8 C)] 97.6 F (36.4 C) (04/25 0800) Pulse Rate:  [29-99] 67 (04/25 0800) Resp:   [16-24] 18 (04/25 1400) BP: (90-143)/(53-115) 121/67 mmHg (04/25 1400) SpO2:  [92 %-100 %] 95 % (04/25 0800) Weight:  [174 lb 13.2 oz (79.3 kg)] 174 lb 13.2 oz (79.3 kg) (04/25 0500) HEMODYNAMICS: CVP:  [8 mmHg-20 mmHg] 9 mmHg VENTILATOR SETTINGS:   INTAKE / OUTPUT:  Intake/Output Summary (Last 24 hours) at 10/06/15 1445 Last data filed at 10/06/15 1002  Gross per 24 hour  Intake 2374.77 ml  Output    850 ml  Net 1524.77 ml    PHYSICAL EXAMINATION: Physical Examination:   VS: BP 121/67 mmHg  Pulse 67  Temp(Src) 97.6 F (36.4 C) (Axillary)  Resp 18  Ht 5\' 10"  (1.778 m)  Wt 174 lb 13.2 oz (79.3 kg)  BMI 25.08 kg/m2  SpO2 95%  General Appearance: No distress  Neuro:without focal findings, mental status normal. HEENT: PERRLA, EOM intact. Pulmonary: normal breath sounds   CardiovascularNormal S1,S2.  No m/r/g.   Abdomen: Benign, Soft, non-tender. Renal:  No costovertebral tenderness  GU:  Not performed at this time. Endocrine: No evident thyromegaly. Skin:   warm, no rashes, no ecchymosis  Extremities: normal, no cyanosis, clubbing.   LABS:   LABORATORY PANEL:   CBC  Recent Labs Lab 10/06/15 0511  WBC 6.9  HGB 11.1*  HCT 32.6*  PLT 95*    Chemistries   Recent Labs Lab 10/04/15 2210  10/05/15 0312 10/06/15 0511  NA 135  --  138 138  K 4.5  --  3.3* 3.5  CL 100*  --  106 108  CO2 23  --  19* 23  GLUCOSE 121*  --  137* 108*  BUN 66*  --  60* 53*  CREATININE 2.66*  --  2.16* 1.88*  CALCIUM 7.9*  --  7.2* 8.3*  MG  --   < > 1.5* 2.2  PHOS  --   --  3.6  --   AST 52*  --   --   --   ALT 16*  --   --   --   ALKPHOS 44  --   --   --   BILITOT 1.1  --   --   --   < > = values in this interval not displayed.   Recent Labs Lab 10/05/15 1630 10/05/15 1958 10/05/15 2144 10/06/15 0256 10/06/15 0737 10/06/15 1143  GLUCAP 237* 224* 191* 77 162* 307*   No results for input(s): PHART, PCO2ART, PO2ART in the last 168 hours.  Recent Labs Lab  10/04/15 2210  AST 52*  ALT 16*  ALKPHOS 44  BILITOT 1.1  ALBUMIN 3.1*    Cardiac Enzymes  Recent Labs Lab 10/05/15 1014  TROPONINI 0.13*    RADIOLOGY:  Dg Chest Port 1 View  10/06/2015  CLINICAL DATA:  Community-acquired pneumonia; aspiration pneumonia, history of CHF, previous CVA. EXAM: PORTABLE CHEST 1 VIEW COMPARISON:  Portable chest x-ray of October 05, 2015 FINDINGS: The lungs are adequately inflated. The interstitial markings remain mildly prominent especially at the right lung base but have improved overall. The cardiac silhouette is enlarged. The pulmonary vascularity is not clearly engorged. The right internal jugular venous catheter tip projects over the proximal SVC. The bony thorax exhibits no acute abnormality. There are multiple old healed left rib fractures. IMPRESSION: Improved pulmonary interstitial infiltrates or edema. Stable mild cardiomegaly with decreased pulmonary vascular congestion. Electronically Signed   By: David  Martinique M.D.   On: 10/06/2015 07:29   Dg Chest Portable 1 View  10/05/2015  CLINICAL DATA:  Central line placement EXAM: PORTABLE CHEST 1 VIEW COMPARISON:  Yesterday FINDINGS: New right IJ central line with tip at the upper SVC. No pneumothorax or new mediastinal widening. Unchanged cardiomegaly. Right basilar opacity is again concerning for pneumonia, especially given history of septic shock. No edema or effusion. IMPRESSION: New central line with tip in good position. Negative for pneumothorax. Electronically Signed   By: Monte Fantasia M.D.   On: 10/05/2015 02:05   Dg Chest Port 1 View  10/04/2015  CLINICAL DATA:  Acute onset of hypotension. Shortness of breath. Initial encounter. EXAM: PORTABLE CHEST 1 VIEW COMPARISON:  Chest radiograph performed 10/10/2014 FINDINGS: There is elevation of the right hemidiaphragm. Mild vascular congestion is noted. Mild right basilar opacity may reflect atelectasis or possibly mild pneumonia. No pleural effusion or  pneumothorax is seen. The cardiomediastinal silhouette is mildly enlarged. No acute osseous abnormalities are identified. IMPRESSION: Elevation of the right hemidiaphragm. Mild vascular congestion and mild cardiomegaly noted. Mild right basilar airspace opacity may reflect atelectasis or possibly pneumonia. Electronically Signed   By: Garald Balding M.D.   On: 10/04/2015 21:44       --Marda Stalker, MD.  ICU Pager: [phone] [facility] Office Number: [phone]  Patricia Pesa, M.D.  Vilinda Boehringer, M.D.  Merton Border, M.D  10/06/2015

## 2015-10-06 NOTE — Progress Notes (Signed)
[facility] NOTE  Pharmacy Consult for electrolyte monitoring and replacement   Allergies  Allergen Reactions  . Biaxin [Clarithromycin] Nausea Only   Patient Measurements: Height: 5\' 10"  (177.8 cm) Weight: 174 lb 13.2 oz (79.3 kg) IBW/kg (Calculated) : 73  Intake/Output from previous day: 04/24 0701 - 04/25 0700 In: 3921.8 [P.O.:600; I.V.:2921.8; IV Piggyback:400] Out: 1250 [Urine:1250] Intake/Output from this shift:    Labs:  Recent Labs  10/04/15 2118 10/04/15 2210 10/05/15 0312 10/06/15 0511  WBC 10.6  --  7.2 6.9  HGB 12.3*  --  10.7* 11.1*  HCT 36.3*  --  31.6* 32.6*  PLT 105*  --  85* 95*  CREATININE  --  2.66* 2.16* 1.88*  MG  --   --  1.5* 2.2  PHOS  --   --  3.6  --   ALBUMIN  --  3.1*  --   --   PROT  --  5.3*  --   --   AST  --  52*  --   --   ALT  --  16*  --   --   ALKPHOS  --  44  --   --   BILITOT  --  1.1  --   --    Estimated Creatinine Clearance: 30.7 mL/min (by C-G formula based on Cr of 1.88).   Recent Labs  10/06/15 0256 10/06/15 0737 10/06/15 1143  GLUCAP 77 162* 307*   Assessment: Pharmacy consulted to monitor and replace electrolytes in this 82 year old male.   Mg 2.2, K 3.5 - both within normal limits  Goal of Therapy:  Electrolytes within normal limits  Plan:  No supplementation needed at this time.   Will recheck with AM labs tomorrow. Thank you for allowing pharmacy to be part of this patient's care.  Lenis Noon, PharmD Clinical Pharmacist 10/06/2015,12:15 PM

## 2015-10-06 NOTE — Progress Notes (Signed)
Patient transfer to rm 254 per MD order. Patient transported in bed with telemetry monitoring. Report called to Katha Cabal, Therapist, sports. NS 100 ml/hr with Pipercillin secondary infusing.

## 2015-10-06 NOTE — Progress Notes (Signed)
Pharmacy Antibiotic Note  [name] Good is a 82 y.o. male admitted on 10/04/2015 with pneumonia.  Pharmacy has been consulted for piperacillin/tazobactam dosing.  This is day #3 of antibiotics. Patient has been on vancomycin and piperacillin/tazobactam. Admitted with sepsis with suspected pulmonary source. Vancomycin was discontinued today as MRSA PCR is negative.   Plan: Continue piperacillin/tazobactam 3.375 g IV q8h EI  Height: 5\' 10"  (177.8 cm) Weight: 174 lb 13.2 oz (79.3 kg) IBW/kg (Calculated) : 73  Temp (24hrs), Avg:97.9 F (36.6 C), Min:97.6 F (36.4 C), Max:98.2 F (36.8 C)   Recent Labs Lab 10/04/15 2118 10/04/15 2210 10/05/15 0312 10/06/15 0511  WBC 10.6  --  7.2 6.9  CREATININE  --  2.66* 2.16* 1.88*  LATICACIDVEN 4.2*  --  1.0 0.9    Estimated Creatinine Clearance: 30.7 mL/min (by C-G formula based on Cr of 1.88).    Allergies  Allergen Reactions  . Biaxin [Clarithromycin] Nausea Only   Antimicrobials this admission: vancomycin 4/23 >> 4/25 Piperacillin/tazobactam 4/23 >>   Dose adjustments this admission: 4/24 piperacillin/tazobactam changed from 4.5 g to 3.375 g dose  Microbiology results: 4/23 BCx: Sent 4/23 UCx: Sent   4/23 MRSA PCR: negative  Thank you for allowing pharmacy to be a part of this patient's care.  Lenis Noon, PharmD Clinical Pharmacist 10/06/2015 2:14 PM

## 2015-10-06 NOTE — Progress Notes (Signed)
Pt. VSS, weaned off levo gtt overnight.  HR dropped to a low of 39 sporadically and unsustained, pt. Also had 5 beats of V-tach- asymptomatic for both these changes. Alerted Dr. Oletta Darter @ [place] who adjusted pt.'s parameters for morning dilt. Med. Pt. Still has NS @ 100, UOP > 500 mL O/N and 2 BM's.  Pt. Needs 1-2 contact guard assist getting OOB to the [facility] r/t rigidity and decreased function on RUE.

## 2015-10-06 NOTE — Progress Notes (Signed)
Inpatient Diabetes Program Recommendations  AACE/ADA: New Consensus Statement on Inpatient Glycemic Control (2015)  Target Ranges:  Prepandial:   less than 140 mg/dL      Peak postprandial:   less than 180 mg/dL (1-2 hours)      Critically ill patients:  140 - 180 mg/dL  Results for ZAEDYN, KOCOUREK (MRN [id number]) as of 10/06/2015 11:21  Ref. Range 10/05/2015 07:26 10/05/2015 11:55 10/05/2015 16:30 10/05/2015 19:58 10/05/2015 21:44 10/06/2015 02:56 10/06/2015 07:37  Glucose-Capillary Latest Ref Range: 65-99 mg/dL 203 (H) 265 (H) 237 (H) 224 (H) 191 (H) 77 162 (H)   Review of Glycemic Control  Diabetes history: DM2 Outpatient Diabetes medications: Lantus 20 units QAM, Novolin R 0-12 units TID with meals, Metformin 500 mg BID Current orders for Inpatient glycemic control: Lantus 20 units QAM, Novolog 0-15 units ACHS & 2am  Inpatient Diabetes Program Recommendations: Insulin - Meal Coverage: If patient is eating at least 50% of meals, please consider ordering Novolog 5 units TID with meals for meal coverage.  Thanks, Barnie Alderman, RN, MSN, CDE Diabetes Coordinator Inpatient Diabetes Program [phone] (Team Pager from [place] to [place]) [phone] ([facility] office) [phone] [facility] office) [phone] [facility] office)

## 2015-10-06 NOTE — Progress Notes (Signed)
Patient arrived to 2A room 254. Vitals are stable. Afib on tele, box [id number]. No complaints at this time. Will continue to monitor.

## 2015-10-06 NOTE — Progress Notes (Signed)
Discussed patient status in rounds, per Dr. Ashby Dawes, may transfer patient out of CCU, transfer to any med surg with telemetry.

## 2015-10-07 LAB — MAGNESIUM: Magnesium: 2.3 mg/dL (ref 1.7–2.4)

## 2015-10-07 LAB — URINALYSIS COMPLETE WITH MICROSCOPIC (ARMC ONLY)
Bacteria, UA: NONE SEEN
Bilirubin Urine: NEGATIVE
Glucose, UA: NEGATIVE mg/dL
KETONES UR: NEGATIVE mg/dL
LEUKOCYTES UA: NEGATIVE
Nitrite: NEGATIVE
PROTEIN: NEGATIVE mg/dL
SPECIFIC GRAVITY, URINE: 1.015 (ref 1.005–1.030)
pH: 6 (ref 5.0–8.0)

## 2015-10-07 LAB — GLUCOSE, CAPILLARY
GLUCOSE-CAPILLARY: 110 mg/dL — AB (ref 65–99)
GLUCOSE-CAPILLARY: 206 mg/dL — AB (ref 65–99)
GLUCOSE-CAPILLARY: 126 mg/dL — AB (ref 65–99)
Glucose-Capillary: 102 mg/dL — ABNORMAL HIGH (ref 65–99)
Glucose-Capillary: 179 mg/dL — ABNORMAL HIGH (ref 65–99)

## 2015-10-07 LAB — BASIC METABOLIC PANEL
ANION GAP: 4 — AB (ref 5–15)
BUN: 30 mg/dL — ABNORMAL HIGH (ref 6–20)
CALCIUM: 8.4 mg/dL — AB (ref 8.9–10.3)
CO2: 24 mmol/L (ref 22–32)
Chloride: 113 mmol/L — ABNORMAL HIGH (ref 101–111)
Creatinine, Ser: 1.24 mg/dL (ref 0.61–1.24)
GFR, EST NON AFRICAN AMERICAN: 52 mL/min — AB (ref >60)
GLUCOSE: 100 mg/dL — AB (ref 65–99)
Potassium: 3.1 mmol/L — ABNORMAL LOW (ref 3.5–5.1)
Sodium: 141 mmol/L (ref 135–145)

## 2015-10-07 LAB — POTASSIUM: Potassium: 4.2 mmol/L (ref 3.5–5.1)

## 2015-10-07 MED ORDER — ENOXAPARIN SODIUM 40 MG/0.4ML ~~LOC~~ SOLN
40.0000 mg | SUBCUTANEOUS | Status: DC
  Administered 2015-10-07 – 2015-10-10 (×4): 40 mg via SUBCUTANEOUS
  Filled 2015-10-07 (×4): qty 0.4

## 2015-10-07 MED ORDER — BENZONATATE 100 MG PO CAPS
100.0000 mg | ORAL_CAPSULE | Freq: Two times a day (BID) | ORAL | Status: DC | PRN
  Administered 2015-10-07 – 2015-10-08 (×3): 100 mg via ORAL
  Filled 2015-10-07 (×3): qty 1

## 2015-10-07 MED ORDER — POTASSIUM CHLORIDE 10 MEQ/100ML IV SOLN
10.0000 meq | INTRAVENOUS | Status: AC
  Administered 2015-10-07 (×4): 10 meq via INTRAVENOUS
  Filled 2015-10-07 (×4): qty 100

## 2015-10-07 MED ORDER — GLUCERNA SHAKE PO LIQD
237.0000 mL | Freq: Two times a day (BID) | ORAL | Status: DC
  Administered 2015-10-08 – 2015-10-11 (×7): 237 mL via ORAL

## 2015-10-07 MED ORDER — BUDESONIDE 0.25 MG/2ML IN SUSP
0.2500 mg | Freq: Two times a day (BID) | RESPIRATORY_TRACT | Status: DC
  Administered 2015-10-07 – 2015-10-11 (×8): 0.25 mg via RESPIRATORY_TRACT
  Filled 2015-10-07 (×8): qty 2

## 2015-10-07 MED ORDER — IPRATROPIUM-ALBUTEROL 0.5-2.5 (3) MG/3ML IN SOLN
3.0000 mL | Freq: Four times a day (QID) | RESPIRATORY_TRACT | Status: DC
  Administered 2015-10-07 – 2015-10-11 (×15): 3 mL via RESPIRATORY_TRACT
  Filled 2015-10-07 (×17): qty 3

## 2015-10-07 MED ORDER — HALOPERIDOL LACTATE 5 MG/ML IJ SOLN: 2.5000 mg | Freq: Four times a day (QID) | INTRAMUSCULAR | Status: DC | PRN

## 2015-10-07 MED ORDER — CYCLOBENZAPRINE HCL 10 MG PO TABS
5.0000 mg | ORAL_TABLET | Freq: Three times a day (TID) | ORAL | Status: DC | PRN
Start: 2015-10-07 — End: 2015-10-11
  Administered 2015-10-07 – 2015-10-10 (×5): 5 mg via ORAL
  Filled 2015-10-07 (×5): qty 1

## 2015-10-07 NOTE — Care Management (Signed)
Prior to this episode of illness, patient independent in all adls.  Current with his PCP- [name] Good.  Physical therapy has recommended may benefit from short term snf but patient wishes to return home with home health.  Per his granddaughter [name] Good [phone] says that  patient will have round the clock caregiver support.  Agency preference is [facility].  Heads up referral called for SN PT OT Aide

## 2015-10-07 NOTE — Evaluation (Signed)
Physical Therapy Evaluation Patient Details Name: [name] Good MRN: [id number] DOB: 1930/09/30 Today's Date: 10/07/2015   History of Present Illness  82 yo M presented to ED on 4/23 after graddaughter found him to be hypotensive. He was found to have septic shock. PMH includes DM, stroke, cancer, CHF, CAD.  Clinical Impression  Pt demonstrated generalized weakness and difficulty walking with decreased activity tolerance after extended hospitalization. LE strength grossly 4/5. Pt has low activity tolerance limited by cardiopulmonary complexities. Increased time required to complete tasks due to required therapeutic rest breaks for energy conservation and to minimize excessive elevation of HR. Pt required min A for bed mobility. Transfers and ambulation up to 80 ft with FWW required min A. Cues for postural correction and pursed lip breathing. Pt demonstrated fair balance. During ambulation demonstrated slow speed with slight unsteadiness but no LOB. STR is recommended after hospital discharge to address deficits of strength, endurance, balance and gait prior to returning home alone. Pt will benefit from skilled PT services to increase functional I and mobility for safe discharge.     Follow Up Recommendations SNF    Equipment Recommendations  None recommended by PT (states he has recommended FWW)    Recommendations for Other Services       Precautions / Restrictions Precautions Precautions: Fall Restrictions Weight Bearing Restrictions: No      Mobility  Bed Mobility Overal bed mobility: Needs Assistance Bed Mobility: Supine to Sit     Supine to sit: Min assist;HOB elevated     General bed mobility comments: uses rail  Transfers Overall transfer level: Needs assistance Equipment used: Rolling walker (2 wheeled) Transfers: Sit to/from [facility] Sit to Stand: Min assist Stand pivot transfers: Min assist       General transfer comment: cues for hand  placement and anterior weight shift  Ambulation/Gait Ambulation/Gait assistance: Min assist Ambulation Distance (Feet): 80 Feet Assistive device: Rolling walker (2 wheeled) Gait Pattern/deviations: Decreased stride length;Shuffle;Trunk flexed;Narrow base of support Gait velocity: reduced Gait velocity interpretation: Below normal speed for age/gender General Gait Details: Slow speed with some unsteadiness but no LOB.   Stairs            Wheelchair Mobility    Modified Rankin (Stroke Patients Only)       Balance Overall balance assessment: Needs assistance Sitting-balance support: No upper extremity supported Sitting balance-Leahy Scale: Fair Sitting balance - Comments: poor posture   Standing balance support: Bilateral upper extremity supported Standing balance-Leahy Scale: Fair Standing balance comment: poor posture                             Pertinent Vitals/Pain Pain Assessment: No/denies pain    Home Living Family/patient expects to be discharged to:: Private residence Living Arrangements: Alone Available Help at Discharge: Family;Available PRN/intermittently Type of Home: House Home Access: Ramped entrance     Home Layout: One level Home Equipment: Walker - 2 wheels;Walker - 4 wheels;Cane - single point      Prior Function Level of Independence: Independent         Comments: Pt was I with ambulation and I with most ADLs. Family provided transportation and assitance as needed.     Hand Dominance        Extremity/Trunk Assessment   Upper Extremity Assessment: Generalized weakness;RUE deficits/detail RUE Deficits / Details: Decreased shouler ROM due to spurs per patient         Lower Extremity  Assessment: Generalized weakness (grossly 4/5)         Communication   Communication: No difficulties  Cognition Arousal/Alertness: Awake/alert Behavior During Therapy: WFL for tasks assessed/performed Overall Cognitive Status:  Within Functional Limits for tasks assessed                      General Comments General comments (skin integrity, edema, etc.): wheezing, congestion    Exercises Other Exercises Other Exercises: B LE seated therex: ankle pumps, LAQs, marching, hip add squeezes, hip abd with manual resistance, heel slides with manual resistance; standing hip flexion 2x10 each. Cues for proper technique. Therapeutic rest breaks for energy conservation and to minimize elevation of HR. Other Exercises: Discussed POC and discharge plan with pt and granddaughter. PT recommending STR to increase functional I prior to returning home alone and increase activity tolerance. Pt/family may decide against, but PT recommended he will need 24 hour assistance temporarily. Educated them that higher intensity therapy at [facility] would be very beneficial for his return to PLOF.      Assessment/Plan    PT Assessment Patient needs continued PT services  PT Diagnosis Difficulty walking;Generalized weakness   PT Problem List Decreased strength;Decreased activity tolerance;Decreased balance;Decreased mobility;Decreased knowledge of use of DME;Decreased safety awareness;Cardiopulmonary status limiting activity  PT Treatment Interventions DME instruction;Gait training;Therapeutic activities;Therapeutic exercise;Balance training;Neuromuscular re-education;Patient/family education   PT Goals (Current goals can be found in the Care Plan section) Acute Rehab PT Goals Patient Stated Goal: to get better PT Goal Formulation: With patient/family Time For Goal Achievement: 10/21/15 Potential to Achieve Goals: Fair    Frequency Min 2X/week   Barriers to discharge Decreased caregiver support pt lives alone    Co-evaluation               End of Session Equipment Utilized During Treatment: Gait belt Activity Tolerance: Patient limited by fatigue Patient left: in chair;with call bell/phone within reach;with chair alarm  set;with family/visitor present Nurse Communication: Mobility status         Time: 1342-1420 PT Time Calculation (min) (ACUTE ONLY): 38 min   Charges:   PT Evaluation $PT Eval Moderate Complexity: 1 Procedure PT Treatments $Therapeutic Exercise: 23-37 mins   PT G Codes:        Neoma Laming, PT, DPT  10/07/2015, 2:48 PM [phone]

## 2015-10-07 NOTE — Progress Notes (Signed)
[place] NOTE  Pharmacy Consult for electrolyte monitoring and replacement   Allergies  Allergen Reactions  . Biaxin [Clarithromycin] Nausea Only   Patient Measurements: Height: 5\' 10"  (177.8 cm) Weight: 175 lb (79.379 kg) IBW/kg (Calculated) : 73  Intake/Output from previous day: 04/25 0701 - 04/26 0700 In: 1721.7 [P.O.:440; I.V.:1181.7; IV Piggyback:100] Out: 650 [Urine:650] Intake/Output from this shift: Total I/O In: -  Out: 750 [Urine:750]  Labs:  Recent Labs  10/04/15 2118  10/04/15 2210 10/05/15 0312 10/06/15 0511 10/07/15 0430  WBC 10.6  --   --  7.2 6.9  --   HGB 12.3*  --   --  10.7* 11.1*  --   HCT 36.3*  --   --  31.6* 32.6*  --   PLT 105*  --   --  85* 95*  --   CREATININE  --   < > 2.66* 2.16* 1.88* 1.24  MG  --   --   --  1.5* 2.2 2.3  PHOS  --   --   --  3.6  --   --   ALBUMIN  --   --  3.1*  --   --   --   PROT  --   --  5.3*  --   --   --   AST  --   --  52*  --   --   --   ALT  --   --  16*  --   --   --   ALKPHOS  --   --  44  --   --   --   BILITOT  --   --  1.1  --   --   --   < > = values in this interval not displayed. Estimated Creatinine Clearance: 46.6 mL/min (by C-G formula based on Cr of 1.24).   Recent Labs  10/07/15 0723 10/07/15 1139 10/07/15 1644  GLUCAP 110* 179* 206*   Assessment: Pharmacy consulted to monitor and replace electrolytes in this 82 year old male.   K is 4.2  Goal of Therapy:  Electrolytes within normal limits  Plan:  K is WNL. No supplementation needed. Will recheck K in the AM.  Thank you for allowing pharmacy to be part of this patient's care.  Ramond Dial, PharmD Clinical Pharmacist 10/07/2015,6:30 PM

## 2015-10-07 NOTE — Progress Notes (Signed)
[place] [facility] [place] NAME: [name] Good    MR#:  [id number]  DATE OF BIRTH:  June 21, 1930  SUBJECTIVE:   Patient here due to hypotension/hypoxia noted to have septic shock secondary to pneumonia. Initially patient was in intensive care unit and then transferred out yesterday. Presently still having some wheezing bronchospasm. Hemodynamically stable. Patient's family is at bedside.  REVIEW OF SYSTEMS:    Review of Systems  Constitutional: Negative for fever and chills.  HENT: Negative for congestion and tinnitus.   Eyes: Negative for blurred vision and double vision.  Respiratory: Positive for cough, shortness of breath and wheezing.   Cardiovascular: Negative for chest pain, orthopnea and PND.  Gastrointestinal: Negative for nausea, vomiting, abdominal pain and diarrhea.  Genitourinary: Negative for dysuria and hematuria.  Neurological: Positive for weakness (Generalized). Negative for dizziness, sensory change and focal weakness.  All other systems reviewed and are negative.   Nutrition: Heart Healthy/Carb modified Tolerating Diet: yes Tolerating PT: Eval noted.    DRUG ALLERGIES:   Allergies  Allergen Reactions  . Biaxin [Clarithromycin] Nausea Only    VITALS:  Blood pressure 154/92, pulse 77, temperature 97.9 F (36.6 C), temperature source Oral, resp. rate 18, height 5\' 10"  (1.778 m), weight 79.379 kg (175 lb), SpO2 93 %.  PHYSICAL EXAMINATION:   Physical Exam  GENERAL:  82 y.o.-year-old patient lying in the bed in no acute distress.  EYES: Pupils equal, round, reactive to light and accommodation. No scleral icterus. Extraocular muscles intact.  HEENT: Head atraumatic, normocephalic. Oropharynx and nasopharynx clear.  NECK:  Supple, no jugular venous distention. No thyroid enlargement, no tenderness.  LUNGS: Good air entry bilaterally, expiratory wheezing bilaterally, no rales, rhonchi. No use of accessory muscles of  respiration.  CARDIOVASCULAR: S1, S2 normal. No murmurs, rubs, or gallops.  ABDOMEN: Soft, nontender, nondistended. Bowel sounds present. No organomegaly or mass.  EXTREMITIES: No cyanosis, clubbing or edema b/l.    NEUROLOGIC: Cranial nerves II through XII are intact. No focal Motor or sensory deficits b/l.   PSYCHIATRIC: The patient is alert and oriented x 3.  SKIN: No obvious rash, lesion, or ulcer.    LABORATORY PANEL:   CBC  Recent Labs Lab 10/06/15 0511  WBC 6.9  HGB 11.1*  HCT 32.6*  PLT 95*   ------------------------------------------------------------------------------------------------------------------  Chemistries   Recent Labs Lab 10/04/15 2210  10/07/15 0430  NA 135  < > 141  K 4.5  < > 3.1*  CL 100*  < > 113*  CO2 23  < > 24  GLUCOSE 121*  < > 100*  BUN 66*  < > 30*  CREATININE 2.66*  < > 1.24  CALCIUM 7.9*  < > 8.4*  MG  --   < > 2.3  AST 52*  --   --   ALT 16*  --   --   ALKPHOS 44  --   --   BILITOT 1.1  --   --   < > = values in this interval not displayed. ------------------------------------------------------------------------------------------------------------------  Cardiac Enzymes  Recent Labs Lab 10/05/15 1014  TROPONINI 0.13*   ------------------------------------------------------------------------------------------------------------------  RADIOLOGY:  Dg Chest Port 1 View  10/06/2015  CLINICAL DATA:  Community-acquired pneumonia; aspiration pneumonia, history of CHF, previous CVA. EXAM: PORTABLE CHEST 1 VIEW COMPARISON:  Portable chest x-ray of October 05, 2015 FINDINGS: The lungs are adequately inflated. The interstitial markings remain mildly prominent especially at the right lung base but have improved overall. The cardiac  silhouette is enlarged. The pulmonary vascularity is not clearly engorged. The right internal jugular venous catheter tip projects over the proximal SVC. The bony thorax exhibits no acute abnormality. There are  multiple old healed left rib fractures. IMPRESSION: Improved pulmonary interstitial infiltrates or edema. Stable mild cardiomegaly with decreased pulmonary vascular congestion. Electronically Signed   By: David  Martinique M.D.   On: 10/06/2015 07:29     ASSESSMENT AND PLAN:   82 year old male with past medical history of diabetes, hypertension, COPD, CHF, hyperlipidemia, history of previous CVA who presents to the hospital due to hypotension, shortness of breath and noted to be septic and also noted to have pneumonia.  1. Sepsis - due to pneumonia.  Pt. Was in the ICU initially on pressors and now weaned off.  - cont. Zosyn for now and cultures so far negative. -Afebrile, hemodynamically stable.  2. Pneumonia-continue IV Zosyn. -Improving, cultures so far negative.  3. COPD-mild acute exacerbation due to the pneumonia. -Continue duo nebs scheduled, Pulmicort nebs  4. Diabetes type 2 without patient-continue Lantus, sliding scale insulin. -Blood sugar stable.  5. Dementia-continue Aricept.  6. Restless leg syndrome-continue Requip.  7. Hypokalemia-we'll continue supplement and repeat level in the morning. -Check magnesium level.  Physical therapy consult noted and likely will need short-term rehabilitation.   All the records are reviewed and case discussed with Care Management/Social Workerr. Management plans discussed with the patient, family and they are in agreement.  CODE STATUS: Full  DVT Prophylaxis: Lovenox  TOTAL TIME TAKING CARE OF THIS PATIENT: 30 minutes.   POSSIBLE D/C IN 1-2 DAYS, DEPENDING ON CLINICAL CONDITION.   Henreitta Leber M.D on 10/07/2015 at 3:15 PM  Between 7am to 6pm - Pager - [phone]  After 6pm go to [email or web address] - password [email or web address] [facility]  [place] [facility]  Office  [phone]  CC: Primary care physician; Otilio Miu, MD

## 2015-10-07 NOTE — Care Management Important Message (Signed)
Important Message  Patient Details  Name: [name] Good MRN: [id number] Date of Birth: 02/22/1931   Medicare Important Message Given:  Yes    Juliann Pulse A Marybeth Dandy 10/07/2015, 11:20 AM

## 2015-10-07 NOTE — Progress Notes (Signed)
Initial Nutrition Assessment     INTERVENTION:  Monitor intake and will ask kitchen to chop meats for pt secondary to poor teeth Recommend glucerna BID for added nutrition   NUTRITION DIAGNOSIS:   Increased nutrient needs related to acute illness as evidenced by estimated needs.    GOAL:   Patient will meet greater than or equal to 90% of their needs    MONITOR:   PO intake, Supplement acceptance  REASON FOR ASSESSMENT:   Malnutrition Screening Tool    ASSESSMENT:   82 y/o male admitted with pneumonia, sepsis, hypotension requiring pressors but have been discontinued at this time  Past Medical History  Diagnosis Date  . Diabetes mellitus without complication ([place])   . Hyperlipidemia   . Hypertension   . Stroke ([place])   . Arthritis   . Cancer ([place])   . CHF (congestive heart failure) ([place])   . Venous insufficiency   . CAD (coronary artery disease)      Pt reports poor po intake for the past 4-5 months, eating 1/2 of normal intake, no taste for food.  Reports ate breakfast this am but not sure what he had.  Noted ate 100% dinner last night and 50% breakfast  Medications reviewed aspart, lantus, NS at 172ml/hr Labs reviewed K 3.1, BUN 30, creatinine WDL, glucose 100  Nutrition-Focused physical exam completed. Findings are no fat depletion, normal to mild muscle depletion, and no edema.  Has limited teeth and trouble chewing.    Diet Order:  Diet Heart Room service appropriate?: Yes; Fluid consistency:: Thin  Skin:  Reviewed, no issues  Last BM:  4/25  Height:   Ht Readings from Last 1 Encounters:  10/04/15 5\' 10"  (1.778 m)    Weight: pt reports wt loss, typical wt about 9 months ago of 184 pounds.  Wt encounters not reflective of this weight  Wt Readings from Last 1 Encounters:  10/07/15 175 lb (79.379 kg)   Wt Readings from Last 10 Encounters:  10/07/15 175 lb (79.379 kg)  02/26/15 159 lb (72.122 kg)  02/02/15 155 lb (70.308 kg)     Ideal  Body Weight:     BMI:  Body mass index is 25.11 kg/(m^2).  Estimated Nutritional Needs:   Kcal:  [id number] kcals/d  Protein:  79-95 g/d  Fluid:  >/= 2L/d  EDUCATION NEEDS:   No education needs identified at this time  Samit Sylve B. Zenia Resides, [place], [place] (pager) Weekend/On-Call pager [phone])

## 2015-10-07 NOTE — Progress Notes (Signed)
Spoke with Dr. Verdell Carmine earlier about patient's PICC line. Patient is begging to have it removed and there is no need for it anymore since he has peripheral lines as well. MD stated okay to remove. Removed after patient finished receiving IV potassium. No complications, gauze with tegaderm in place.

## 2015-10-07 NOTE — Progress Notes (Signed)
Patient has been very restless for the past 30 minutes or so. Patient is also a little disoriented and will not sit still in the bed. He states his restless leg is getting very bad, which causes him to shake all the way up to his arms. Patient is requesting to take his requip now. Paged Dr. Verdell Carmine and MD stated okay to go ahead and give. Also gave PRN order for IV haldol if needed. Will give requip early and continue to monitor.

## 2015-10-07 NOTE — Progress Notes (Signed)
[place] NOTE  Pharmacy Consult for electrolyte monitoring and replacement   Allergies  Allergen Reactions  . Biaxin [Clarithromycin] Nausea Only   Patient Measurements: Height: 5\' 10"  (177.8 cm) Weight: 175 lb (79.379 kg) IBW/kg (Calculated) : 73  Intake/Output from previous day: 04/25 0701 - 04/26 0700 In: 1721.7 [P.O.:440; I.V.:1181.7; IV Piggyback:100] Out: 650 [Urine:650] Intake/Output from this shift: Total I/O In: -  Out: 100 [Urine:100]  Labs:  Recent Labs  10/04/15 2118  10/04/15 2210 10/05/15 0312 10/06/15 0511 10/07/15 0430  WBC 10.6  --   --  7.2 6.9  --   HGB 12.3*  --   --  10.7* 11.1*  --   HCT 36.3*  --   --  31.6* 32.6*  --   PLT 105*  --   --  85* 95*  --   CREATININE  --   < > 2.66* 2.16* 1.88* 1.24  MG  --   --   --  1.5* 2.2 2.3  PHOS  --   --   --  3.6  --   --   ALBUMIN  --   --  3.1*  --   --   --   PROT  --   --  5.3*  --   --   --   AST  --   --  52*  --   --   --   ALT  --   --  16*  --   --   --   ALKPHOS  --   --  44  --   --   --   BILITOT  --   --  1.1  --   --   --   < > = values in this interval not displayed. Estimated Creatinine Clearance: 46.6 mL/min (by C-G formula based on Cr of 1.24).   Recent Labs  10/06/15 2206 10/07/15 0434 10/07/15 0723  GLUCAP 162* 102* 110*   Assessment: Pharmacy consulted to monitor and replace electrolytes in this 82 year old male.   K is 3.1  Goal of Therapy:  Electrolytes within normal limits  Plan:  Will give KCl 10 mEq IV x 4 runs. Will reorder K @17 :00.   Thank you for allowing pharmacy to be part of this patient's care.  Larene Beach, PharmD Clinical Pharmacist 10/07/2015,10:35 AM

## 2015-10-07 NOTE — Progress Notes (Signed)
Called lab at 1700 to let them know patient no longer has a line to draw labs from. Acknowledged and stated they would send a lab tech to draw patient's potassium.

## 2015-10-07 NOTE — Progress Notes (Signed)
Patient became very disoriented and confused over night. Patient stated " I thought I was on a pirate ship." Patient had some episodes on incontinence. Patient had periods of impulsiveness. Patient is very alert and oriented this morning. Patient slept periodically throughout the night. Will continue to monitor and assess.

## 2015-10-08 ENCOUNTER — Other Ambulatory Visit: Payer: Self-pay | Admitting: *Deleted

## 2015-10-08 LAB — CBC
HEMATOCRIT: 35.5 % — AB (ref 40.0–52.0)
HEMOGLOBIN: 11.9 g/dL — AB (ref 13.0–18.0)
MCH: 30.3 pg (ref 26.0–34.0)
MCHC: 33.7 g/dL (ref 32.0–36.0)
MCV: 89.9 fL (ref 80.0–100.0)
Platelets: 96 10*3/uL — ABNORMAL LOW (ref 150–440)
RBC: 3.95 MIL/uL — AB (ref 4.40–5.90)
RDW: 15 % — ABNORMAL HIGH (ref 11.5–14.5)
WBC: 6.4 10*3/uL (ref 3.8–10.6)

## 2015-10-08 LAB — GLUCOSE, CAPILLARY
GLUCOSE-CAPILLARY: 60 mg/dL — AB (ref 65–99)
GLUCOSE-CAPILLARY: 88 mg/dL (ref 65–99)
GLUCOSE-CAPILLARY: 191 mg/dL — AB (ref 65–99)
GLUCOSE-CAPILLARY: 263 mg/dL — AB (ref 65–99)
Glucose-Capillary: 68 mg/dL (ref 65–99)
Glucose-Capillary: 105 mg/dL — ABNORMAL HIGH (ref 65–99)
Glucose-Capillary: 99 mg/dL (ref 65–99)

## 2015-10-08 LAB — BASIC METABOLIC PANEL
ANION GAP: 7 (ref 5–15)
BUN: 15 mg/dL (ref 6–20)
CO2: 24 mmol/L (ref 22–32)
Calcium: 9.1 mg/dL (ref 8.9–10.3)
Chloride: 115 mmol/L — ABNORMAL HIGH (ref 101–111)
Creatinine, Ser: 0.95 mg/dL (ref 0.61–1.24)
GFR calc Af Amer: >60 mL/min (ref >60)
GLUCOSE: 86 mg/dL (ref 65–99)
POTASSIUM: 3.9 mmol/L (ref 3.5–5.1)
SODIUM: 146 mmol/L — AB (ref 135–145)

## 2015-10-08 LAB — URINE CULTURE: CULTURE: NO GROWTH

## 2015-10-08 LAB — MAGNESIUM: MAGNESIUM: 2.3 mg/dL (ref 1.7–2.4)

## 2015-10-08 MED ORDER — INSULIN ASPART 100 UNIT/ML ~~LOC~~ SOLN: 0.0000 [IU] | Freq: Every day | SUBCUTANEOUS | Status: DC

## 2015-10-08 MED ORDER — INSULIN ASPART 100 UNIT/ML ~~LOC~~ SOLN: 15.0000 [IU] | Freq: Three times a day (TID) | SUBCUTANEOUS | Status: DC

## 2015-10-08 MED ORDER — AMOXICILLIN-POT CLAVULANATE 875-125 MG PO TABS
1.0000 | ORAL_TABLET | Freq: Two times a day (BID) | ORAL | Status: DC
  Administered 2015-10-09 – 2015-10-11 (×5): 1 via ORAL
  Filled 2015-10-08 (×5): qty 1

## 2015-10-08 MED ORDER — INSULIN ASPART 100 UNIT/ML ~~LOC~~ SOLN
0.0000 [IU] | Freq: Three times a day (TID) | SUBCUTANEOUS | Status: DC
  Administered 2015-10-08: 3 [IU] via SUBCUTANEOUS
  Administered 2015-10-09: 5 [IU] via SUBCUTANEOUS
  Administered 2015-10-09: 3 [IU] via SUBCUTANEOUS
  Administered 2015-10-10: 5 [IU] via SUBCUTANEOUS
  Administered 2015-10-10: 3 [IU] via SUBCUTANEOUS
  Administered 2015-10-11: 11 [IU] via SUBCUTANEOUS
  Filled 2015-10-08 (×2): qty 3
  Filled 2015-10-08: qty 5
  Filled 2015-10-08: qty 11
  Filled 2015-10-08: qty 3
  Filled 2015-10-08: qty 5

## 2015-10-08 MED ORDER — INSULIN ASPART 100 UNIT/ML ~~LOC~~ SOLN
0.0000 [IU] | Freq: Every day | SUBCUTANEOUS | Status: DC
  Administered 2015-10-08 – 2015-10-10 (×2): 3 [IU] via SUBCUTANEOUS
  Filled 2015-10-08 (×2): qty 3

## 2015-10-08 NOTE — Progress Notes (Signed)
MD and CM would like patient re-evaluated by PT; PT aware and he is on their list to see today.

## 2015-10-08 NOTE — Care Management (Signed)
Anticipate will be medically stable for discharge 4/28. Patient is agreeable for [facility] services at discharge.  Physical therapy again recommends skilled nursing.   Patient  observed safety ques and maintain 02 sats. Patient participated with therapy but did not ambulate.  He does not communicate with CM regarding any discharge plan.  Spoke with patient's grand daughter [name] regarding anticipated discharge for 4/28 and  therapy continued recommendation for skilled nursing and if patient "has not ambulated" must make sure patient's functional status can be managed round the clock in the home.  [name] is just getting off work and will come and speak with patient.

## 2015-10-08 NOTE — Progress Notes (Signed)
[facility] [place] NAME: [name] Good    MR#:  [id number]  DATE OF BIRTH:  [birth year]  SUBJECTIVE:   Patient here due to hypotension/hypoxia noted to have septic shock secondary to pneumonia. Still quite weak. Wheezing/Bronchospams much improved since yesterday.    REVIEW OF SYSTEMS:    Review of Systems  Constitutional: Negative for fever and chills.  HENT: Negative for congestion and tinnitus.   Eyes: Negative for blurred vision and double vision.  Respiratory: Positive for cough, shortness of breath and wheezing.   Cardiovascular: Negative for chest pain, orthopnea and PND.  Gastrointestinal: Negative for nausea, vomiting, abdominal pain and diarrhea.  Genitourinary: Negative for dysuria and hematuria.  Neurological: Positive for weakness (Generalized). Negative for dizziness, sensory change and focal weakness.  All other systems reviewed and are negative.   Nutrition: Heart Healthy/Carb modified Tolerating Diet: yes Tolerating PT: Eval noted.    DRUG ALLERGIES:   Allergies  Allergen Reactions  . Biaxin [Clarithromycin] Nausea Only    VITALS:  Blood pressure 160/79, pulse 74, temperature 98 F (36.7 C), temperature source Oral, resp. rate 24, height 5\' 10"  (1.778 m), weight 81.602 kg (179 lb 14.4 oz), SpO2 97 %.  PHYSICAL EXAMINATION:   Physical Exam  GENERAL:  82 y.o.-year-old patient lying in the bed in no acute distress.  EYES: Pupils equal, round, reactive to light and accommodation. No scleral icterus. Extraocular muscles intact.  HEENT: Head atraumatic, normocephalic. Oropharynx and nasopharynx clear.  NECK:  Supple, no jugular venous distention. No thyroid enlargement, no tenderness.  LUNGS: Good air entry bilaterally, minimal end expiratory wheezing bilaterally, no rales, rhonchi. No use of accessory muscles of respiration.  CARDIOVASCULAR: S1, S2 normal. No murmurs, rubs, or gallops.  ABDOMEN: Soft, nontender,  nondistended. Bowel sounds present. No organomegaly or mass.  EXTREMITIES: No cyanosis, clubbing or edema b/l.    NEUROLOGIC: Cranial nerves II through XII are intact. No focal Motor or sensory deficits b/l.  Globally weak PSYCHIATRIC: The patient is alert and oriented x 1.  SKIN: No obvious rash, lesion, or ulcer.    LABORATORY PANEL:   CBC  Recent Labs Lab 10/08/15 0458  WBC 6.4  HGB 11.9*  HCT 35.5*  PLT 96*   ------------------------------------------------------------------------------------------------------------------  Chemistries   Recent Labs Lab 10/04/15 2210  10/08/15 0458  NA 135  < > 146*  K 4.5  < > 3.9  CL 100*  < > 115*  CO2 23  < > 24  GLUCOSE 121*  < > 86  BUN 66*  < > 15  CREATININE 2.66*  < > 0.95  CALCIUM 7.9*  < > 9.1  MG  --   < > 2.3  AST 52*  --   --   ALT 16*  --   --   ALKPHOS 44  --   --   BILITOT 1.1  --   --   < > = values in this interval not displayed. ------------------------------------------------------------------------------------------------------------------  Cardiac Enzymes  Recent Labs Lab 10/05/15 1014  TROPONINI 0.13*   ------------------------------------------------------------------------------------------------------------------  RADIOLOGY:  No results found.   ASSESSMENT AND PLAN:   82 year old male with past medical history of diabetes, hypertension, COPD, CHF, hyperlipidemia, history of previous CVA who presents to the hospital due to hypotension, shortness of breath and noted to be septic and also noted to have pneumonia.  1. Sepsis - due to pneumonia.  Pt. Was in the ICU initially on pressors and now weaned off.  -  Continue Zosyn and will narrow to oral Augmentin upon discharge. Cultures so far negative. -Afebrile, hemodynamically stable.  2. Pneumonia-continue IV Zosyn. -Changed to oral Augmentin upon discharge. -Improving, cultures so far negative.  3. COPD-mild acute exacerbation due to the  pneumonia. -Continue duo nebs scheduled, Pulmicort nebs -Improving  4. Diabetes type 2 without patient-continue Lantus, SSI and follow BS.  - appreciate diabetes coordinator input.   5. Dementia-continue Aricept. - cont. Haldol PRN for agitation.  6. Restless leg syndrome-continue Requip.  7. Hypokalemia-improved w/ supplementation.  - Mg. Level normal.   Physical therapy consult noted but the patient's family does not want the patient is going to rehabilitation. Patient to go home with home health services. We'll have PT re-evaluate the patient today.   All the records are reviewed and case discussed with Care Management/Social Workerr. Management plans discussed with the patient, family and they are in agreement.  CODE STATUS: Full  DVT Prophylaxis: Lovenox  TOTAL TIME TAKING CARE OF THIS PATIENT: 30 minutes.   POSSIBLE D/C IN 1-2 DAYS, DEPENDING ON CLINICAL CONDITION.   Henreitta Leber M.D on 10/08/2015 at 1:28 PM  Between 7am to 6pm - Pager - [phone]  After 6pm go to [email or web address] - password [email or web address] [facility]  [facility]  Office  [phone]  CC: Primary care physician; Otilio Miu, MD

## 2015-10-08 NOTE — Clinical Social Work Note (Signed)
CSW has seen PT recommendation for rehab however Rn CM has documented that patient and family have declined this and are to return home with home health.  Shela Leff MSW,LCSW [phone]

## 2015-10-08 NOTE — Progress Notes (Signed)
Patient's Blood Sugar was 60. 4 oz given will recheck in 30 minutes, will continue to monitor.

## 2015-10-08 NOTE — Progress Notes (Addendum)
Inpatient Diabetes Program Recommendations  AACE/ADA: New Consensus Statement on Inpatient Glycemic Control (2015)  Target Ranges:  Prepandial:   less than 140 mg/dL      Peak postprandial:   less than 180 mg/dL (1-2 hours)      Critically ill patients:  140 - 180 mg/dL   Review of Glycemic Control Results for TALLY, EHLKE (MRN [id number]) as of 10/08/2015 12:51  Ref. Range 10/08/2015 02:09 10/08/2015 05:21 10/08/2015 06:23 10/08/2015 07:38 10/08/2015 11:46  Glucose-Capillary Latest Ref Range: 65-99 mg/dL 60 (L) 68 105 (H) 88 99     Diabetes history: DM2 Outpatient Diabetes medications: Lantus 20 units QAM, Novolin R 0-12 units TID with meals, Metformin 500 mg BID Current orders for Inpatient glycemic control: Lantus 20 units QAM, Novolog 0-15 units ACHS & 2am  Inpatient Diabetes Program Recommendations:  Consider d/c Novolog insulin 0-15 units tid, hs and 2am-  Consider changing it to Novolog 0-15 units tid and Novolog 0-5 units qhs    Gentry Fitz, RN, [place], [place], CDE Diabetes Coordinator Inpatient Diabetes Program  [phone] (Team Pager) [phone] ([place]) 10/08/2015 1:20 PM

## 2015-10-08 NOTE — Progress Notes (Signed)
[place] for electrolyte monitoring and replacement   Allergies  Allergen Reactions  . Biaxin [Clarithromycin] Nausea Only   Patient Measurements: Height: 5\' 10"  (177.8 cm) Weight: 179 lb 14.4 oz (81.602 kg) IBW/kg (Calculated) : 73  Intake/Output from previous day: 04/26 0701 - 04/27 0700 In: -  Out: 851 [Urine:850; Stool:1] Intake/Output from this shift: Total I/O In: -  Out: 50 [Urine:50]  Labs:  Recent Labs  10/06/15 0511 10/07/15 0430 10/08/15 0458  WBC 6.9  --  6.4  HGB 11.1*  --  11.9*  HCT 32.6*  --  35.5*  PLT 95*  --  96*  CREATININE 1.88* 1.24 0.95  MG 2.2 2.3 2.3   Estimated Creatinine Clearance: 60.8 mL/min (by C-G formula based on Cr of 0.95).   Recent Labs  10/08/15 0521 10/08/15 0623 10/08/15 0738  GLUCAP 68 105* 88   Assessment: Pharmacy consulted to monitor and replace electrolytes in this 82 year old male.   K: 3.9, Mag: 2.3  Goal of Therapy:  Electrolytes within normal limits  Plan:  Labs within normal limits.  No supplementation necessary. Follow up with AM labs.   Thank you for allowing pharmacy to be part of this patient's care.  Loleta Dicker, PharmD Clinical Pharmacist 10/08/2015,8:37 AM

## 2015-10-08 NOTE — Progress Notes (Signed)
Physical Therapy Treatment Patient Details Name: [name] Good MRN: [id number] DOB: 1931/02/20 Today's Date: 10/08/2015    History of Present Illness 82 yo M presented to ED on 4/23 after graddaughter found him to be hypotensive. He was found to have septic shock. PMH includes DM, stroke, cancer, CHF, CAD.    PT Comments    Pt lethargic initially, but easily awoken. Pt found with O2 nasal cannula on top of pt's head; O2 saturation 90% at rest and improved to 94% with proper placement. Pt continues to require Min A for bed mobility and moves very slowly. Pt participates in edge of bed seated exercises requiring rest in between due to fatigue in Bilateral lower extremities. Pt did not feel able to ambulate today, but does participate in stand exercises. Initial attempts at stand require Min A, but improve to Min guard with subsequent stands. Pt does consistently require safety cues. Pt requires seated rest between all stand exercises for O2 saturation and heart rate management. Desaturation on 2 liters to 90% with noted shortness of breath and increase in heart rate from 74/75 up to 111 at times with stand activities. Pt returned to bed comfortably. Continue PT to progress strength, safety and endurance to improve all functional mobility.   Follow Up Recommendations  SNF     Equipment Recommendations  None recommended by PT    Recommendations for Other Services       Precautions / Restrictions Precautions Precautions: Fall Restrictions Weight Bearing Restrictions: No    Mobility  Bed Mobility Overal bed mobility: Needs Assistance Bed Mobility: Supine to Sit     Supine to sit: HOB elevated;Min assist     General bed mobility comments: Assist for trunk  Transfers Overall transfer level: Needs assistance Equipment used: Rolling walker (2 wheeled) Transfers: Sit to/from Stand Sit to Stand: Min assist         General transfer comment: Cues for safe hand placement; takes  several attempts with Min guard; ultimately stands with Min A 1st time. Improved to Min guard 2nd and 3rd  Ambulation/Gait             General Gait Details: pt did not feel steady enough for gait. Performed stand in place march,exercises.   Stairs            Wheelchair Mobility    Modified Rankin (Stroke Patients Only)       Balance Overall balance assessment: Needs assistance Sitting-balance support: Feet supported Sitting balance-Leahy Scale: Good Sitting balance - Comments: rounded back/shoulders   Standing balance support: Bilateral upper extremity supported Standing balance-Leahy Scale: Fair Standing balance comment: forward flexed/rounded shoulders/back                    Cognition Arousal/Alertness: Lethargic (able to awaken) Behavior During Therapy: WFL for tasks assessed/performed Overall Cognitive Status: Within Functional Limits for tasks assessed                      Exercises General Exercises - Lower Extremity Long Arc Quad: AROM;Both;10 reps;Seated (2 sets) Hip ABduction/ADduction: Strengthening;Both;10 reps;Standing (2 sets) Straight Leg Raises: Strengthening;Both;10 reps;Standing (2 sets) Hip Flexion/Marching: AROM;Strengthening;10 reps;Seated;Standing (2 sets each) Toe Raises: AROM;Both;20 reps;Seated Heel Raises: AROM;Both;20 reps;Seated Other Exercises Other Exercises: requires seated rest between exercises to imrove O2 saturation and decrease heart rate. O2 desaturation on 2 liters to 90% and HR increases up to 111    General Comments        Pertinent Vitals/Pain  Pain Assessment:  (does not quantify; off and on c/o BLE pain)    Home Living                      Prior Function            PT Goals (current goals can now be found in the care plan section) Progress towards PT goals: Progressing toward goals (slowly)    Frequency  Min 2X/week    PT Plan Current plan remains appropriate     Co-evaluation             End of Session   Activity Tolerance: Patient limited by fatigue (cardiopulmonary issues) Patient left: in bed;with call bell/phone within reach;with bed alarm set     Time: [id number] PT Time Calculation (min) (ACUTE ONLY): 32 min  Charges:  $Therapeutic Exercise: 8-22 mins $Therapeutic Activity: 8-22 mins                    G CodesCharlaine Dalton, PTA 10/08/2015, 4:22 PM

## 2015-10-08 NOTE — Consult Note (Addendum)
   [facility] CM Inpatient Consult   10/08/2015  [name] Good [birth year] [id number]   Referral received from inpatient case manager, diagnosis of DM, HF, and HTN for post hospital discharge follow up. Patient was evaluated for community based chronic disease management services with [facility] care Management Program as a benefit of patient's [employer] Medicare. Met with the patient at the bedside to explain [place] Management services. Patient endorses his primary care provider to be Dr. Otilio Miu. Patient states he is planning on going home with home health. Consent form signed.  Patient states the best number to contact him is [phone] and gives permission to speak to his granddaughter Berton Mount at [phone]. He also asked me to call his granddaughter and explaing [employer] services to her. Made a call to the granddaughter [name]. She stated she was his care giver and lived right around the corner form patient. Granddaughter stated the patient does get confused at times so when the nurse calls if something doesn't make sense please call her. Patient's granddaughter stated she fills patient's pill box and the nurse would need to talk her about his medications.Granddaughter agreeable to services for patient also. [facility] Patient will receive post hospital discharge calls and be evaluated for monthly home visits. [facility] Care Management services does not interfere with or replace any services arranged by the inpatient care management team. RNCM left contact information and [facility] literature at the bedside. Made inpatient RNCM aware that [facility] will be following for care management. For additional questions please contact:   Reg Bircher RN, [facility] Liaison  [phone]) Business Mobile [phone]) Toll free office

## 2015-10-08 NOTE — Progress Notes (Signed)
[place] for electrolyte monitoring and replacement   Allergies  Allergen Reactions  . Biaxin [Clarithromycin] Nausea Only   Patient Measurements: Height: 5\' 10"  (177.8 cm) Weight: 179 lb 14.4 oz (81.602 kg) IBW/kg (Calculated) : 73  Intake/Output from previous day: 04/26 0701 - 04/27 0700 In: 2398.3 [I.V.:2398.3] Out: 851 [Urine:850; Stool:1] Intake/Output from this shift: Total I/O In: 600 [P.O.:240; I.V.:210; IV Piggyback:150] Out: 250 [Urine:250]  Labs:  Recent Labs  10/06/15 0511 10/07/15 0430 10/08/15 0458  WBC 6.9  --  6.4  HGB 11.1*  --  11.9*  HCT 32.6*  --  35.5*  PLT 95*  --  96*  CREATININE 1.88* 1.24 0.95  MG 2.2 2.3 2.3   Estimated Creatinine Clearance: 60.8 mL/min (by C-G formula based on Cr of 0.95).   Recent Labs  10/08/15 0623 10/08/15 0738 10/08/15 1146  GLUCAP 105* 88 99   Assessment: Pharmacy consulted to monitor and replace electrolytes in this 82 year old male.   Goal of Therapy:  Electrolytes within normal limits  Plan:  Labs within normal limits.  No supplementation necessary. Follow up with AM labs.   Thank you for allowing pharmacy to be part of this patient's care.  Vira Blanco, PharmD Clinical Pharmacist 10/08/2015,1:25 PM

## 2015-10-08 NOTE — Progress Notes (Signed)
Patient's BS is now 105. Patient stated that he is very sensitive to insulin. Will continue to monitor.

## 2015-10-09 ENCOUNTER — Inpatient Hospital Stay: Payer: Medicare Other

## 2015-10-09 LAB — GLUCOSE, CAPILLARY
GLUCOSE-CAPILLARY: 71 mg/dL (ref 65–99)
GLUCOSE-CAPILLARY: 187 mg/dL — AB (ref 65–99)
Glucose-Capillary: 230 mg/dL — ABNORMAL HIGH (ref 65–99)
Glucose-Capillary: 199 mg/dL — ABNORMAL HIGH (ref 65–99)

## 2015-10-09 LAB — BASIC METABOLIC PANEL
Anion gap: 7 (ref 5–15)
BUN: 13 mg/dL (ref 6–20)
CO2: 23 mmol/L (ref 22–32)
Calcium: 9.1 mg/dL (ref 8.9–10.3)
Chloride: 114 mmol/L — ABNORMAL HIGH (ref 101–111)
Creatinine, Ser: 0.96 mg/dL (ref 0.61–1.24)
GFR calc Af Amer: >60 mL/min (ref >60)
GFR calc non Af Amer: >60 mL/min (ref >60)
GLUCOSE: 102 mg/dL — AB (ref 65–99)
Potassium: 3.8 mmol/L (ref 3.5–5.1)
Sodium: 144 mmol/L (ref 135–145)

## 2015-10-09 LAB — CULTURE, BLOOD (ROUTINE X 2)
CULTURE: NO GROWTH
CULTURE: NO GROWTH

## 2015-10-09 LAB — MAGNESIUM: Magnesium: 2.3 mg/dL (ref 1.7–2.4)

## 2015-10-09 LAB — PHOSPHORUS: PHOSPHORUS: 2.5 mg/dL (ref 2.5–4.6)

## 2015-10-09 MED ORDER — FUROSEMIDE 80 MG PO TABS: 80.0000 mg | ORAL_TABLET | Freq: Two times a day (BID) | ORAL | Status: DC

## 2015-10-09 MED ORDER — DILTIAZEM HCL ER COATED BEADS 180 MG PO CP24: 180.0000 mg | ORAL_CAPSULE | Freq: Every day | ORAL | Status: DC

## 2015-10-09 MED ORDER — DOXAZOSIN MESYLATE 4 MG PO TABS
4.0000 mg | ORAL_TABLET | Freq: Every day | ORAL | Status: DC
  Administered 2015-10-09 – 2015-10-11 (×3): 4 mg via ORAL
  Filled 2015-10-09 (×3): qty 1

## 2015-10-09 MED ORDER — PREDNISONE 10 MG PO TABS: ORAL_TABLET | ORAL | Status: DC

## 2015-10-09 MED ORDER — FUROSEMIDE 40 MG PO TABS
40.0000 mg | ORAL_TABLET | Freq: Two times a day (BID) | ORAL | Status: DC
  Administered 2015-10-10: 40 mg via ORAL
  Filled 2015-10-09: qty 1

## 2015-10-09 MED ORDER — FUROSEMIDE 10 MG/ML IJ SOLN
40.0000 mg | Freq: Once | INTRAMUSCULAR | Status: AC
  Administered 2015-10-09: 40 mg via INTRAVENOUS
  Filled 2015-10-09: qty 4

## 2015-10-09 MED ORDER — AMOXICILLIN-POT CLAVULANATE 875-125 MG PO TABS: 1.0000 | ORAL_TABLET | Freq: Two times a day (BID) | ORAL | Status: DC

## 2015-10-09 NOTE — Clinical Social Work Note (Addendum)
Clinical Social Work Assessment  Patient Details  Name: [name] Good MRN: [id number] Date of Birth: [birth year]  Date of referral:  10/09/15               Reason for consult:  Facility Placement                Permission sought to share information with:  Facility Sport and exercise psychologist, Family Supports Permission granted to share information::  Yes, Verbal Permission Granted  Name::        Agency::     Relationship::     Contact Information:     Housing/Transportation Living arrangements for the past 2 months:  Single Family Home Source of Information:  Patient Patient Interpreter Needed:  None Criminal Activity/Legal Involvement Pertinent to Current Situation/Hospitalization:  No - Comment as needed Significant Relationships:  Other Family Members Lives with:  Self Do you feel safe going back to the place where you live?  Yes Need for family participation in patient care:  Yes (Comment)  Care giving concerns:  Patient resides alone.   Social Worker assessment / plan:  CSW asked by RN CM to begin work on [facility] as patient is now agreeable for rehab. PT has recommended rehab and patient was initially declining. Patient's grandaughter, [name], arrived to the hospital and spoke with patient and he was in agreement with STR. CSW went to patient's room to complete assessment and patient was eating his lunch and had a male family member in the room who was upset regarding patient's impending discharge this afternoon. Patient was able to tell CSW that he was in agreement with STR but that he did want me to talk to his grandaughter, [name]. CSW explained that a bedsearch would be initiated and that I would return to let him know what facilities were able to offer. Patient verbalized agreement.  Employment status:  Retired Nurse, adult PT Recommendations:  [place] / Referral to community resources:  [place]  Patient/Family's Response to care:  Patient was appreciative of CSW assistance.  Patient/Family's Understanding of and Emotional Response to Diagnosis, Current Treatment, and Prognosis:  Patient was agreeable to STR but the male family member in patient's room was not. Meanwhile, as CSW was conducting bedsearch, patient had a choking episode with his sandwich and discharge has been postponed.   Emotional Assessment Appearance:  Appears stated age Attitude/Demeanor/Rapport:   (pleasant and cooperative) Affect (typically observed):  Accepting, Adaptable, Calm Orientation:  Oriented to Self, Oriented to Place, Oriented to  Time, Oriented to Situation Alcohol / Substance use:  Not Applicable Psych involvement (Current and /or in the community):  No (Comment)  Discharge Needs  Concerns to be addressed:  Care Coordination Readmission within the last 30 days:  No Current discharge risk:  None Barriers to Discharge:  No Barriers Identified   Shela Leff, LCSW 10/09/2015, 2:15 PM

## 2015-10-09 NOTE — Care Management Important Message (Signed)
Important Message  Patient Details  Name: [name] Good MRN: [id number] Date of Birth: [birth year]   Medicare Important Message Given:  Yes    Katrina Stack, RN 10/09/2015, 8:28 AM

## 2015-10-09 NOTE — Care Management (Signed)
Received voicemail message from [place] late pm 4/27.  She stated that the nurse told her was unaware of any discharge plans for 4/28 and would like for patient to be seen by physical therapy one more time before making a decision.  SM contacted primary nurse approx 6pm 4/27 to discuss the anticipated discharge date of 4/28 if medically stable  and of the need to have physical therapy see patient 4/28 morning.   Addressed this again this morning with primary nurse.  Spoke with patient and strongly encouraged him to make all attempts to perform ambulation with therapy this morning.  Updated CSW

## 2015-10-09 NOTE — Progress Notes (Signed)
Status post breathing treatment, pts oxygen saturation is 91-92 % and lungs show rhonchi and exp wheeze to ascultation. Respiratory notified to assess patient. Will continue to assess.

## 2015-10-09 NOTE — Progress Notes (Signed)
SNF Benefits:  Number called: [phone] Rep: Corrie Reference Number: [id number]  [employer] Medicare Complete Choice PPO plan active as of 06/14/15 with no deductible.  In-network out of pocket max is $4500 and the in/out-of-network combined out of pocket max is $10,000. $40 met so far towards both amounts.  In-network SNF: $0 copay for days 1-20, a $160 daily copay for days 21-49, and a $0 copay for days 50-100.  Once out of pocket is reached, patient covered at 100% for remainder of 100 day benefit period.  $0 copay for professional fees and 3 day hospital stay is not required.  Josem Kaufmann is required: [phone].   Out-of-network SNF: $195 copay per day for days 1-52 and $0 copay for days 53-100.   Once out of pocket is reached, patient covered at 100% for remainder of 100 day benefit period.  $0 copay for professional fees and 3 day hospital stay is not required.  Josem Kaufmann is required: [phone].

## 2015-10-09 NOTE — NC FL2 (Signed)
[place] LEVEL OF CARE SCREENING TOOL     IDENTIFICATION  Patient Name: [name] Good Birthdate: 11/25/1930 Sex: male Admission Date (Current Location): 10/04/2015  [place] and [place] Number:  Engineering geologist and Address:  [facility], [address] [place], [place] [address]      Provider Number: [id number]  Attending Physician Name and Address:  Henreitta Leber, MD  Relative Name and Phone Number:       Current Level of Care: Hospital Recommended Level of Care: [place] Prior Approval Number:    Date Approved/Denied:   PASRR Number:    Discharge Plan: SNF    Current Diagnoses: Patient Active Problem List   Diagnosis Date Noted  . Septic shock ([place]) 10/05/2015  . Aspiration pneumonia of right lower lobe ([place])   . Acute on chronic respiratory failure with hypoxia and hypercapnia (H[facility])   . Abdominal pain, acute, left upper quadrant 02/02/2015    Orientation RESPIRATION BLADDER Height & Weight     Self, Time, Situation, Place  Normal, O2 (2 liters) Continent Weight: 173 lb 14.4 oz (78.881 kg) Height:  5\' 10"  (177.8 cm)  BEHAVIORAL SYMPTOMS/MOOD NEUROLOGICAL BOWEL NUTRITION STATUS   (none)  (none) Continent Diet (heart healthy,carb modified)  AMBULATORY STATUS COMMUNICATION OF NEEDS Skin   Limited Assist Verbally Normal                       Personal Care Assistance Level of Assistance  Bathing, Dressing Bathing Assistance: Limited assistance   Dressing Assistance: Limited assistance     Functional Limitations Info   (none )          SPECIAL CARE FACTORS FREQUENCY  PT (By licensed PT)                    Contractures Contractures Info: Not present    Additional Factors Info  Code Status, Allergies Code Status Info: full Allergies Info: biaxin           Current Medications (10/09/2015):  This is the current hospital active medication list Current  Facility-Administered Medications  Medication Dose Route Frequency Provider Last Rate Last Dose  . 0.9 %  sodium chloride infusion  250 mL Intravenous PRN Mikael Spray, NP      . amoxicillin-clavulanate (AUGMENTIN) 875-125 MG per tablet 1 tablet  1 tablet Oral Q12H Henreitta Leber, MD   1 tablet at 10/09/15 0942  . aspirin EC tablet 325 mg  325 mg Oral Daily Mikael Spray, NP   325 mg at 10/09/15 0942  . atorvastatin (LIPITOR) tablet 10 mg  10 mg Oral Daily Mikael Spray, NP   10 mg at 10/09/15 0943  . benzonatate (TESSALON) capsule 100 mg  100 mg Oral BID PRN Harrie Foreman, MD   100 mg at 10/08/15 0901  . budesonide (PULMICORT) nebulizer solution 0.25 mg  0.25 mg Nebulization BID Henreitta Leber, MD   0.25 mg at 10/09/15 0741  . cyclobenzaprine (FLEXERIL) tablet 5 mg  5 mg Oral TID PRN Henreitta Leber, MD   5 mg at 10/08/15 2040  . diltiazem (CARDIZEM CD) 24 hr capsule 180 mg  180 mg Oral Daily Anders Simmonds, MD   180 mg at 10/09/15 0943  . donepezil (ARICEPT) tablet 10 mg  10 mg Oral QHS Mikael Spray, NP   10 mg at 10/08/15 2103  . enoxaparin (LOVENOX) injection 40 mg  40 mg Subcutaneous Q24H Henreitta Leber, MD   40 mg at 10/08/15 2102  . feeding supplement (GLUCERNA SHAKE) (GLUCERNA SHAKE) liquid 237 mL  237 mL Oral BID BM Henreitta Leber, MD   237 mL at 10/09/15 1000  . haloperidol lactate (HALDOL) injection 2.5 mg  2.5 mg Intravenous Q6H PRN Henreitta Leber, MD      . insulin aspart (novoLOG) injection 0-15 Units  0-15 Units Subcutaneous TID WC Henreitta Leber, MD   3 Units at 10/09/15 1200  . insulin aspart (novoLOG) injection 0-5 Units  0-5 Units Subcutaneous QHS Henreitta Leber, MD   3 Units at 10/08/15 2102  . insulin glargine (LANTUS) injection 20 Units  20 Units Subcutaneous q morning - 10a Mikael Spray, NP   20 Units at 10/09/15 0943  . ipratropium-albuterol (DUONEB) 0.5-2.5 (3) MG/3ML nebulizer solution 3 mL  3 mL Nebulization Q6H Henreitta Leber, MD    3 mL at 10/09/15 0741  . ondansetron (ZOFRAN) injection 4 mg  4 mg Intravenous Q6H PRN Mikael Spray, NP      . pramipexole (MIRAPEX) tablet 1.5 mg  1.5 mg Oral Daily Mikael Spray, NP   1.5 mg at 10/09/15 0942  . rOPINIRole (REQUIP) tablet 2 mg  2 mg Oral QHS Mikael Spray, NP   2 mg at 10/08/15 2103     Discharge Medications: Please see discharge summary for a list of discharge medications.  Relevant Imaging Results:  Relevant Lab Results:   Additional Information SS: [id number]  Shela Leff, LCSW

## 2015-10-09 NOTE — Clinical Social Work Note (Signed)
CSW obtained, patient's grandaughter's number: [name] Good: work: [phone]. CSW contacted [name] Good and explained the purpose of my call and that I had been informed by RN CM that patient was now agreeable for rehab. As I began explaining the process, [name] Good interjected and that she had stated that a bedsearch could be done but that rehab was not to be discussed with her grandfather until they had talked it over with the family. She states that patient has incurred several financial bills and that they have just paid them off after they were sent to collections by [facility]. [name] Good stated that she needs to know if there will be a copayment for patient to receive STR because if there is, they will most likely not choose rehab as she knows that due to patient's hospital bill, that he will have more bills headed his way. CSW explained that we could check patient's STR benefits and then call her back with that information. She verbalized understanding. Shela Leff MSW,LCSW [phone]

## 2015-10-09 NOTE — Care Management (Signed)
Provided grand daughter [name] contact information for [facility] financial counselors and [employer]  for [employer].

## 2015-10-09 NOTE — Progress Notes (Signed)
[place] at [place] NAME: [name] Good    MR#:  [id number]  DATE OF BIRTH:  24-Feb-1931  SUBJECTIVE:   Patient here due to hypotension/hypoxia noted to have septic shock secondary to pneumonia. Patient had episode of choking on some food and develop bronchospasm shortly thereafter.  REVIEW OF SYSTEMS:    Review of Systems  Constitutional: Negative for fever and chills.  HENT: Negative for congestion and tinnitus.   Eyes: Negative for blurred vision and double vision.  Respiratory: Positive for cough, shortness of breath and wheezing.   Cardiovascular: Negative for chest pain, orthopnea and PND.  Gastrointestinal: Negative for nausea, vomiting, abdominal pain and diarrhea.  Genitourinary: Negative for dysuria and hematuria.  Neurological: Positive for weakness (Generalized). Negative for dizziness, sensory change and focal weakness.  All other systems reviewed and are negative.   Nutrition: Heart Healthy/Carb modified Tolerating Diet: yes Tolerating PT: Eval noted.    DRUG ALLERGIES:   Allergies  Allergen Reactions  . Biaxin [Clarithromycin] Nausea Only    VITALS:  Blood pressure 153/89, pulse 127, temperature 98.1 F (36.7 C), temperature source Oral, resp. rate 18, height 5\' 10"  (1.778 m), weight 78.881 kg (173 lb 14.4 oz), SpO2 95 %.  PHYSICAL EXAMINATION:   Physical Exam  GENERAL:  82 y.o.-year-old patient lying in the bed in mild acute distress.  EYES: Pupils equal, round, reactive to light and accommodation. No scleral icterus. Extraocular muscles intact.  HEENT: Head atraumatic, normocephalic. Oropharynx and nasopharynx clear.  NECK:  Supple, no jugular venous distention. No thyroid enlargement, no tenderness.  LUNGS: Good air entry bilaterally, diffuse rhonchi/wheezing b/l. No use of accessory muscles of respiration.  CARDIOVASCULAR: S1, S2 normal. No murmurs, rubs, or gallops.  ABDOMEN: Soft, nontender, nondistended.  Bowel sounds present. No organomegaly or mass.  EXTREMITIES: No cyanosis, clubbing or edema b/l.    NEUROLOGIC: Cranial nerves II through XII are intact. No focal Motor or sensory deficits b/l.  Globally weak PSYCHIATRIC: The patient is alert and oriented x 1.  SKIN: No obvious rash, lesion, or ulcer.    LABORATORY PANEL:   CBC  Recent Labs Lab 10/08/15 0458  WBC 6.4  HGB 11.9*  HCT 35.5*  PLT 96*   ------------------------------------------------------------------------------------------------------------------  Chemistries   Recent Labs Lab 10/04/15 2210  10/09/15 0502  NA 135  < > 144  K 4.5  < > 3.8  CL 100*  < > 114*  CO2 23  < > 23  GLUCOSE 121*  < > 102*  BUN 66*  < > 13  CREATININE 2.66*  < > 0.96  CALCIUM 7.9*  < > 9.1  MG  --   < > 2.3  AST 52*  --   --   ALT 16*  --   --   ALKPHOS 44  --   --   BILITOT 1.1  --   --   < > = values in this interval not displayed. ------------------------------------------------------------------------------------------------------------------  Cardiac Enzymes  Recent Labs Lab 10/05/15 1014  TROPONINI 0.13*   ------------------------------------------------------------------------------------------------------------------  RADIOLOGY:  No results found.   ASSESSMENT AND PLAN:   82 year old male with past medical history of diabetes, hypertension, COPD, CHF, hyperlipidemia, history of previous CVA who presents to the hospital due to hypotension, shortness of breath and noted to be septic and also noted to have pneumonia.  1. Acute resp. Failure w/ hypoxia - due to Pneumonia/Aspiration on some food.  - cont. O2 supplementation, duonebs, pulm. Nebs.   -  check portable CXR Stat due to acute chocking episode today. If needed will consider adding IV steroids again.   2. Sepsis - due to pneumonia.  Pt. Was in the ICU initially on pressors and now weaned off. - was on Zosyn but now on Oral Augmentin and will cont.    -Afebrile, hemodynamically stable.  3. Pneumonia- suspect Aspiration.  Was on Zosyn and now Oral Augmentin.   4. COPD-mild acute exacerbation due to the pneumonia. -Continue duo nebs scheduled, Pulmicort nebs.  Had an acute choking episode.  Follow up CXR.  If needed consider IV steroids.    5. Diabetes type 2 without patient-continue Lantus, SSI and follow BS.  - appreciate diabetes coordinator input.   6. Dementia-continue Aricept. - cont. Haldol PRN for agitation.  7. Restless leg syndrome-continue Requip.  8. Hypokalemia-improved w/ supplementation.  - Mg. Level normal.   9. Thrombocytopenia - stable.  No bleeding will monitor.   Pt. Was going to be discharged to SNF today but developed some resp. Distress after choking and will hold off on discharge for now.     All the records are reviewed and case discussed with Care Management/Social Workerr. Management plans discussed with the patient, family and they are in agreement.  CODE STATUS: Full  DVT Prophylaxis: Lovenox  TOTAL TIME TAKING CARE OF THIS PATIENT: 30 minutes.   POSSIBLE D/C IN 1-2 DAYS, DEPENDING ON CLINICAL CONDITION.   Henreitta Leber M.D on 10/09/2015 at 1:59 PM  Between 7am to 6pm - Pager - [phone]  After 6pm go to [email or web address] - password [email or web address] [facility]  [place] [facility]  Office  [phone]  CC: Primary care physician; Otilio Miu, MD

## 2015-10-09 NOTE — Clinical Social Work Note (Signed)
CSW was able to contact [name] again via phone and explain the SNF benefits. She stated that patient would have to go home after day 20 because they could not afford the copayment. She stated she wanted patient to go to a local, [place], rehab facility. CSW informed her of the facilities that offered and the facilities that declined. The only facilities that offered were [facility] and [facility] and she was not fond of either. She wants [place] to check to see if they are in network with patient's policy. She stated that she will talk with patient and the rest of the family and make a decision by tomorrow. She is in agreement to have the covering weekend CSW contact her at : [phone]. CSW has been informed by both [facility] and [facility] that they are in network with patient's insurance. Shela Leff MSW,LCSW

## 2015-10-09 NOTE — Progress Notes (Signed)
Dr. Verdell Carmine notified that patient stated he choked on his chicken salad sandwich and water and possible aspirated. Orders for a chest xray ordered.

## 2015-10-09 NOTE — Progress Notes (Signed)
[name] for electrolyte monitoring and replacement   Allergies  Allergen Reactions  . Biaxin [Clarithromycin] Nausea Only   Patient Measurements: Height: 5\' 10"  (177.8 cm) Weight: 173 lb 14.4 oz (78.881 kg) IBW/kg (Calculated) : 73  Intake/Output from previous day: 04/27 0701 - 04/28 0700 In: 600 [P.O.:240; I.V.:210; IV Piggyback:150] Out: 825 [Urine:825] Intake/Output from this shift: Total I/O In: -  Out: 100 [Urine:100]  Labs:  Recent Labs  10/07/15 0430 10/08/15 0458 10/09/15 0502  WBC  --  6.4  --   HGB  --  11.9*  --   HCT  --  35.5*  --   PLT  --  96*  --   CREATININE 1.24 0.95 0.96  MG 2.3 2.3 2.3  PHOS  --   --  2.5   Estimated Creatinine Clearance: 60.2 mL/min (by C-G formula based on Cr of 0.96).   Recent Labs  10/09/15 1121 10/09/15 1707 10/09/15 1933  GLUCAP 187* 230* 199*   Assessment: Pharmacy consulted to monitor and replace electrolytes in this 82 year old male.   Goal of Therapy:  Electrolytes within normal limits  Plan:  Labs within normal limits.  No supplementation necessary. Will recheck AM labs in 2 days.   Thank you for allowing pharmacy to be part of this patient's care.  Laural Benes, Pharm.D., BCPS Clinical Pharmacist 10/09/2015,9:56 PM

## 2015-10-09 NOTE — Progress Notes (Signed)
Physical Therapy Treatment Patient Details Name: [name] Good MRN: [id number] DOB: [birth year] Today's Date: 10/09/2015    History of Present Illness 82 yo M presented to ED on 4/23 after graddaughter found him to be hypotensive. He was found to have septic shock. PMH includes DM, stroke, cancer, CHF, CAD.    PT Comments    Pt's HR increased from 86 bpm at rest to 126-127 bpm with activity (supine to sit and then with ambulation) requiring rest breaks for HR to return to 80's at rest.  Pt demonstrates impaired cardiopulmonary activity tolerance to functional mobility and does not appear safe to discharge home alone d/t this and assist levels noted below.  Continue to recommend pt discharge to STR (MD and nursing notified).   Follow Up Recommendations  SNF     Equipment Recommendations  None recommended by PT    Recommendations for Other Services       Precautions / Restrictions Precautions Precautions: Fall Restrictions Weight Bearing Restrictions: No    Mobility  Bed Mobility Overal bed mobility: Needs Assistance Bed Mobility: Supine to Sit;Sit to Supine     Supine to sit: Supervision;HOB elevated Sit to supine: Supervision;HOB elevated   General bed mobility comments: increased time, significant increased effort, and multiple tries required to sit up on edge of bed without assistance; required use of siderail to sit up  Transfers Overall transfer level: Needs assistance Equipment used: Rolling walker (2 wheeled) Transfers: Sit to/from Stand Sit to Stand: Min assist;Mod assist         General transfer comment: pt with posterior lean (with B LE's bracing against bed) requiring assist to shift weight forward to steady upon standing; vc's required for hand placement on walker  Ambulation/Gait Ambulation/Gait assistance: Min assist Ambulation Distance (Feet): 30 Feet Assistive device: Rolling walker (2 wheeled)   Gait velocity: decreased   General Gait  Details: decreased step length B; decreased B heelstrike; narrow BOS; limited distance d/t HR increase   Stairs            Wheelchair Mobility    Modified Rankin (Stroke Patients Only)       Balance Overall balance assessment: Needs assistance Sitting-balance support: Bilateral upper extremity supported;Feet supported Sitting balance-Leahy Scale: Good     Standing balance support: Bilateral upper extremity supported (RW) Standing balance-Leahy Scale: Fair                      Cognition Arousal/Alertness: Awake/alert Behavior During Therapy: WFL for tasks assessed/performed Overall Cognitive Status: Within Functional Limits for tasks assessed                      Exercises      General Comments   Nursing cleared pt for participation in physical therapy.  Pt agreeable to PT session. Pt required rest breaks to decrease HR.  Pt educated on PT's recommendations for STR: pt verbalizing good understanding.      Pertinent Vitals/Pain Pain Assessment: No/denies pain    Home Living                      Prior Function            PT Goals (current goals can now be found in the care plan section) Acute Rehab PT Goals Patient Stated Goal: to get better PT Goal Formulation: With patient/family Time For Goal Achievement: 10/21/15 Potential to Achieve Goals: Fair Progress towards PT goals: Progressing toward goals  Frequency  Min 2X/week    PT Plan Current plan remains appropriate    Co-evaluation             End of Session Equipment Utilized During Treatment: Gait belt Activity Tolerance:  (Limited d/t significant HR increase with limited activity) Patient left: in bed;with call bell/phone within reach;with bed alarm set     Time: 1110-1148 PT Time Calculation (min) (ACUTE ONLY): 38 min  Charges:  $Gait Training: 8-22 mins $Therapeutic Activity: 23-37 mins                    G CodesLeitha Bleak Oct 22, 2015, 2:05  PM Leitha Bleak, [place]

## 2015-10-09 NOTE — Clinical Social Work Note (Signed)
CSW attempted to follow up with [name] Good at number provided on patient's facesheet however there was no answer and no answering machine that picked up. Patient has had two bed offers: [facility] and [facility]. Shela Leff MSW,LCSW [phone]

## 2015-10-09 NOTE — Clinical Social Work Note (Signed)
CSW received call from [place] at [facility] and he stated that patient's granddaughter, [name], called them and after they realized who the family was, they were going to change their disposition and make a bed offer now. They are in network and [facility] verified that there would be no copayment for days 1-20. CSW contacted [name] to inform her of this and she stated that she would go ahead and accept their bed offer with plans for him to transfer there at discharge. [name] was made aware that it could happen over the weekend and so was Bill at [place] and both were in agreement. Shela Leff MSW,LCSW [phone]

## 2015-10-09 NOTE — Progress Notes (Signed)
Pt complains of SOB, oxygen sats in 85% per 3L [place], lungs show exp wheeze to ascultation. Breathing treatment started early. I will continue to assess.

## 2015-10-09 NOTE — Care Management (Addendum)
Patient was not able to ambulate with physical therapy  and there is continued recommendation for skilled nursing.  Contacted [facility] and informed her that patient requires max 2+ assist to stand and could only take a few steps.  Discussed the need for skilled nursing if there is not adequate caregiver support in the home to address the functional aspect of care.  She agrees to skilled nursing placement and gives permission for bed search.  Patient is in agreement with plan. [name] has concerns regarding copays and informed that CSW would investigate any copay issues.  Meanwhile it was reported to CM that patient's 02 sats dropped to 85% on 3 liters and experiencing wheezing.  Then reported that.patient choked on his sandwhich and there is concern for aspiration.  Discharge will be cancelled for today.

## 2015-10-10 LAB — CBC
HCT: 33.2 % — ABNORMAL LOW (ref 40.0–52.0)
HEMOGLOBIN: 11 g/dL — AB (ref 13.0–18.0)
MCH: 30.5 pg (ref 26.0–34.0)
MCHC: 33.1 g/dL (ref 32.0–36.0)
MCV: 91.9 fL (ref 80.0–100.0)
Platelets: 107 10*3/uL — ABNORMAL LOW (ref 150–440)
RBC: 3.61 MIL/uL — ABNORMAL LOW (ref 4.40–5.90)
RDW: 15.1 % — ABNORMAL HIGH (ref 11.5–14.5)
WBC: 9 10*3/uL (ref 3.8–10.6)

## 2015-10-10 LAB — GLUCOSE, CAPILLARY
GLUCOSE-CAPILLARY: 49 mg/dL — AB (ref 65–99)
GLUCOSE-CAPILLARY: 161 mg/dL — AB (ref 65–99)
GLUCOSE-CAPILLARY: 214 mg/dL — AB (ref 65–99)
Glucose-Capillary: 110 mg/dL — ABNORMAL HIGH (ref 65–99)
Glucose-Capillary: 48 mg/dL — ABNORMAL LOW (ref 65–99)
Glucose-Capillary: 266 mg/dL — ABNORMAL HIGH (ref 65–99)

## 2015-10-10 LAB — BASIC METABOLIC PANEL
ANION GAP: 8 (ref 5–15)
BUN: 12 mg/dL (ref 6–20)
CALCIUM: 8.8 mg/dL — AB (ref 8.9–10.3)
CHLORIDE: 112 mmol/L — AB (ref 101–111)
CO2: 25 mmol/L (ref 22–32)
CREATININE: 0.96 mg/dL (ref 0.61–1.24)
GFR calc non Af Amer: >60 mL/min (ref >60)
Glucose, Bld: 48 mg/dL — ABNORMAL LOW (ref 65–99)
Potassium: 3.6 mmol/L (ref 3.5–5.1)
SODIUM: 145 mmol/L (ref 135–145)

## 2015-10-10 MED ORDER — PRAMIPEXOLE DIHYDROCHLORIDE 1 MG PO TABS
1.5000 mg | ORAL_TABLET | Freq: Every day | ORAL | Status: DC
  Administered 2015-10-10: 1.5 mg via ORAL
  Filled 2015-10-10: qty 1

## 2015-10-10 MED ORDER — FUROSEMIDE 10 MG/ML IJ SOLN
80.0000 mg | Freq: Once | INTRAMUSCULAR | Status: AC
  Administered 2015-10-10: 80 mg via INTRAVENOUS
  Filled 2015-10-10: qty 8

## 2015-10-10 MED ORDER — INSULIN GLARGINE 100 UNIT/ML ~~LOC~~ SOLN
15.0000 [IU] | Freq: Every morning | SUBCUTANEOUS | Status: DC
  Administered 2015-10-10: 15 [IU] via SUBCUTANEOUS
  Filled 2015-10-10 (×2): qty 0.15

## 2015-10-10 MED ORDER — FUROSEMIDE 40 MG PO TABS
80.0000 mg | ORAL_TABLET | Freq: Two times a day (BID) | ORAL | Status: DC
  Administered 2015-10-10 – 2015-10-11 (×2): 80 mg via ORAL
  Filled 2015-10-10 (×2): qty 2

## 2015-10-10 MED ORDER — PREDNISONE 20 MG PO TABS
20.0000 mg | ORAL_TABLET | Freq: Every day | ORAL | Status: DC
  Administered 2015-10-10 – 2015-10-11 (×2): 20 mg via ORAL
  Filled 2015-10-10 (×2): qty 1

## 2015-10-10 NOTE — Progress Notes (Signed)
FSBS 49, pt asymptomatic, OJ and crackers with peanut butter given, will recheck.

## 2015-10-10 NOTE — Progress Notes (Signed)
FSBS 110, daughter at bedside, denies need.

## 2015-10-10 NOTE — Progress Notes (Signed)
Patient ID: [name] Good, male   DOB: May 19, 1931, 82 y.o.   MRN: [id number] [facility] PROGRESS NOTE  [name] Good [id number] DOB: 09-19-1930 DOA: 10/04/2015 PCP: Otilio Miu, MD  HPI/Subjective: Patient had a choking episode last night. Family states that he's been so short of breath lately with the decreased dose of Lasix that they think that's the problem with the choking. Patient is breathing better today than he was yesterday. Family concerned that his restless leg medication was changed when he came in and that he wasn't taking the Cardura. Patient states his appetite is good. Patient also had a low sugar this morning.  Objective: Filed Vitals:   10/10/15 0408 10/10/15 0754  BP: 131/63 114/66  Pulse: 87 80  Temp: 98.7 F (37.1 C) 97.9 F (36.6 C)  Resp: 20 18    Filed Weights   10/08/15 0437 10/09/15 0432 10/10/15 0408  Weight: 81.602 kg (179 lb 14.4 oz) 78.881 kg (173 lb 14.4 oz) 80.06 kg (176 lb 8 oz)    ROS: Review of Systems  Constitutional: Negative for fever and chills.  Eyes: Negative for blurred vision.  Respiratory: Positive for cough, shortness of breath and wheezing.   Cardiovascular: Negative for chest pain.  Gastrointestinal: Negative for nausea, vomiting, abdominal pain, diarrhea and constipation.  Genitourinary: Negative for dysuria.  Musculoskeletal: Negative for joint pain.  Neurological: Negative for dizziness and headaches.   Exam: Physical Exam  Constitutional: He is oriented to person, place, and time.  HENT:  Nose: No mucosal edema.  Mouth/Throat: No oropharyngeal exudate or posterior oropharyngeal edema.  Eyes: Conjunctivae, EOM and lids are normal. Pupils are equal, round, and reactive to light.  Neck: No JVD present. Carotid bruit is not present. No edema present. No thyroid mass and no thyromegaly present.  Cardiovascular: S1 normal and S2 normal.  Exam reveals no gallop.   No murmur heard. Pulses:      Dorsalis pedis  pulses are 2+ on the right side, and 2+ on the left side.  Respiratory: Accessory muscle usage present. No respiratory distress. He has wheezes in the right middle field, the right lower field, the left middle field and the left lower field. He has no rhonchi. He has no rales.  GI: Soft. Bowel sounds are normal. He exhibits distension. There is no tenderness.  Musculoskeletal:       Right ankle: He exhibits swelling.       Left ankle: He exhibits swelling.  Lymphadenopathy:    He has no cervical adenopathy.  Neurological: He is alert and oriented to person, place, and time. No cranial nerve deficit.  Skin: Skin is warm. No rash noted. Nails show no clubbing.  Psychiatric: He has a normal mood and affect.      Data Reviewed: Basic Metabolic Panel:  Recent Labs Lab 10/05/15 0312 10/06/15 0511 10/07/15 0430 10/07/15 1711 10/08/15 0458 10/09/15 0502 10/10/15 0443  NA 138 138 141  --  146* 144 145  K 3.3* 3.5 3.1* 4.2 3.9 3.8 3.6  CL 106 108 113*  --  115* 114* 112*  CO2 19* 23 24  --  24 23 25   GLUCOSE 137* 108* 100*  --  86 102* 48*  BUN 60* 53* 30*  --  15 13 12   CREATININE 2.16* 1.88* 1.24  --  0.95 0.96 0.96  CALCIUM 7.2* 8.3* 8.4*  --  9.1 9.1 8.8*  MG 1.5* 2.2 2.3  --  2.3 2.3  --   PHOS 3.6  --   --   --   --  2.5  --    Liver Function Tests:  Recent Labs Lab 10/04/15 2210  AST 52*  ALT 16*  ALKPHOS 44  BILITOT 1.1  PROT 5.3*  ALBUMIN 3.1*   CBC:  Recent Labs Lab 10/04/15 2118 10/05/15 0312 10/06/15 0511 10/08/15 0458 10/10/15 0443  WBC 10.6 7.2 6.9 6.4 9.0  NEUTROABS 9.4*  --   --   --   --   HGB 12.3* 10.7* 11.1* 11.9* 11.0*  HCT 36.3* 31.6* 32.6* 35.5* 33.2*  MCV 91.6 89.9 90.1 89.9 91.9  PLT 105* 85* 95* 96* 107*   Cardiac Enzymes:  Recent Labs Lab 10/04/15 2210 10/05/15 0312 10/05/15 1014  TROPONINI 0.21* 0.13* 0.13*   BNP (last 3 results)  Recent Labs  10/10/14 1036  BNP 477*     CBG:  Recent Labs Lab 10/09/15 1707  10/09/15 1933 10/10/15 0730 10/10/15 0752 10/10/15 0827  GLUCAP 230* 199* 49* 48* 110*    Recent Results (from the past 240 hour(s))  Blood Culture (routine x 2)     Status: None   Collection Time: 10/04/15  9:18 PM  Result Value Ref Range Status   Specimen Description BLOOD RIGHT HAND  Final   Special Requests   Final    BOTTLES DRAWN AEROBIC AND ANAEROBIC 5CCAERO,5CCAERO   Culture NO GROWTH 5 DAYS  Final   Report Status 10/09/2015 FINAL  Final  Blood Culture (routine x 2)     Status: None   Collection Time: 10/04/15  9:30 PM  Result Value Ref Range Status   Specimen Description BLOOD LEFT UPPER ARM  Final   Special Requests BOTTLES DRAWN AEROBIC AND ANAEROBIC [place]  Final   Culture NO GROWTH 5 DAYS  Final   Report Status 10/09/2015 FINAL  Final  MRSA PCR Screening     Status: None   Collection Time: 10/05/15  3:12 AM  Result Value Ref Range Status   MRSA by PCR NEGATIVE NEGATIVE Final    Comment:        The GeneXpert MRSA Assay (FDA approved for NASAL specimens only), is one component of a comprehensive MRSA colonization surveillance program. It is not intended to diagnose MRSA infection nor to guide or monitor treatment for MRSA infections.   Urine culture     Status: None   Collection Time: 10/06/15 11:06 PM  Result Value Ref Range Status   Specimen Description URINE, CLEAN CATCH  Final   Special Requests NONE  Final   Culture NO GROWTH 1 DAY  Final   Report Status 10/08/2015 FINAL  Final     Studies: Dg Chest Port 1 View  10/09/2015  CLINICAL DATA:  Aspiration EXAM: PORTABLE CHEST 1 VIEW COMPARISON:  10/06/2015 FINDINGS: The right jugular central line is been removed. Cardiac shadow is mildly enlarged but stable. The lungs are well aerated. A small right-sided pleural effusion is seen. No focal infiltrate is noted. IMPRESSION: Small right pleural effusion.  No definitive infiltrate is seen. Electronically Signed   By: Inez Catalina M.D.   On: 10/09/2015  14:17    Scheduled Meds: . amoxicillin-clavulanate  1 tablet Oral Q12H  . aspirin EC  325 mg Oral Daily  . atorvastatin  10 mg Oral Daily  . budesonide (PULMICORT) nebulizer solution  0.25 mg Nebulization BID  . diltiazem  180 mg Oral Daily  . donepezil  10 mg Oral QHS  . doxazosin  4 mg Oral Daily  . enoxaparin (LOVENOX) injection  40 mg Subcutaneous Q24H  .  feeding supplement (GLUCERNA SHAKE)  237 mL Oral BID BM  . furosemide  80 mg Intravenous Once  . furosemide  80 mg Oral BID  . insulin aspart  0-15 Units Subcutaneous TID WC  . insulin aspart  0-5 Units Subcutaneous QHS  . insulin glargine  15 Units Subcutaneous q morning - 10a  . ipratropium-albuterol  3 mL Nebulization Q6H  . pramipexole  1.5 mg Oral QHS  . rOPINIRole  2 mg Oral QHS    Assessment/Plan:  1. Acute respiratory failure with hypoxia. Yesterday had an aspiration episode. Today still using accessory muscles to breathe. Currently on 3 L of oxygen. Patient does not wear oxygen at home. Family concerned that he is on lower dose of Lasix. I will give 1 dose of IV Lasix. Continue nebulizer treatments for wheeze. Also gives an oral steroid. 2. Sepsis secondary to pneumonia. Was initially in the ICU. Now on oral Augmentin 3. Pneumonia likely aspiration now on oral Augmentin 4. COPD exacerbation. Continue steroid nebulizer and nebulizer treatments. Start oral prednisone 5. Type 2 diabetes with hypoglycemia this morning. May be secondary to not eating much last night after this choking episode. Decreased dose of Lantus down to 15 units. Steroids will also help sugars come up. 6. Dementia on Aricept 7. Restless leg syndrome on Requip and Mirapex. Family was concerned that the dosages of these medications were split up on admission and he takes both of them at night. 8. Thrombocytopenia stable 9. Weakness . Reevaluate daily to see if we are able to get him out to the rehabilitation facility  Code Status:     Code Status  Orders        Start     Ordered   10/05/15 0004  Full code   Continuous     10/05/15 0008    Code Status History    Date Active Date Inactive Code Status Order ID Comments User Context   This patient has a current code status but no historical code status.    Advance Directive Documentation        Most Recent Value   Type of Advance Directive  Healthcare Power of Attorney   Pre-existing out of facility DNR order (yellow form or pink MOST form)     "MOST" Form in Place?       Family Communication: Granddaughter at bedside Disposition Plan: out to rehabilitation in the next day or so depending on clinical course.   Antibiotics:  Augmentin  Time spent: 37minutes  Loletha Grayer  [employer]

## 2015-10-10 NOTE — Plan of Care (Signed)
Problem: Physical Regulation: Goal: Signs and symptoms of infection will decrease Outcome: Progressing Po antibiotics  Problem: Respiratory: Goal: Ability to maintain adequate ventilation will improve Outcome: Progressing Weaning O2   Problem: Safety: Goal: Ability to remain free from injury will improve Outcome: Progressing Fall precautions in place  Problem: Tissue Perfusion: Goal: Risk factors for ineffective tissue perfusion will decrease Outcome: Progressing SQ Lovenox

## 2015-10-10 NOTE — Progress Notes (Signed)
Pt was having a hard time breathing earlier on shift, respirations were labored in the 25-27, bilateral wheezing, patient is on Lasix at home, Dr. Jannifer Franklin was requested to revised home order for Lasix. Per Dr. Jannifer Franklin, he will reorder Pt's Lasix and an order for a one time dose of Lasix was given and administered on this shift. Pt's respirations have improve. Will continue to monitor.

## 2015-10-11 DIAGNOSIS — J441 Chronic obstructive pulmonary disease with (acute) exacerbation: Secondary | ICD-10-CM | POA: Diagnosis not present

## 2015-10-11 DIAGNOSIS — J189 Pneumonia, unspecified organism: Secondary | ICD-10-CM | POA: Diagnosis not present

## 2015-10-11 DIAGNOSIS — G2581 Restless legs syndrome: Secondary | ICD-10-CM | POA: Diagnosis not present

## 2015-10-11 DIAGNOSIS — N179 Acute kidney failure, unspecified: Secondary | ICD-10-CM | POA: Diagnosis not present

## 2015-10-11 DIAGNOSIS — R279 Unspecified lack of coordination: Secondary | ICD-10-CM | POA: Diagnosis not present

## 2015-10-11 DIAGNOSIS — R262 Difficulty in walking, not elsewhere classified: Secondary | ICD-10-CM | POA: Diagnosis not present

## 2015-10-11 DIAGNOSIS — J690 Pneumonitis due to inhalation of food and vomit: Secondary | ICD-10-CM | POA: Diagnosis not present

## 2015-10-11 DIAGNOSIS — A419 Sepsis, unspecified organism: Secondary | ICD-10-CM | POA: Diagnosis not present

## 2015-10-11 DIAGNOSIS — J9601 Acute respiratory failure with hypoxia: Secondary | ICD-10-CM | POA: Diagnosis not present

## 2015-10-11 DIAGNOSIS — R1312 Dysphagia, oropharyngeal phase: Secondary | ICD-10-CM | POA: Diagnosis not present

## 2015-10-11 DIAGNOSIS — M6281 Muscle weakness (generalized): Secondary | ICD-10-CM | POA: Diagnosis not present

## 2015-10-11 LAB — PHOSPHORUS: Phosphorus: 3.3 mg/dL (ref 2.5–4.6)

## 2015-10-11 LAB — BASIC METABOLIC PANEL
ANION GAP: 8 (ref 5–15)
BUN: 21 mg/dL — ABNORMAL HIGH (ref 6–20)
CALCIUM: 8.5 mg/dL — AB (ref 8.9–10.3)
CO2: 27 mmol/L (ref 22–32)
CREATININE: 1.17 mg/dL (ref 0.61–1.24)
Chloride: 104 mmol/L (ref 101–111)
GFR calc Af Amer: >60 mL/min (ref >60)
GFR, EST NON AFRICAN AMERICAN: 56 mL/min — AB (ref >60)
GLUCOSE: 326 mg/dL — AB (ref 65–99)
Potassium: 4.4 mmol/L (ref 3.5–5.1)
Sodium: 139 mmol/L (ref 135–145)

## 2015-10-11 LAB — GLUCOSE, CAPILLARY: GLUCOSE-CAPILLARY: 339 mg/dL — AB (ref 65–99)

## 2015-10-11 LAB — MAGNESIUM: Magnesium: 2.1 mg/dL (ref 1.7–2.4)

## 2015-10-11 MED ORDER — BUDESONIDE 0.25 MG/2ML IN SUSP: 0.2500 mg | Freq: Two times a day (BID) | RESPIRATORY_TRACT | Status: DC

## 2015-10-11 MED ORDER — PREDNISOLONE SODIUM PHOSPHATE 15 MG/5ML PO SOLN
10.0000 mg | Freq: Every day | ORAL | Status: DC
  Filled 2015-10-11 (×2): qty 5

## 2015-10-11 MED ORDER — PRAMIPEXOLE DIHYDROCHLORIDE 1 MG PO TABS: 1.5000 mg | ORAL_TABLET | Freq: Every day | ORAL | Status: AC

## 2015-10-11 MED ORDER — IPRATROPIUM-ALBUTEROL 0.5-2.5 (3) MG/3ML IN SOLN: 3.0000 mL | Freq: Four times a day (QID) | RESPIRATORY_TRACT | Status: DC

## 2015-10-11 MED ORDER — PREDNISONE 10 MG PO TABS: ORAL_TABLET | ORAL | Status: DC

## 2015-10-11 MED ORDER — PREDNISOLONE 5 MG PO TABS
10.0000 mg | ORAL_TABLET | Freq: Every day | ORAL | Status: DC
  Filled 2015-10-11: qty 2

## 2015-10-11 MED ORDER — AMOXICILLIN-POT CLAVULANATE 875-125 MG PO TABS: 1.0000 | ORAL_TABLET | Freq: Two times a day (BID) | ORAL | Status: AC

## 2015-10-11 MED ORDER — INSULIN GLARGINE 100 UNIT/ML ~~LOC~~ SOLN
20.0000 [IU] | Freq: Every morning | SUBCUTANEOUS | Status: DC
  Administered 2015-10-11: 20 [IU] via SUBCUTANEOUS
  Filled 2015-10-11: qty 0.2

## 2015-10-11 NOTE — Plan of Care (Signed)
Problem: Skin Integrity: Goal: Risk for impaired skin integrity will decrease Outcome: Progressing Sacral foam in place and turns frequently.

## 2015-10-11 NOTE — Discharge Summary (Signed)
[facility] [place] NAME: [name] Good    MR#:  [id number]  DATE OF BIRTH:  01-05-1931  DATE OF ADMISSION:  10/04/2015 ADMITTING PHYSICIAN: Pradeep Ramschandran  DATE OF DISCHARGE: 10/11/2015  PRIMARY CARE PHYSICIAN: Otilio Miu, MD    ADMISSION DIAGNOSIS:  Sepsis, due to unspecified organism ([place]) [A41.9] Acute renal failure, unspecified acute renal failure type ([place]) [N17.9] Aspiration pneumonia of right lower lobe, unspecified aspiration pneumonia type ([place]) [J69.0]  DISCHARGE DIAGNOSIS:  Active Problems:   Septic shock ([place])   Aspiration pneumonia of right lower lobe ([facility])   Acute on chronic respiratory failure with hypoxia and hypercapnia ([place])   SECONDARY DIAGNOSIS:   Past Medical History  Diagnosis Date  . Diabetes mellitus without complication ([place])   . Hyperlipidemia   . Hypertension   . Stroke ([place])   . Arthritis   . Cancer ([place])   . CHF (congestive heart failure) ([place])   . Venous insufficiency   . CAD (coronary artery disease)     HOSPITAL COURSE:   1. Acute respiratory failure with hypoxia. Patient initially was admitted by the critical care specialist to the ICU and required oxygen supplementation. This had improved and was transferred over to the medical service. On the evening of 10/09/2015, he developed a choking episode and then started wheezing and was placed back on oxygen for hypoxia. He was given steroids nebulizer treatments and Lasix and is now off oxygen completely. 2. Clinical sepsis secondary to pneumonia was in the ICU initially. Now on oral Augmentin 3. Pneumonia, believed secondary to aspiration. Now on oral Augmentin 4. COPD exacerbation continue steroid nebulizer and nebulizer treatments start oral prednisone 5. Type 2 diabetes. Had hypoglycemia yesterday. With restarting steroids sugars are up. Continue Lantus 20 units and sliding scale. Can restart Glucophage as outpatient 6. Dementia on  Aricept 7. Restless leg syndrome on Requip and Mirapex. Patient takes both these medications and night 8. Thrombocytopenia stable 9. Weakness. We'll go to rehabilitation today 10. Atrial fibrillation. Heart rate does go down in the nighttime when he sleeping. His Cardizem CD was decreased down to once a day dosing. Aspirin for anticoagulation. 11. History of heart failure diastolic in nature. Patient takes a large dose of Lasix. I recommend checking a BMP on a weekly basis while in rehabilitation  12. Elevated troponin demand ischemia from acute respiratory failure 13. Patient had PVCs 5 in a row with nebulizer treatment.  DISCHARGE CONDITIONS:   Satisfactory  CONSULTS OBTAINED:  Critical care specialist  DRUG ALLERGIES:   Allergies  Allergen Reactions  . Biaxin [Clarithromycin] Nausea Only    DISCHARGE MEDICATIONS:   Current Discharge Medication List    START taking these medications   Details  amoxicillin-clavulanate (AUGMENTIN) 875-125 MG tablet Take 1 tablet by mouth every 12 (twelve) hours. Qty: 10 tablet, Refills: 0    budesonide (PULMICORT) 0.25 MG/2ML nebulizer solution Take 2 mLs (0.25 mg total) by nebulization 2 (two) times daily. Qty: 60 mL, Refills: 12    diltiazem (CARDIZEM CD) 180 MG 24 hr capsule Take 1 capsule (180 mg total) by mouth daily.    ipratropium-albuterol (DUONEB) 0.5-2.5 (3) MG/3ML SOLN Take 3 mLs by nebulization every 6 (six) hours. Qty: 360 mL, Refills: 0    predniSONE (DELTASONE) 10 MG tablet Take 2 tabs po daily day1,2; 1 tab daily day3,4; 1/2 tab daily day5,6 Qty: 7 tablet, Refills: 0      CONTINUE these medications which have CHANGED   Details  pramipexole (MIRAPEX) 1 MG tablet Take 1.5 tablets (1.5 mg total) by mouth at bedtime. Dr Roberts Gaudy these medications which have NOT CHANGED   Details  aspirin EC 325 MG tablet Take 325 mg by mouth daily.    atorvastatin (LIPITOR) 20 MG tablet Take 10 mg by mouth daily.     docusate sodium (COLACE) 100 MG capsule Take 100 mg by mouth daily.    donepezil (ARICEPT) 10 MG tablet Take 10 mg by mouth at bedtime.    doxazosin (CARDURA) 4 MG tablet Take 4 mg by mouth at bedtime.     furosemide (LASIX) 80 MG tablet Take 80-160 mg by mouth 2 (two) times daily. Take 80mg  twice daily on Sunday, Monday, Wednesday, Friday, and Saturday. Take 160mg  twice daily on Tuesday and Thursday.    insulin glargine (LANTUS) 100 UNIT/ML injection Inject 20 Units into the skin every morning. Dr Enzo Montgomery    insulin regular (NOVOLIN R,HUMULIN R) 100 units/mL injection Inject 0-12 Units into the skin 3 (three) times daily before meals. Sliding scale- Dr Enzo Montgomery    losartan (COZAAR) 50 MG tablet Take 50 mg by mouth daily.    Melatonin 3 MG TABS Take 3 mg by mouth at bedtime.    meloxicam (MOBIC) 15 MG tablet Take 7.5 mg by mouth daily.    metFORMIN (GLUCOPHAGE) 500 MG tablet Take 500 mg by mouth 2 (two) times daily with a meal.    rOPINIRole (REQUIP) 2 MG tablet Take 2 mg by mouth at bedtime.    spironolactone (ALDACTONE) 25 MG tablet Take 25 mg by mouth daily.       STOP taking these medications     diltiazem (TIAZAC) 180 MG 24 hr capsule          DISCHARGE INSTRUCTIONS:   Satisfactory  If you experience worsening of your admission symptoms, develop shortness of breath, life threatening emergency, suicidal or homicidal thoughts you must seek medical attention immediately by calling 911 or calling your MD immediately  if symptoms less severe.  You Must read complete instructions/literature along with all the possible adverse reactions/side effects for all the Medicines you take and that have been prescribed to you. Take any new Medicines after you have completely understood and accept all the possible adverse reactions/side effects.   Please note  You were cared for by a hospitalist during your hospital stay. If you have any questions about your discharge medications or the  care you received while you were in the hospital after you are discharged, you can call the unit and asked to speak with the hospitalist on call if the hospitalist that took care of you is not available. Once you are discharged, your primary care physician will handle any further medical issues. Please note that NO REFILLS for any discharge medications will be authorized once you are discharged, as it is imperative that you return to your primary care physician (or establish a relationship with a primary care physician if you do not have one) for your aftercare needs so that they can reassess your need for medications and monitor your lab values.    Today   CHIEF COMPLAINT:   Chief Complaint  Patient presents with  . Hypotension    HISTORY OF PRESENT ILLNESS:  [name] Good  is a 82 y.o. male with a known history of Was brought in secondary hypotension found to be in clinical sepsis with pneumonia and acute respiratory failure   VITAL SIGNS:  Blood  pressure 146/72, pulse 80, temperature 98.2 F (36.8 C), temperature source Oral, resp. rate 18, height 5\' 10"  (1.778 m), weight 79.017 kg (174 lb 3.2 oz), SpO2 94 %.    PHYSICAL EXAMINATION:  GENERAL:  82 y.o.-year-old patient lying in the bed with no acute distress.  EYES: Pupils equal, round, reactive to light and accommodation. No scleral icterus. Extraocular muscles intact.  HEENT: Head atraumatic, normocephalic. Oropharynx and nasopharynx clear.  NECK:  Supple, no jugular venous distention. No thyroid enlargement, no tenderness.  LUNGS: Decreased breath sounds bilaterally, slight expiratory wheezing, no rales,rhonchi or crepitation. No use of accessory muscles of respiration.  CARDIOVASCULAR: S1, S2 irregularly regular. 3/6 systolic murmurs, no rubs, or gallops.  ABDOMEN: Soft, non-tender, non-distended. Bowel sounds present. No organomegaly or mass.  EXTREMITIES: 2+ pedal edema, no cyanosis, or clubbing.  NEUROLOGIC: Cranial nerves II  through XII are intact. Muscle strength 5/5 in all extremities. Sensation intact. Gait not checked.  PSYCHIATRIC: The patient is alert and oriented x 3.  SKIN: No obvious rash, lesion, or ulcer.   DATA REVIEW:   CBC  Recent Labs Lab 10/10/15 0443  WBC 9.0  HGB 11.0*  HCT 33.2*  PLT 107*    Chemistries   Recent Labs Lab 10/04/15 2210  10/11/15 0516  NA 135  < > 139  K 4.5  < > 4.4  CL 100*  < > 104  CO2 23  < > 27  GLUCOSE 121*  < > 326*  BUN 66*  < > 21*  CREATININE 2.66*  < > 1.17  CALCIUM 7.9*  < > 8.5*  MG  --   < > 2.1  AST 52*  --   --   ALT 16*  --   --   ALKPHOS 44  --   --   BILITOT 1.1  --   --   < > = values in this interval not displayed.  Cardiac Enzymes  Recent Labs Lab 10/05/15 1014  TROPONINI 0.13*    Microbiology Results  Results for orders placed or performed during the hospital encounter of 10/04/15  Blood Culture (routine x 2)     Status: None   Collection Time: 10/04/15  9:18 PM  Result Value Ref Range Status   Specimen Description BLOOD RIGHT HAND  Final   Special Requests   Final    BOTTLES DRAWN AEROBIC AND ANAEROBIC 5CCAERO,5CCAERO   Culture NO GROWTH 5 DAYS  Final   Report Status 10/09/2015 FINAL  Final  Blood Culture (routine x 2)     Status: None   Collection Time: 10/04/15  9:30 PM  Result Value Ref Range Status   Specimen Description BLOOD LEFT UPPER ARM  Final   Special Requests BOTTLES DRAWN AEROBIC AND ANAEROBIC [place]  Final   Culture NO GROWTH 5 DAYS  Final   Report Status 10/09/2015 FINAL  Final  MRSA PCR Screening     Status: None   Collection Time: 10/05/15  3:12 AM  Result Value Ref Range Status   MRSA by PCR NEGATIVE NEGATIVE Final    Comment:        The GeneXpert MRSA Assay (FDA approved for NASAL specimens only), is one component of a comprehensive MRSA colonization surveillance program. It is not intended to diagnose MRSA infection nor to guide or monitor treatment for MRSA infections.    Urine culture     Status: None   Collection Time: 10/06/15 11:06 PM  Result Value Ref Range Status   Specimen  Description URINE, CLEAN CATCH  Final   Special Requests NONE  Final   Culture NO GROWTH 1 DAY  Final   Report Status 10/08/2015 FINAL  Final    RADIOLOGY:  Dg Chest Port 1 View  10/09/2015  CLINICAL DATA:  Aspiration EXAM: PORTABLE CHEST 1 VIEW COMPARISON:  10/06/2015 FINDINGS: The right jugular central line is been removed. Cardiac shadow is mildly enlarged but stable. The lungs are well aerated. A small right-sided pleural effusion is seen. No focal infiltrate is noted. IMPRESSION: Small right pleural effusion.  No definitive infiltrate is seen. Electronically Signed   By: Inez Catalina M.D.   On: 10/09/2015 14:17         Management plans discussed with the patient, family and they are in agreement.  CODE STATUS:     Code Status Orders        Start     Ordered   10/05/15 0004  Full code   Continuous     10/05/15 0008    Code Status History    Date Active Date Inactive Code Status Order ID Comments User Context   This patient has a current code status but no historical code status.    Advance Directive Documentation        Most Recent Value   Type of Advance Directive  Healthcare Power of Attorney   Pre-existing out of facility DNR order (yellow form or pink MOST form)     "MOST" Form in Place?        TOTAL TIME TAKING CARE OF THIS PATIENT: 35 minutes.    Loletha Grayer M.D on 10/11/2015 at 9:00 AM  Between 7am to 6pm - Pager - [phone]  After 6pm go to [email or web address] - password [email or web address] [facility]  [phone]  CC: Primary care physician; Otilio Miu, MD

## 2015-10-11 NOTE — Progress Notes (Signed)
DC via EMS to [facility]

## 2015-10-11 NOTE — Progress Notes (Signed)
[place] for electrolyte monitoring and replacement   Allergies  Allergen Reactions  . Biaxin [Clarithromycin] Nausea Only   Patient Measurements: Height: 5\' 10"  (177.8 cm) Weight: 174 lb 3.2 oz (79.017 kg) IBW/kg (Calculated) : 73  Intake/Output from previous day: 04/29 0701 - 04/30 0700 In: 720 [P.O.:720] Out: 2975 [Urine:2975] Intake/Output from this shift: Total I/O In: -  Out: 100 [Urine:100]  Labs:  Recent Labs  10/09/15 0502 10/10/15 0443 10/11/15 0516  WBC  --  9.0  --   HGB  --  11.0*  --   HCT  --  33.2*  --   PLT  --  107*  --   CREATININE 0.96 0.96 1.17  MG 2.3  --  2.1  PHOS 2.5  --  3.3   Estimated Creatinine Clearance: 49.4 mL/min (by C-G formula based on Cr of 1.17).   Recent Labs  10/10/15 1655 10/10/15 2145 10/11/15 0726  GLUCAP 214* 266* 339*   Assessment: Pharmacy consulted to monitor and replace electrolytes in this 82 year old male.   Goal of Therapy:  Electrolytes within normal limits  Plan:  Labs within normal limits.  No supplementation necessary. Will recheck AM labs in 2 days.   Thank you for allowing pharmacy to be part of this patient's care.  [place],Keziah Drotar D, Pharm.D., BCPS Clinical Pharmacist 10/11/2015,8:03 AM

## 2015-10-11 NOTE — Clinical Social Work Placement (Signed)
   CLINICAL SOCIAL WORK PLACEMENT  NOTE  Date:  10/11/2015  Patient Details  Name: [name] Good MRN: [id number] Date of Birth: 1931-03-22  Clinical Social Work is seeking post-discharge placement for this patient at the [place] level of care (*CSW will initial, date and re-position this form in  chart as items are completed):  Yes   Patient/family provided with [facility] Work Department's list of facilities offering this level of care within the geographic area requested by the patient (or if unable, by the patient's family).  Yes   Patient/family informed of their freedom to choose among providers that offer the needed level of care, that participate in Medicare, Medicaid or managed care program needed by the patient, have an available bed and are willing to accept the patient.  Yes   Patient/family informed of [facility]'s ownership interest in [facility] and [facility], as well as of the fact that they are under no obligation to receive care at these facilities.  PASRR submitted to EDS on 10/09/15     PASRR number received on 10/09/15     Existing PASRR number confirmed on       FL2 transmitted to all facilities in geographic area requested by pt/family on 10/09/15     FL2 transmitted to all facilities within larger geographic area on       Patient informed that his/her managed care company has contracts with or will negotiate with certain facilities, including the following:        Yes   Patient/family informed of bed offers received.  Patient chooses bed at [facility]     Physician recommends and patient chooses bed at  [facility])    Patient to be transferred to [facility] on 10/11/15.  Patient to be transferred to facility by [facility]     Patient family notified on 10/11/15 of transfer.  Name of family member notified:  [name], granddaughter.     PHYSICIAN        Additional Comment:    _______________________________________________ Darden Dates, LCSW 10/11/2015, 10:12 AM

## 2015-10-11 NOTE — Progress Notes (Signed)
EMS called for Non emergent transport to [facility] .

## 2015-10-11 NOTE — Care Management Note (Signed)
Case Management Note  Patient Details  Name: [name] Good MRN: [id number] Date of Birth: 1931-04-23  Subjective/Objective:   Per SW note on Friday, [name] Good daughter accepted an offer of placement at [facility].                  Action/Plan:   Expected Discharge Date:                  Expected Discharge Plan:     In-House Referral:     Discharge planning Services  CM Consult  Post Acute Care Choice:    Choice offered to:     DME Arranged:    DME Agency:     HH Arranged:    HH Agency:     Status of Service:  In process, will continue to follow  Medicare Important Message Given:  Yes Date Medicare IM Given:    Medicare IM give by:    Date Additional Medicare IM Given:    Additional Medicare Important Message give by:     If discussed at [place] of Stay Meetings, dates discussed:    Additional Comments:  [name] Good A, RN 10/11/2015, 9:09 AM

## 2015-10-11 NOTE — Clinical Social Work Note (Signed)
Pt is ready for discharge today to [facility]. Facility has received discharge informatuion and is ready to admit pt. CSW spoke to pt's granddaughter and pt, both are aware and in agreement to discharge plan. RN to call report and EMS will provide transportation. CSW is signing off a no further needs identified.   Darden Dates, MSW, [place] Social Worker  [phone]

## 2015-10-11 NOTE — Progress Notes (Signed)
Report called to [employer] at [place].

## 2015-10-12 ENCOUNTER — Other Ambulatory Visit: Payer: Self-pay | Admitting: *Deleted

## 2015-10-12 DIAGNOSIS — E118 Type 2 diabetes mellitus with unspecified complications: Secondary | ICD-10-CM

## 2015-10-12 DIAGNOSIS — J9621 Acute and chronic respiratory failure with hypoxia: Secondary | ICD-10-CM

## 2015-10-12 DIAGNOSIS — Z794 Long term (current) use of insulin: Secondary | ICD-10-CM

## 2015-10-12 DIAGNOSIS — J181 Lobar pneumonia, unspecified organism: Secondary | ICD-10-CM

## 2015-10-12 DIAGNOSIS — J189 Pneumonia, unspecified organism: Secondary | ICD-10-CM

## 2015-10-12 DIAGNOSIS — J9622 Acute and chronic respiratory failure with hypercapnia: Principal | ICD-10-CM

## 2015-10-19 ENCOUNTER — Other Ambulatory Visit: Payer: Self-pay | Admitting: *Deleted

## 2015-10-19 NOTE — Patient Outreach (Addendum)
[facility]) Care Management  [facility] Social Work  10/19/2015  [name] Good 03-02-1931 [id number]  Subjective:  Patient is a 82 year old male currently in rehabilitation at [facility] home  Objective:   Encounter Medications:  Outpatient Encounter Prescriptions as of 10/19/2015  Medication Sig  . aspirin EC 325 MG tablet Take 325 mg by mouth daily.  Marland Kitchen atorvastatin (LIPITOR) 20 MG tablet Take 10 mg by mouth daily.  . budesonide (PULMICORT) 0.25 MG/2ML nebulizer solution Take 2 mLs (0.25 mg total) by nebulization 2 (two) times daily.  Marland Kitchen diltiazem (CARDIZEM CD) 180 MG 24 hr capsule Take 1 capsule (180 mg total) by mouth daily.  Marland Kitchen docusate sodium (COLACE) 100 MG capsule Take 100 mg by mouth daily.  Marland Kitchen donepezil (ARICEPT) 10 MG tablet Take 10 mg by mouth at bedtime.  Marland Kitchen doxazosin (CARDURA) 4 MG tablet Take 4 mg by mouth at bedtime.   . furosemide (LASIX) 80 MG tablet Take 80-160 mg by mouth 2 (two) times daily. Take 6m twice daily on Sunday, Monday, Wednesday, Friday, and Saturday. Take 1661mtwice daily on Tuesday and Thursday.  . insulin glargine (LANTUS) 100 UNIT/ML injection Inject 20 Units into the skin every morning. Dr KrEnzo Montgomery. insulin regular (NOVOLIN R,HUMULIN R) 100 units/mL injection Inject 0-12 Units into the skin 3 (three) times daily before meals. Sliding scale- Dr KrEnzo Montgomery. ipratropium-albuterol (DUONEB) 0.5-2.5 (3) MG/3ML SOLN Take 3 mLs by nebulization every 6 (six) hours.  . Marland Kitchenosartan (COZAAR) 50 MG tablet Take 50 mg by mouth daily.  . Melatonin 3 MG TABS Take 3 mg by mouth at bedtime.  . meloxicam (MOBIC) 15 MG tablet Take 7.5 mg by mouth daily.  . metFORMIN (GLUCOPHAGE) 500 MG tablet Take 500 mg by mouth 2 (two) times daily with a meal.  . pramipexole (MIRAPEX) 1 MG tablet Take 1.5 tablets (1.5 mg total) by mouth at bedtime. Dr KrEnzo Montgomery. predniSONE (DELTASONE) 10 MG tablet Take 2 tabs po daily day1,2; 1 tab daily day3,4; 1/2 tab daily day5,6  .  rOPINIRole (REQUIP) 2 MG tablet Take 2 mg by mouth at bedtime.  . Marland Kitchenpironolactone (ALDACTONE) 25 MG tablet Take 25 mg by mouth daily.    No facility-administered encounter medications on file as of 10/19/2015.    Functional Status:  In your present state of health, do you have any difficulty performing the following activities: 10/19/2015 10/05/2015  Hearing? Y N  Vision? Y N  Difficulty concentrating or making decisions? N N  Walking or climbing stairs? Y Y  Dressing or bathing? N N  Doing errands, shopping? Y N  Preparing Food and eating ? N -  Using the Toilet? N -  In the past six months, have you accidently leaked urine? N -  Do you have problems with loss of bowel control? N -  Managing your Medications? Y -  Managing your Finances? Y -  Housekeeping or managing your Housekeeping? Y -    Fall/Depression Screening:  PHQ 2/9 Scores 10/19/2015 02/02/2015  PHQ - 2 Score 0 1    Assessment: This soEducation officer, [facility] and nephew at bedside.  Patient describes a postive support system.  Patient's  grandaugher [name] Good a nuMarine scientistnd is his primary caregiver. Per patient, she assists with his medications, helps to pay his bills and completes household chores.  Per patient, he has a Rolator walker, however does not have a shower chair, he usually  does not use the shower-just wipes himself down.  Patient's nephew states that he may have one that patient can use at home  This social worker spoke with discharge planner, Noreene Larsson who stated that she has spoken with patient's granddaughter and the plan is for patient to discharge home on 10/29/15 with home health.  Patient has no equipment needs. Discharge planner identified no barriers to discharge.  Plan: Patient to discharge on 10/29/15.           RNCM to be notified of patient's discharge.           This Education officer, museum to continue to assess for social work needs.    Sheralyn Boatman [facility] Care  Management [phone]

## 2015-10-30 ENCOUNTER — Encounter: Payer: Self-pay | Admitting: *Deleted

## 2015-10-30 ENCOUNTER — Other Ambulatory Visit: Payer: Self-pay | Admitting: *Deleted

## 2015-10-30 NOTE — Patient Outreach (Addendum)
[facility] [facility]) Care Management  10/30/2015  [name] Good [birth year] [id number]   Transition of care call Patient discharged from [facility] SNF on 5/18. RN placed transition of care call  to [name] Good, grand daughter caregiver ,and listed on the [facility] consent as  main contact. HIPPA identifiers confirmed. Explained purpose of call.   Reviewed medication list with [name] Good that she received from [facility] she denies having any questions regarding instructions that she has received. [name] Good reports that they are managing  at home, her major concern is getting home health physical therapy started at home so that patient can maintain his progress. Patient to have home health by [name] and she has not heard from them yet. RN placed call to [facility] home [facility] regarding initial visit with patient, they will reach out to [name] Good regarding home health. [name] Good reports patient is able to draw up and administer his own insulin, blood sugar this am 130. [name] Good manages patient medication filling his pill organizer weekly and assisting with follow up appointments.Patient was recently discharged from hospital and all medications have been reviewed.  [name] Good is familiar with [facility] and is agreeable to telephone follow up within next week, and will  let me know about home visit. No further care management needs identified at this time. Plan Patient will remain active with [facility] care management  transition of care weekly outreaches and will discuss home visit,  place call to patient caregiver, [name] Good on next week.and evaluate for further needs.   [facility] CM Care Plan Problem One        Most Recent Value   Care Plan Problem One  Patient recent discharge from skilled nursing faciliy    Role Documenting the Problem One  Care Management [place] for Problem One  Active   THN CM Short Term Goal #1 (0-30 days)  Patient will begin home health physical therapy services in the next 7days.   THN CM  Short Term Goal #1 Start Date  10/30/15   Interventions for Short Term Goal #1  RN placed telephone call to [name] to discuss contact with family regarding starting  home health therapy.   THN CM Short Term Goal #2 (0-30 days)  Patient will attend PCP home visit in the next 14 days   THN CM Short Term Goal #2 Start Date  10/30/15   Interventions for Short Term Goal #2  Caregiver works with patient's PCP and will  arrange.     Joylene Draft, RN, [place] Management [phone]- Mobile [phone]- Toll Free Main Office

## 2015-11-03 DIAGNOSIS — I5030 Unspecified diastolic (congestive) heart failure: Secondary | ICD-10-CM | POA: Diagnosis not present

## 2015-11-03 DIAGNOSIS — I872 Venous insufficiency (chronic) (peripheral): Secondary | ICD-10-CM | POA: Diagnosis not present

## 2015-11-03 DIAGNOSIS — G2581 Restless legs syndrome: Secondary | ICD-10-CM | POA: Diagnosis not present

## 2015-11-03 DIAGNOSIS — I2510 Atherosclerotic heart disease of native coronary artery without angina pectoris: Secondary | ICD-10-CM | POA: Diagnosis not present

## 2015-11-03 DIAGNOSIS — E119 Type 2 diabetes mellitus without complications: Secondary | ICD-10-CM | POA: Diagnosis not present

## 2015-11-03 DIAGNOSIS — I4891 Unspecified atrial fibrillation: Secondary | ICD-10-CM | POA: Diagnosis not present

## 2015-11-03 DIAGNOSIS — I110 Hypertensive heart disease with heart failure: Secondary | ICD-10-CM | POA: Diagnosis not present

## 2015-11-03 DIAGNOSIS — Z794 Long term (current) use of insulin: Secondary | ICD-10-CM | POA: Diagnosis not present

## 2015-11-03 DIAGNOSIS — J441 Chronic obstructive pulmonary disease with (acute) exacerbation: Secondary | ICD-10-CM | POA: Diagnosis not present

## 2015-11-03 DIAGNOSIS — E784 Other hyperlipidemia: Secondary | ICD-10-CM | POA: Diagnosis not present

## 2015-11-03 DIAGNOSIS — M1991 Primary osteoarthritis, unspecified site: Secondary | ICD-10-CM | POA: Diagnosis not present

## 2015-11-04 DIAGNOSIS — I2510 Atherosclerotic heart disease of native coronary artery without angina pectoris: Secondary | ICD-10-CM | POA: Diagnosis not present

## 2015-11-04 DIAGNOSIS — J441 Chronic obstructive pulmonary disease with (acute) exacerbation: Secondary | ICD-10-CM | POA: Diagnosis not present

## 2015-11-04 DIAGNOSIS — I110 Hypertensive heart disease with heart failure: Secondary | ICD-10-CM | POA: Diagnosis not present

## 2015-11-04 DIAGNOSIS — E784 Other hyperlipidemia: Secondary | ICD-10-CM | POA: Diagnosis not present

## 2015-11-04 DIAGNOSIS — G2581 Restless legs syndrome: Secondary | ICD-10-CM | POA: Diagnosis not present

## 2015-11-04 DIAGNOSIS — I5030 Unspecified diastolic (congestive) heart failure: Secondary | ICD-10-CM | POA: Diagnosis not present

## 2015-11-04 DIAGNOSIS — M1991 Primary osteoarthritis, unspecified site: Secondary | ICD-10-CM | POA: Diagnosis not present

## 2015-11-04 DIAGNOSIS — I872 Venous insufficiency (chronic) (peripheral): Secondary | ICD-10-CM | POA: Diagnosis not present

## 2015-11-04 DIAGNOSIS — I4891 Unspecified atrial fibrillation: Secondary | ICD-10-CM | POA: Diagnosis not present

## 2015-11-04 DIAGNOSIS — Z794 Long term (current) use of insulin: Secondary | ICD-10-CM | POA: Diagnosis not present

## 2015-11-04 DIAGNOSIS — E119 Type 2 diabetes mellitus without complications: Secondary | ICD-10-CM | POA: Diagnosis not present

## 2015-11-05 DIAGNOSIS — I110 Hypertensive heart disease with heart failure: Secondary | ICD-10-CM | POA: Diagnosis not present

## 2015-11-05 DIAGNOSIS — I2510 Atherosclerotic heart disease of native coronary artery without angina pectoris: Secondary | ICD-10-CM | POA: Diagnosis not present

## 2015-11-05 DIAGNOSIS — E119 Type 2 diabetes mellitus without complications: Secondary | ICD-10-CM | POA: Diagnosis not present

## 2015-11-05 DIAGNOSIS — I5030 Unspecified diastolic (congestive) heart failure: Secondary | ICD-10-CM | POA: Diagnosis not present

## 2015-11-05 DIAGNOSIS — M1991 Primary osteoarthritis, unspecified site: Secondary | ICD-10-CM | POA: Diagnosis not present

## 2015-11-05 DIAGNOSIS — J441 Chronic obstructive pulmonary disease with (acute) exacerbation: Secondary | ICD-10-CM | POA: Diagnosis not present

## 2015-11-05 DIAGNOSIS — I4891 Unspecified atrial fibrillation: Secondary | ICD-10-CM | POA: Diagnosis not present

## 2015-11-05 DIAGNOSIS — E784 Other hyperlipidemia: Secondary | ICD-10-CM | POA: Diagnosis not present

## 2015-11-05 DIAGNOSIS — Z794 Long term (current) use of insulin: Secondary | ICD-10-CM | POA: Diagnosis not present

## 2015-11-05 DIAGNOSIS — I872 Venous insufficiency (chronic) (peripheral): Secondary | ICD-10-CM | POA: Diagnosis not present

## 2015-11-05 DIAGNOSIS — G2581 Restless legs syndrome: Secondary | ICD-10-CM | POA: Diagnosis not present

## 2015-11-06 ENCOUNTER — Other Ambulatory Visit: Payer: Self-pay | Admitting: *Deleted

## 2015-11-06 ENCOUNTER — Encounter: Payer: Self-pay | Admitting: *Deleted

## 2015-11-06 DIAGNOSIS — I4891 Unspecified atrial fibrillation: Secondary | ICD-10-CM | POA: Diagnosis not present

## 2015-11-06 DIAGNOSIS — E784 Other hyperlipidemia: Secondary | ICD-10-CM | POA: Diagnosis not present

## 2015-11-06 DIAGNOSIS — Z794 Long term (current) use of insulin: Secondary | ICD-10-CM | POA: Diagnosis not present

## 2015-11-06 DIAGNOSIS — G2581 Restless legs syndrome: Secondary | ICD-10-CM | POA: Diagnosis not present

## 2015-11-06 DIAGNOSIS — I110 Hypertensive heart disease with heart failure: Secondary | ICD-10-CM | POA: Diagnosis not present

## 2015-11-06 DIAGNOSIS — J441 Chronic obstructive pulmonary disease with (acute) exacerbation: Secondary | ICD-10-CM | POA: Diagnosis not present

## 2015-11-06 DIAGNOSIS — I872 Venous insufficiency (chronic) (peripheral): Secondary | ICD-10-CM | POA: Diagnosis not present

## 2015-11-06 DIAGNOSIS — E119 Type 2 diabetes mellitus without complications: Secondary | ICD-10-CM | POA: Diagnosis not present

## 2015-11-06 DIAGNOSIS — M1991 Primary osteoarthritis, unspecified site: Secondary | ICD-10-CM | POA: Diagnosis not present

## 2015-11-06 DIAGNOSIS — I2510 Atherosclerotic heart disease of native coronary artery without angina pectoris: Secondary | ICD-10-CM | POA: Diagnosis not present

## 2015-11-06 DIAGNOSIS — I5030 Unspecified diastolic (congestive) heart failure: Secondary | ICD-10-CM | POA: Diagnosis not present

## 2015-11-06 NOTE — Patient Outreach (Signed)
[facility]) Care Management  [place]  11/06/2015   [name] Good 09-17-1930 [id number]  Subjective: RN placed call to patient as part of the transition of care program, HIPAA  identifiers  Verified, explained purpose of call.  [name] Good reports that he is doing pretty good on today,he reports his blood sugar is 137 on today. [name] Good requested that I call his grand daughter [name] Good as she is helping out with him.    Encounter Medications:  Outpatient Encounter Prescriptions as of 11/06/2015  Medication Sig  . aspirin EC 325 MG tablet Take 325 mg by mouth daily.  Marland Kitchen atorvastatin (LIPITOR) 20 MG tablet Take 10 mg by mouth daily.  . budesonide (PULMICORT) 0.25 MG/2ML nebulizer solution Take 2 mLs (0.25 mg total) by nebulization 2 (two) times daily. (Patient not taking: Reported on 10/30/2015)  . diltiazem (CARDIZEM CD) 180 MG 24 hr capsule Take 1 capsule (180 mg total) by mouth daily.  Marland Kitchen docusate sodium (COLACE) 100 MG capsule Take 100 mg by mouth daily.  Marland Kitchen donepezil (ARICEPT) 10 MG tablet Take 10 mg by mouth at bedtime.  Marland Kitchen doxazosin (CARDURA) 4 MG tablet Take 4 mg by mouth at bedtime.   . furosemide (LASIX) 80 MG tablet Take 80-160 mg by mouth 2 (two) times daily. Take 80mg  twice daily on Sunday, Monday, Wednesday, Friday, and Saturday. Take 160mg  twice daily on Tuesday and Thursday.  . insulin glargine (LANTUS) 100 UNIT/ML injection Inject 20 Units into the skin every morning. [name] Good  . insulin regular (NOVOLIN R,HUMULIN R) 100 units/mL injection Inject 0-12 Units into the skin 3 (three) times daily before meals. Reported on 10/30/2015  . ipratropium-albuterol (DUONEB) 0.5-2.5 (3) MG/3ML SOLN Take 3 mLs by nebulization every 6 (six) hours. (Patient not taking: Reported on 10/30/2015)  . losartan (COZAAR) 50 MG tablet Take 50 mg by mouth daily.  . Melatonin 3 MG TABS Take 3 mg by mouth at bedtime.  . meloxicam (MOBIC) 15 MG tablet Take 7.5 mg by mouth daily.   . metFORMIN (GLUCOPHAGE) 500 MG tablet Take 500 mg by mouth 2 (two) times daily with a meal.  . pramipexole (MIRAPEX) 1 MG tablet Take 1.5 tablets (1.5 mg total) by mouth at bedtime. [name] Good  . predniSONE (DELTASONE) 10 MG tablet Take 2 tabs po daily day1,2; 1 tab daily day3,4; 1/2 tab daily day5,6 (Patient not taking: Reported on 10/30/2015)  . rOPINIRole (REQUIP) 2 MG tablet Take 2 mg by mouth at bedtime.  Marland Kitchen spironolactone (ALDACTONE) 25 MG tablet Take 25 mg by mouth daily.    No facility-administered encounter medications on file as of 11/06/2015.    Functional Status:  In your present state of health, do you have any difficulty performing the following activities: 10/19/2015 10/19/2015  Hearing? - Y  Vision? - Y  Difficulty concentrating or making decisions? - N  Walking or climbing stairs? - Y  Dressing or bathing? - N  Doing errands, shopping? - Y  Preparing Food and eating ? - -  Using the Toilet? - -  In the past six months, have you accidently leaked urine? - -  Do you have problems with loss of bowel control? - -  Managing your Medications? (No Data) -  Managing your Finances? (No Data) -  Housekeeping or managing your Housekeeping? (No Data) -    Fall/Depression Screening: PHQ 2/9 Scores 11/06/2015 10/19/2015 02/02/2015  PHQ - 2 Score 0 0 1    Assessment:  [name] Good able to answer  most of the assessment questions but asked that I call [name] Good . RN placed call to [name] Good , she reports that patient is doing well at home , [name] Good home health physical therapy, home health aide and social worker are following patient. [name] Good reports patient is well taken care of , she states she works for  [name] Good patient;s PCP and she  is involved in his care. [name] Good states no other needs at present, they are managing well at home and not sure that weekly calls are necessary, she states she will call us if needed. She states [name] Good will get more confused with more people calling and coming into  his home.  Plan Will follow up with [name] care management, prior to possible closing case and RN will notify [name] Good on next as discussed.  [facility] CM Care Plan Problem One        Most Recent Value   Care Plan Problem One  Patient recent discharge from skilled nursing faciliy    Role Documenting the Problem One  Care Management [place] for Problem One  Active   THN CM Short Term Goal #1 (0-30 days)  Patient will begin home health physical therapy services in the next 7days.   THN CM Short Term Goal #1 Start Date  10/30/15   Interventions for Short Term Goal #1  RN placed telephone call to [name] Good to discuss contact with family regarding starting  home health therapy.   THN CM Short Term Goal #2 (0-30 days)  Patient will attend PCP home visit in the next 14 days   THN CM Short Term Goal #2 Start Date  10/30/15   Interventions for Short Term Goal #2  Caregiver reports [name] Good is involved in patient care     [name] Draft, RN, [place] Management [phone]- Mobile [phone]- Toll Free Main Office    Plan:

## 2015-11-10 DIAGNOSIS — E119 Type 2 diabetes mellitus without complications: Secondary | ICD-10-CM | POA: Diagnosis not present

## 2015-11-10 DIAGNOSIS — G2581 Restless legs syndrome: Secondary | ICD-10-CM | POA: Diagnosis not present

## 2015-11-10 DIAGNOSIS — I4891 Unspecified atrial fibrillation: Secondary | ICD-10-CM | POA: Diagnosis not present

## 2015-11-10 DIAGNOSIS — M1991 Primary osteoarthritis, unspecified site: Secondary | ICD-10-CM | POA: Diagnosis not present

## 2015-11-10 DIAGNOSIS — Z794 Long term (current) use of insulin: Secondary | ICD-10-CM | POA: Diagnosis not present

## 2015-11-10 DIAGNOSIS — I872 Venous insufficiency (chronic) (peripheral): Secondary | ICD-10-CM | POA: Diagnosis not present

## 2015-11-10 DIAGNOSIS — E784 Other hyperlipidemia: Secondary | ICD-10-CM | POA: Diagnosis not present

## 2015-11-10 DIAGNOSIS — J441 Chronic obstructive pulmonary disease with (acute) exacerbation: Secondary | ICD-10-CM | POA: Diagnosis not present

## 2015-11-10 DIAGNOSIS — I5030 Unspecified diastolic (congestive) heart failure: Secondary | ICD-10-CM | POA: Diagnosis not present

## 2015-11-10 DIAGNOSIS — I2510 Atherosclerotic heart disease of native coronary artery without angina pectoris: Secondary | ICD-10-CM | POA: Diagnosis not present

## 2015-11-10 DIAGNOSIS — I110 Hypertensive heart disease with heart failure: Secondary | ICD-10-CM | POA: Diagnosis not present

## 2015-11-11 ENCOUNTER — Ambulatory Visit: Payer: Self-pay | Admitting: *Deleted

## 2015-11-11 ENCOUNTER — Other Ambulatory Visit: Payer: Self-pay | Admitting: *Deleted

## 2015-11-11 DIAGNOSIS — I5030 Unspecified diastolic (congestive) heart failure: Secondary | ICD-10-CM | POA: Diagnosis not present

## 2015-11-11 DIAGNOSIS — J441 Chronic obstructive pulmonary disease with (acute) exacerbation: Secondary | ICD-10-CM | POA: Diagnosis not present

## 2015-11-11 DIAGNOSIS — E119 Type 2 diabetes mellitus without complications: Secondary | ICD-10-CM | POA: Diagnosis not present

## 2015-11-11 DIAGNOSIS — E784 Other hyperlipidemia: Secondary | ICD-10-CM | POA: Diagnosis not present

## 2015-11-11 DIAGNOSIS — I110 Hypertensive heart disease with heart failure: Secondary | ICD-10-CM | POA: Diagnosis not present

## 2015-11-11 DIAGNOSIS — Z794 Long term (current) use of insulin: Secondary | ICD-10-CM | POA: Diagnosis not present

## 2015-11-11 DIAGNOSIS — I2510 Atherosclerotic heart disease of native coronary artery without angina pectoris: Secondary | ICD-10-CM | POA: Diagnosis not present

## 2015-11-11 DIAGNOSIS — M1991 Primary osteoarthritis, unspecified site: Secondary | ICD-10-CM | POA: Diagnosis not present

## 2015-11-11 DIAGNOSIS — I872 Venous insufficiency (chronic) (peripheral): Secondary | ICD-10-CM | POA: Diagnosis not present

## 2015-11-11 DIAGNOSIS — I4891 Unspecified atrial fibrillation: Secondary | ICD-10-CM | POA: Diagnosis not present

## 2015-11-11 DIAGNOSIS — G2581 Restless legs syndrome: Secondary | ICD-10-CM | POA: Diagnosis not present

## 2015-11-11 NOTE — Patient Outreach (Signed)
[facility]) Care Management  11/11/2015  [name] Good Aug 20, 1930 [id number]   Phone call to patient to assess for continued social work needs.  Per patient, he is doing well at home.  Per patient, he has all of his medications and positive family support. Per patient, his granddaughter [name] Good, is a Marine scientist and provides assistance with his care.   Plan:  Patient's case to be closed to social work at this time.           Per record, review RNCM plans to close case as well, providers office to be notified.   [name] Good [facility] Care Management [phone]

## 2015-11-12 ENCOUNTER — Encounter: Payer: Self-pay | Admitting: *Deleted

## 2015-11-12 ENCOUNTER — Other Ambulatory Visit: Payer: Self-pay | Admitting: *Deleted

## 2015-11-12 DIAGNOSIS — Z794 Long term (current) use of insulin: Secondary | ICD-10-CM | POA: Diagnosis not present

## 2015-11-12 DIAGNOSIS — G2581 Restless legs syndrome: Secondary | ICD-10-CM | POA: Diagnosis not present

## 2015-11-12 DIAGNOSIS — I110 Hypertensive heart disease with heart failure: Secondary | ICD-10-CM | POA: Diagnosis not present

## 2015-11-12 DIAGNOSIS — J441 Chronic obstructive pulmonary disease with (acute) exacerbation: Secondary | ICD-10-CM | POA: Diagnosis not present

## 2015-11-12 DIAGNOSIS — E784 Other hyperlipidemia: Secondary | ICD-10-CM | POA: Diagnosis not present

## 2015-11-12 DIAGNOSIS — I5030 Unspecified diastolic (congestive) heart failure: Secondary | ICD-10-CM | POA: Diagnosis not present

## 2015-11-12 DIAGNOSIS — I4891 Unspecified atrial fibrillation: Secondary | ICD-10-CM | POA: Diagnosis not present

## 2015-11-12 DIAGNOSIS — M1991 Primary osteoarthritis, unspecified site: Secondary | ICD-10-CM | POA: Diagnosis not present

## 2015-11-12 DIAGNOSIS — I872 Venous insufficiency (chronic) (peripheral): Secondary | ICD-10-CM | POA: Diagnosis not present

## 2015-11-12 DIAGNOSIS — I2510 Atherosclerotic heart disease of native coronary artery without angina pectoris: Secondary | ICD-10-CM | POA: Diagnosis not present

## 2015-11-12 DIAGNOSIS — E119 Type 2 diabetes mellitus without complications: Secondary | ICD-10-CM | POA: Diagnosis not present

## 2015-11-12 NOTE — Patient Outreach (Signed)
[facility]) Care Management  11/12/2015  Ebon Stables 10/14/1930 [id number]   Transition of care note  RNCM placed telephone call to Mr.Meacham's caregiver , Berton Mount she reports that patient is progressing well at home , she discussed about the  many people coming into his Home health to care for him and contacting her daily. [name] voiced concern regarding the patient may get confused.   [name] discussed that the patient's care is being managed well at home and does not see the need or  want to continue with the transition of care program calls or home visits at this time.  Discussed with [name] the health coach program as an option, not interested at this time. Encouraged [name] to notify [facility] if she has a need for our services at any time in the future. [name] has the contact information for [facility].  Plan RNCM will close case per protocol and notify MD.   Joylene Draft, RN, [place] Management [phone]- Mobile [phone]- [place] Office

## 2015-11-13 DIAGNOSIS — I110 Hypertensive heart disease with heart failure: Secondary | ICD-10-CM | POA: Diagnosis not present

## 2015-11-13 DIAGNOSIS — E784 Other hyperlipidemia: Secondary | ICD-10-CM | POA: Diagnosis not present

## 2015-11-13 DIAGNOSIS — I2510 Atherosclerotic heart disease of native coronary artery without angina pectoris: Secondary | ICD-10-CM | POA: Diagnosis not present

## 2015-11-13 DIAGNOSIS — I4891 Unspecified atrial fibrillation: Secondary | ICD-10-CM | POA: Diagnosis not present

## 2015-11-13 DIAGNOSIS — I5030 Unspecified diastolic (congestive) heart failure: Secondary | ICD-10-CM | POA: Diagnosis not present

## 2015-11-13 DIAGNOSIS — G2581 Restless legs syndrome: Secondary | ICD-10-CM | POA: Diagnosis not present

## 2015-11-13 DIAGNOSIS — M1991 Primary osteoarthritis, unspecified site: Secondary | ICD-10-CM | POA: Diagnosis not present

## 2015-11-13 DIAGNOSIS — Z794 Long term (current) use of insulin: Secondary | ICD-10-CM | POA: Diagnosis not present

## 2015-11-13 DIAGNOSIS — I872 Venous insufficiency (chronic) (peripheral): Secondary | ICD-10-CM | POA: Diagnosis not present

## 2015-11-13 DIAGNOSIS — E119 Type 2 diabetes mellitus without complications: Secondary | ICD-10-CM | POA: Diagnosis not present

## 2015-11-13 DIAGNOSIS — J441 Chronic obstructive pulmonary disease with (acute) exacerbation: Secondary | ICD-10-CM | POA: Diagnosis not present

## 2015-11-17 DIAGNOSIS — Z794 Long term (current) use of insulin: Secondary | ICD-10-CM | POA: Diagnosis not present

## 2015-11-17 DIAGNOSIS — G2581 Restless legs syndrome: Secondary | ICD-10-CM | POA: Diagnosis not present

## 2015-11-17 DIAGNOSIS — I2510 Atherosclerotic heart disease of native coronary artery without angina pectoris: Secondary | ICD-10-CM | POA: Diagnosis not present

## 2015-11-17 DIAGNOSIS — I5030 Unspecified diastolic (congestive) heart failure: Secondary | ICD-10-CM | POA: Diagnosis not present

## 2015-11-17 DIAGNOSIS — I110 Hypertensive heart disease with heart failure: Secondary | ICD-10-CM | POA: Diagnosis not present

## 2015-11-17 DIAGNOSIS — I872 Venous insufficiency (chronic) (peripheral): Secondary | ICD-10-CM | POA: Diagnosis not present

## 2015-11-17 DIAGNOSIS — J441 Chronic obstructive pulmonary disease with (acute) exacerbation: Secondary | ICD-10-CM | POA: Diagnosis not present

## 2015-11-17 DIAGNOSIS — E784 Other hyperlipidemia: Secondary | ICD-10-CM | POA: Diagnosis not present

## 2015-11-17 DIAGNOSIS — E119 Type 2 diabetes mellitus without complications: Secondary | ICD-10-CM | POA: Diagnosis not present

## 2015-11-17 DIAGNOSIS — I4891 Unspecified atrial fibrillation: Secondary | ICD-10-CM | POA: Diagnosis not present

## 2015-11-17 DIAGNOSIS — M1991 Primary osteoarthritis, unspecified site: Secondary | ICD-10-CM | POA: Diagnosis not present

## 2015-11-19 DIAGNOSIS — I5030 Unspecified diastolic (congestive) heart failure: Secondary | ICD-10-CM | POA: Diagnosis not present

## 2015-11-19 DIAGNOSIS — I2510 Atherosclerotic heart disease of native coronary artery without angina pectoris: Secondary | ICD-10-CM | POA: Diagnosis not present

## 2015-11-19 DIAGNOSIS — I110 Hypertensive heart disease with heart failure: Secondary | ICD-10-CM | POA: Diagnosis not present

## 2015-11-19 DIAGNOSIS — E119 Type 2 diabetes mellitus without complications: Secondary | ICD-10-CM | POA: Diagnosis not present

## 2015-11-19 DIAGNOSIS — J441 Chronic obstructive pulmonary disease with (acute) exacerbation: Secondary | ICD-10-CM | POA: Diagnosis not present

## 2015-11-19 DIAGNOSIS — E784 Other hyperlipidemia: Secondary | ICD-10-CM | POA: Diagnosis not present

## 2015-11-19 DIAGNOSIS — I872 Venous insufficiency (chronic) (peripheral): Secondary | ICD-10-CM | POA: Diagnosis not present

## 2015-11-19 DIAGNOSIS — Z794 Long term (current) use of insulin: Secondary | ICD-10-CM | POA: Diagnosis not present

## 2015-11-19 DIAGNOSIS — M1991 Primary osteoarthritis, unspecified site: Secondary | ICD-10-CM | POA: Diagnosis not present

## 2015-11-19 DIAGNOSIS — G2581 Restless legs syndrome: Secondary | ICD-10-CM | POA: Diagnosis not present

## 2015-11-19 DIAGNOSIS — I4891 Unspecified atrial fibrillation: Secondary | ICD-10-CM | POA: Diagnosis not present

## 2015-11-22 DIAGNOSIS — J441 Chronic obstructive pulmonary disease with (acute) exacerbation: Secondary | ICD-10-CM | POA: Diagnosis not present

## 2015-11-22 DIAGNOSIS — I4891 Unspecified atrial fibrillation: Secondary | ICD-10-CM | POA: Diagnosis not present

## 2015-11-22 DIAGNOSIS — I5030 Unspecified diastolic (congestive) heart failure: Secondary | ICD-10-CM | POA: Diagnosis not present

## 2015-11-22 DIAGNOSIS — I110 Hypertensive heart disease with heart failure: Secondary | ICD-10-CM | POA: Diagnosis not present

## 2015-11-22 DIAGNOSIS — I2510 Atherosclerotic heart disease of native coronary artery without angina pectoris: Secondary | ICD-10-CM | POA: Diagnosis not present

## 2015-11-22 DIAGNOSIS — G2581 Restless legs syndrome: Secondary | ICD-10-CM | POA: Diagnosis not present

## 2015-11-22 DIAGNOSIS — E119 Type 2 diabetes mellitus without complications: Secondary | ICD-10-CM | POA: Diagnosis not present

## 2015-11-22 DIAGNOSIS — M1991 Primary osteoarthritis, unspecified site: Secondary | ICD-10-CM | POA: Diagnosis not present

## 2015-11-22 DIAGNOSIS — I872 Venous insufficiency (chronic) (peripheral): Secondary | ICD-10-CM | POA: Diagnosis not present

## 2015-11-22 DIAGNOSIS — E784 Other hyperlipidemia: Secondary | ICD-10-CM | POA: Diagnosis not present

## 2015-11-22 DIAGNOSIS — Z794 Long term (current) use of insulin: Secondary | ICD-10-CM | POA: Diagnosis not present

## 2015-11-25 DIAGNOSIS — I2510 Atherosclerotic heart disease of native coronary artery without angina pectoris: Secondary | ICD-10-CM | POA: Diagnosis not present

## 2015-11-25 DIAGNOSIS — E784 Other hyperlipidemia: Secondary | ICD-10-CM | POA: Diagnosis not present

## 2015-11-25 DIAGNOSIS — I4891 Unspecified atrial fibrillation: Secondary | ICD-10-CM | POA: Diagnosis not present

## 2015-11-25 DIAGNOSIS — M1991 Primary osteoarthritis, unspecified site: Secondary | ICD-10-CM | POA: Diagnosis not present

## 2015-11-25 DIAGNOSIS — I872 Venous insufficiency (chronic) (peripheral): Secondary | ICD-10-CM | POA: Diagnosis not present

## 2015-11-25 DIAGNOSIS — Z794 Long term (current) use of insulin: Secondary | ICD-10-CM | POA: Diagnosis not present

## 2015-11-25 DIAGNOSIS — I5030 Unspecified diastolic (congestive) heart failure: Secondary | ICD-10-CM | POA: Diagnosis not present

## 2015-11-25 DIAGNOSIS — I110 Hypertensive heart disease with heart failure: Secondary | ICD-10-CM | POA: Diagnosis not present

## 2015-11-25 DIAGNOSIS — E119 Type 2 diabetes mellitus without complications: Secondary | ICD-10-CM | POA: Diagnosis not present

## 2015-11-25 DIAGNOSIS — J441 Chronic obstructive pulmonary disease with (acute) exacerbation: Secondary | ICD-10-CM | POA: Diagnosis not present

## 2015-11-25 DIAGNOSIS — G2581 Restless legs syndrome: Secondary | ICD-10-CM | POA: Diagnosis not present

## 2015-11-26 DIAGNOSIS — I4891 Unspecified atrial fibrillation: Secondary | ICD-10-CM | POA: Diagnosis not present

## 2015-11-26 DIAGNOSIS — Z794 Long term (current) use of insulin: Secondary | ICD-10-CM | POA: Diagnosis not present

## 2015-11-26 DIAGNOSIS — E119 Type 2 diabetes mellitus without complications: Secondary | ICD-10-CM | POA: Diagnosis not present

## 2015-11-26 DIAGNOSIS — I5030 Unspecified diastolic (congestive) heart failure: Secondary | ICD-10-CM | POA: Diagnosis not present

## 2015-11-26 DIAGNOSIS — E784 Other hyperlipidemia: Secondary | ICD-10-CM | POA: Diagnosis not present

## 2015-11-26 DIAGNOSIS — J441 Chronic obstructive pulmonary disease with (acute) exacerbation: Secondary | ICD-10-CM | POA: Diagnosis not present

## 2015-11-26 DIAGNOSIS — I2510 Atherosclerotic heart disease of native coronary artery without angina pectoris: Secondary | ICD-10-CM | POA: Diagnosis not present

## 2015-11-26 DIAGNOSIS — G2581 Restless legs syndrome: Secondary | ICD-10-CM | POA: Diagnosis not present

## 2015-11-26 DIAGNOSIS — I872 Venous insufficiency (chronic) (peripheral): Secondary | ICD-10-CM | POA: Diagnosis not present

## 2015-11-26 DIAGNOSIS — I110 Hypertensive heart disease with heart failure: Secondary | ICD-10-CM | POA: Diagnosis not present

## 2015-11-26 DIAGNOSIS — M1991 Primary osteoarthritis, unspecified site: Secondary | ICD-10-CM | POA: Diagnosis not present

## 2015-11-26 NOTE — Care Management (Signed)
Post discharge: discharge summary faxed to [facility].

## 2015-11-27 DIAGNOSIS — I2510 Atherosclerotic heart disease of native coronary artery without angina pectoris: Secondary | ICD-10-CM | POA: Diagnosis not present

## 2015-11-27 DIAGNOSIS — I5030 Unspecified diastolic (congestive) heart failure: Secondary | ICD-10-CM | POA: Diagnosis not present

## 2015-11-27 DIAGNOSIS — I872 Venous insufficiency (chronic) (peripheral): Secondary | ICD-10-CM | POA: Diagnosis not present

## 2015-11-27 DIAGNOSIS — M1991 Primary osteoarthritis, unspecified site: Secondary | ICD-10-CM | POA: Diagnosis not present

## 2015-11-27 DIAGNOSIS — Z794 Long term (current) use of insulin: Secondary | ICD-10-CM | POA: Diagnosis not present

## 2015-11-27 DIAGNOSIS — I4891 Unspecified atrial fibrillation: Secondary | ICD-10-CM | POA: Diagnosis not present

## 2015-11-27 DIAGNOSIS — J441 Chronic obstructive pulmonary disease with (acute) exacerbation: Secondary | ICD-10-CM | POA: Diagnosis not present

## 2015-11-27 DIAGNOSIS — E784 Other hyperlipidemia: Secondary | ICD-10-CM | POA: Diagnosis not present

## 2015-11-27 DIAGNOSIS — I110 Hypertensive heart disease with heart failure: Secondary | ICD-10-CM | POA: Diagnosis not present

## 2015-11-27 DIAGNOSIS — G2581 Restless legs syndrome: Secondary | ICD-10-CM | POA: Diagnosis not present

## 2015-11-27 DIAGNOSIS — E119 Type 2 diabetes mellitus without complications: Secondary | ICD-10-CM | POA: Diagnosis not present

## 2015-11-30 DIAGNOSIS — M1991 Primary osteoarthritis, unspecified site: Secondary | ICD-10-CM | POA: Diagnosis not present

## 2015-11-30 DIAGNOSIS — I872 Venous insufficiency (chronic) (peripheral): Secondary | ICD-10-CM | POA: Diagnosis not present

## 2015-11-30 DIAGNOSIS — J441 Chronic obstructive pulmonary disease with (acute) exacerbation: Secondary | ICD-10-CM | POA: Diagnosis not present

## 2015-11-30 DIAGNOSIS — I110 Hypertensive heart disease with heart failure: Secondary | ICD-10-CM | POA: Diagnosis not present

## 2015-11-30 DIAGNOSIS — I2510 Atherosclerotic heart disease of native coronary artery without angina pectoris: Secondary | ICD-10-CM | POA: Diagnosis not present

## 2015-11-30 DIAGNOSIS — I5030 Unspecified diastolic (congestive) heart failure: Secondary | ICD-10-CM | POA: Diagnosis not present

## 2015-11-30 DIAGNOSIS — Z794 Long term (current) use of insulin: Secondary | ICD-10-CM | POA: Diagnosis not present

## 2015-11-30 DIAGNOSIS — G2581 Restless legs syndrome: Secondary | ICD-10-CM | POA: Diagnosis not present

## 2015-11-30 DIAGNOSIS — E784 Other hyperlipidemia: Secondary | ICD-10-CM | POA: Diagnosis not present

## 2015-11-30 DIAGNOSIS — E119 Type 2 diabetes mellitus without complications: Secondary | ICD-10-CM | POA: Diagnosis not present

## 2015-11-30 DIAGNOSIS — I4891 Unspecified atrial fibrillation: Secondary | ICD-10-CM | POA: Diagnosis not present

## 2015-12-01 DIAGNOSIS — I5030 Unspecified diastolic (congestive) heart failure: Secondary | ICD-10-CM | POA: Diagnosis not present

## 2015-12-01 DIAGNOSIS — J441 Chronic obstructive pulmonary disease with (acute) exacerbation: Secondary | ICD-10-CM | POA: Diagnosis not present

## 2015-12-01 DIAGNOSIS — I2510 Atherosclerotic heart disease of native coronary artery without angina pectoris: Secondary | ICD-10-CM | POA: Diagnosis not present

## 2015-12-01 DIAGNOSIS — E119 Type 2 diabetes mellitus without complications: Secondary | ICD-10-CM | POA: Diagnosis not present

## 2015-12-01 DIAGNOSIS — E784 Other hyperlipidemia: Secondary | ICD-10-CM | POA: Diagnosis not present

## 2015-12-01 DIAGNOSIS — I872 Venous insufficiency (chronic) (peripheral): Secondary | ICD-10-CM | POA: Diagnosis not present

## 2015-12-01 DIAGNOSIS — I4891 Unspecified atrial fibrillation: Secondary | ICD-10-CM | POA: Diagnosis not present

## 2015-12-01 DIAGNOSIS — G2581 Restless legs syndrome: Secondary | ICD-10-CM | POA: Diagnosis not present

## 2015-12-01 DIAGNOSIS — I110 Hypertensive heart disease with heart failure: Secondary | ICD-10-CM | POA: Diagnosis not present

## 2015-12-01 DIAGNOSIS — M1991 Primary osteoarthritis, unspecified site: Secondary | ICD-10-CM | POA: Diagnosis not present

## 2015-12-01 DIAGNOSIS — Z794 Long term (current) use of insulin: Secondary | ICD-10-CM | POA: Diagnosis not present

## 2015-12-02 DIAGNOSIS — M1991 Primary osteoarthritis, unspecified site: Secondary | ICD-10-CM | POA: Diagnosis not present

## 2015-12-02 DIAGNOSIS — Z794 Long term (current) use of insulin: Secondary | ICD-10-CM | POA: Diagnosis not present

## 2015-12-02 DIAGNOSIS — J441 Chronic obstructive pulmonary disease with (acute) exacerbation: Secondary | ICD-10-CM | POA: Diagnosis not present

## 2015-12-02 DIAGNOSIS — E119 Type 2 diabetes mellitus without complications: Secondary | ICD-10-CM | POA: Diagnosis not present

## 2015-12-02 DIAGNOSIS — I872 Venous insufficiency (chronic) (peripheral): Secondary | ICD-10-CM | POA: Diagnosis not present

## 2015-12-02 DIAGNOSIS — I110 Hypertensive heart disease with heart failure: Secondary | ICD-10-CM | POA: Diagnosis not present

## 2015-12-02 DIAGNOSIS — I2510 Atherosclerotic heart disease of native coronary artery without angina pectoris: Secondary | ICD-10-CM | POA: Diagnosis not present

## 2015-12-02 DIAGNOSIS — I4891 Unspecified atrial fibrillation: Secondary | ICD-10-CM | POA: Diagnosis not present

## 2015-12-02 DIAGNOSIS — I5030 Unspecified diastolic (congestive) heart failure: Secondary | ICD-10-CM | POA: Diagnosis not present

## 2015-12-02 DIAGNOSIS — E784 Other hyperlipidemia: Secondary | ICD-10-CM | POA: Diagnosis not present

## 2015-12-02 DIAGNOSIS — G2581 Restless legs syndrome: Secondary | ICD-10-CM | POA: Diagnosis not present

## 2015-12-03 DIAGNOSIS — E119 Type 2 diabetes mellitus without complications: Secondary | ICD-10-CM | POA: Diagnosis not present

## 2015-12-03 DIAGNOSIS — M1991 Primary osteoarthritis, unspecified site: Secondary | ICD-10-CM | POA: Diagnosis not present

## 2015-12-03 DIAGNOSIS — I2510 Atherosclerotic heart disease of native coronary artery without angina pectoris: Secondary | ICD-10-CM | POA: Diagnosis not present

## 2015-12-03 DIAGNOSIS — J441 Chronic obstructive pulmonary disease with (acute) exacerbation: Secondary | ICD-10-CM | POA: Diagnosis not present

## 2015-12-03 DIAGNOSIS — G2581 Restless legs syndrome: Secondary | ICD-10-CM | POA: Diagnosis not present

## 2015-12-03 DIAGNOSIS — I4891 Unspecified atrial fibrillation: Secondary | ICD-10-CM | POA: Diagnosis not present

## 2015-12-03 DIAGNOSIS — I5030 Unspecified diastolic (congestive) heart failure: Secondary | ICD-10-CM | POA: Diagnosis not present

## 2015-12-03 DIAGNOSIS — I110 Hypertensive heart disease with heart failure: Secondary | ICD-10-CM | POA: Diagnosis not present

## 2015-12-03 DIAGNOSIS — I872 Venous insufficiency (chronic) (peripheral): Secondary | ICD-10-CM | POA: Diagnosis not present

## 2015-12-03 DIAGNOSIS — E784 Other hyperlipidemia: Secondary | ICD-10-CM | POA: Diagnosis not present

## 2015-12-03 DIAGNOSIS — Z794 Long term (current) use of insulin: Secondary | ICD-10-CM | POA: Diagnosis not present

## 2015-12-08 DIAGNOSIS — I4891 Unspecified atrial fibrillation: Secondary | ICD-10-CM | POA: Diagnosis not present

## 2015-12-08 DIAGNOSIS — M1991 Primary osteoarthritis, unspecified site: Secondary | ICD-10-CM | POA: Diagnosis not present

## 2015-12-08 DIAGNOSIS — E784 Other hyperlipidemia: Secondary | ICD-10-CM | POA: Diagnosis not present

## 2015-12-08 DIAGNOSIS — I2510 Atherosclerotic heart disease of native coronary artery without angina pectoris: Secondary | ICD-10-CM | POA: Diagnosis not present

## 2015-12-08 DIAGNOSIS — Z794 Long term (current) use of insulin: Secondary | ICD-10-CM | POA: Diagnosis not present

## 2015-12-08 DIAGNOSIS — I110 Hypertensive heart disease with heart failure: Secondary | ICD-10-CM | POA: Diagnosis not present

## 2015-12-08 DIAGNOSIS — J441 Chronic obstructive pulmonary disease with (acute) exacerbation: Secondary | ICD-10-CM | POA: Diagnosis not present

## 2015-12-08 DIAGNOSIS — E119 Type 2 diabetes mellitus without complications: Secondary | ICD-10-CM | POA: Diagnosis not present

## 2015-12-08 DIAGNOSIS — G2581 Restless legs syndrome: Secondary | ICD-10-CM | POA: Diagnosis not present

## 2015-12-08 DIAGNOSIS — I5030 Unspecified diastolic (congestive) heart failure: Secondary | ICD-10-CM | POA: Diagnosis not present

## 2015-12-08 DIAGNOSIS — I872 Venous insufficiency (chronic) (peripheral): Secondary | ICD-10-CM | POA: Diagnosis not present

## 2015-12-10 DIAGNOSIS — M1991 Primary osteoarthritis, unspecified site: Secondary | ICD-10-CM | POA: Diagnosis not present

## 2015-12-10 DIAGNOSIS — Z794 Long term (current) use of insulin: Secondary | ICD-10-CM | POA: Diagnosis not present

## 2015-12-10 DIAGNOSIS — G2581 Restless legs syndrome: Secondary | ICD-10-CM | POA: Diagnosis not present

## 2015-12-10 DIAGNOSIS — E784 Other hyperlipidemia: Secondary | ICD-10-CM | POA: Diagnosis not present

## 2015-12-10 DIAGNOSIS — I5030 Unspecified diastolic (congestive) heart failure: Secondary | ICD-10-CM | POA: Diagnosis not present

## 2015-12-10 DIAGNOSIS — I110 Hypertensive heart disease with heart failure: Secondary | ICD-10-CM | POA: Diagnosis not present

## 2015-12-10 DIAGNOSIS — I4891 Unspecified atrial fibrillation: Secondary | ICD-10-CM | POA: Diagnosis not present

## 2015-12-10 DIAGNOSIS — J441 Chronic obstructive pulmonary disease with (acute) exacerbation: Secondary | ICD-10-CM | POA: Diagnosis not present

## 2015-12-10 DIAGNOSIS — I872 Venous insufficiency (chronic) (peripheral): Secondary | ICD-10-CM | POA: Diagnosis not present

## 2015-12-10 DIAGNOSIS — E119 Type 2 diabetes mellitus without complications: Secondary | ICD-10-CM | POA: Diagnosis not present

## 2015-12-10 DIAGNOSIS — I2510 Atherosclerotic heart disease of native coronary artery without angina pectoris: Secondary | ICD-10-CM | POA: Diagnosis not present

## 2015-12-16 DIAGNOSIS — G2581 Restless legs syndrome: Secondary | ICD-10-CM | POA: Diagnosis not present

## 2015-12-16 DIAGNOSIS — J441 Chronic obstructive pulmonary disease with (acute) exacerbation: Secondary | ICD-10-CM | POA: Diagnosis not present

## 2015-12-16 DIAGNOSIS — I2510 Atherosclerotic heart disease of native coronary artery without angina pectoris: Secondary | ICD-10-CM | POA: Diagnosis not present

## 2015-12-16 DIAGNOSIS — M1991 Primary osteoarthritis, unspecified site: Secondary | ICD-10-CM | POA: Diagnosis not present

## 2015-12-16 DIAGNOSIS — I5030 Unspecified diastolic (congestive) heart failure: Secondary | ICD-10-CM | POA: Diagnosis not present

## 2015-12-16 DIAGNOSIS — E784 Other hyperlipidemia: Secondary | ICD-10-CM | POA: Diagnosis not present

## 2015-12-16 DIAGNOSIS — E119 Type 2 diabetes mellitus without complications: Secondary | ICD-10-CM | POA: Diagnosis not present

## 2015-12-16 DIAGNOSIS — Z794 Long term (current) use of insulin: Secondary | ICD-10-CM | POA: Diagnosis not present

## 2015-12-16 DIAGNOSIS — I872 Venous insufficiency (chronic) (peripheral): Secondary | ICD-10-CM | POA: Diagnosis not present

## 2015-12-16 DIAGNOSIS — I4891 Unspecified atrial fibrillation: Secondary | ICD-10-CM | POA: Diagnosis not present

## 2015-12-16 DIAGNOSIS — I110 Hypertensive heart disease with heart failure: Secondary | ICD-10-CM | POA: Diagnosis not present

## 2015-12-17 DIAGNOSIS — E784 Other hyperlipidemia: Secondary | ICD-10-CM | POA: Diagnosis not present

## 2015-12-17 DIAGNOSIS — I5030 Unspecified diastolic (congestive) heart failure: Secondary | ICD-10-CM | POA: Diagnosis not present

## 2015-12-17 DIAGNOSIS — G2581 Restless legs syndrome: Secondary | ICD-10-CM | POA: Diagnosis not present

## 2015-12-17 DIAGNOSIS — I872 Venous insufficiency (chronic) (peripheral): Secondary | ICD-10-CM | POA: Diagnosis not present

## 2015-12-17 DIAGNOSIS — M1991 Primary osteoarthritis, unspecified site: Secondary | ICD-10-CM | POA: Diagnosis not present

## 2015-12-17 DIAGNOSIS — I4891 Unspecified atrial fibrillation: Secondary | ICD-10-CM | POA: Diagnosis not present

## 2015-12-17 DIAGNOSIS — Z794 Long term (current) use of insulin: Secondary | ICD-10-CM | POA: Diagnosis not present

## 2015-12-17 DIAGNOSIS — E119 Type 2 diabetes mellitus without complications: Secondary | ICD-10-CM | POA: Diagnosis not present

## 2015-12-17 DIAGNOSIS — I110 Hypertensive heart disease with heart failure: Secondary | ICD-10-CM | POA: Diagnosis not present

## 2015-12-17 DIAGNOSIS — J441 Chronic obstructive pulmonary disease with (acute) exacerbation: Secondary | ICD-10-CM | POA: Diagnosis not present

## 2015-12-17 DIAGNOSIS — I2510 Atherosclerotic heart disease of native coronary artery without angina pectoris: Secondary | ICD-10-CM | POA: Diagnosis not present

## 2015-12-21 DIAGNOSIS — M1991 Primary osteoarthritis, unspecified site: Secondary | ICD-10-CM | POA: Diagnosis not present

## 2015-12-21 DIAGNOSIS — Z794 Long term (current) use of insulin: Secondary | ICD-10-CM | POA: Diagnosis not present

## 2015-12-21 DIAGNOSIS — G2581 Restless legs syndrome: Secondary | ICD-10-CM | POA: Diagnosis not present

## 2015-12-21 DIAGNOSIS — E119 Type 2 diabetes mellitus without complications: Secondary | ICD-10-CM | POA: Diagnosis not present

## 2015-12-21 DIAGNOSIS — J441 Chronic obstructive pulmonary disease with (acute) exacerbation: Secondary | ICD-10-CM | POA: Diagnosis not present

## 2015-12-21 DIAGNOSIS — I2510 Atherosclerotic heart disease of native coronary artery without angina pectoris: Secondary | ICD-10-CM | POA: Diagnosis not present

## 2015-12-21 DIAGNOSIS — I872 Venous insufficiency (chronic) (peripheral): Secondary | ICD-10-CM | POA: Diagnosis not present

## 2015-12-21 DIAGNOSIS — E784 Other hyperlipidemia: Secondary | ICD-10-CM | POA: Diagnosis not present

## 2015-12-21 DIAGNOSIS — I5030 Unspecified diastolic (congestive) heart failure: Secondary | ICD-10-CM | POA: Diagnosis not present

## 2015-12-21 DIAGNOSIS — I110 Hypertensive heart disease with heart failure: Secondary | ICD-10-CM | POA: Diagnosis not present

## 2015-12-21 DIAGNOSIS — I4891 Unspecified atrial fibrillation: Secondary | ICD-10-CM | POA: Diagnosis not present

## 2015-12-23 DIAGNOSIS — M1991 Primary osteoarthritis, unspecified site: Secondary | ICD-10-CM | POA: Diagnosis not present

## 2015-12-23 DIAGNOSIS — E784 Other hyperlipidemia: Secondary | ICD-10-CM | POA: Diagnosis not present

## 2015-12-23 DIAGNOSIS — J441 Chronic obstructive pulmonary disease with (acute) exacerbation: Secondary | ICD-10-CM | POA: Diagnosis not present

## 2015-12-23 DIAGNOSIS — Z794 Long term (current) use of insulin: Secondary | ICD-10-CM | POA: Diagnosis not present

## 2015-12-23 DIAGNOSIS — I4891 Unspecified atrial fibrillation: Secondary | ICD-10-CM | POA: Diagnosis not present

## 2015-12-23 DIAGNOSIS — E119 Type 2 diabetes mellitus without complications: Secondary | ICD-10-CM | POA: Diagnosis not present

## 2015-12-23 DIAGNOSIS — I5030 Unspecified diastolic (congestive) heart failure: Secondary | ICD-10-CM | POA: Diagnosis not present

## 2015-12-23 DIAGNOSIS — I2510 Atherosclerotic heart disease of native coronary artery without angina pectoris: Secondary | ICD-10-CM | POA: Diagnosis not present

## 2015-12-23 DIAGNOSIS — I872 Venous insufficiency (chronic) (peripheral): Secondary | ICD-10-CM | POA: Diagnosis not present

## 2015-12-23 DIAGNOSIS — G2581 Restless legs syndrome: Secondary | ICD-10-CM | POA: Diagnosis not present

## 2015-12-23 DIAGNOSIS — I110 Hypertensive heart disease with heart failure: Secondary | ICD-10-CM | POA: Diagnosis not present

## 2015-12-24 DIAGNOSIS — I872 Venous insufficiency (chronic) (peripheral): Secondary | ICD-10-CM | POA: Diagnosis not present

## 2015-12-24 DIAGNOSIS — M1991 Primary osteoarthritis, unspecified site: Secondary | ICD-10-CM | POA: Diagnosis not present

## 2015-12-24 DIAGNOSIS — E784 Other hyperlipidemia: Secondary | ICD-10-CM | POA: Diagnosis not present

## 2015-12-24 DIAGNOSIS — E119 Type 2 diabetes mellitus without complications: Secondary | ICD-10-CM | POA: Diagnosis not present

## 2015-12-24 DIAGNOSIS — I4891 Unspecified atrial fibrillation: Secondary | ICD-10-CM | POA: Diagnosis not present

## 2015-12-24 DIAGNOSIS — J441 Chronic obstructive pulmonary disease with (acute) exacerbation: Secondary | ICD-10-CM | POA: Diagnosis not present

## 2015-12-24 DIAGNOSIS — I5030 Unspecified diastolic (congestive) heart failure: Secondary | ICD-10-CM | POA: Diagnosis not present

## 2015-12-24 DIAGNOSIS — I2510 Atherosclerotic heart disease of native coronary artery without angina pectoris: Secondary | ICD-10-CM | POA: Diagnosis not present

## 2015-12-24 DIAGNOSIS — Z794 Long term (current) use of insulin: Secondary | ICD-10-CM | POA: Diagnosis not present

## 2015-12-24 DIAGNOSIS — G2581 Restless legs syndrome: Secondary | ICD-10-CM | POA: Diagnosis not present

## 2015-12-24 DIAGNOSIS — I110 Hypertensive heart disease with heart failure: Secondary | ICD-10-CM | POA: Diagnosis not present

## 2016-01-15 ENCOUNTER — Other Ambulatory Visit: Payer: Self-pay

## 2016-01-15 MED ORDER — FUROSEMIDE 80 MG PO TABS: 80.0000 mg | ORAL_TABLET | Freq: Two times a day (BID) | ORAL | 3 refills | Status: DC

## 2016-01-26 DIAGNOSIS — I872 Venous insufficiency (chronic) (peripheral): Secondary | ICD-10-CM | POA: Diagnosis not present

## 2016-01-26 DIAGNOSIS — I482 Chronic atrial fibrillation: Secondary | ICD-10-CM | POA: Diagnosis not present

## 2016-01-26 DIAGNOSIS — I10 Essential (primary) hypertension: Secondary | ICD-10-CM | POA: Diagnosis not present

## 2016-01-26 DIAGNOSIS — I350 Nonrheumatic aortic (valve) stenosis: Secondary | ICD-10-CM | POA: Diagnosis not present

## 2016-02-10 DIAGNOSIS — I350 Nonrheumatic aortic (valve) stenosis: Secondary | ICD-10-CM | POA: Diagnosis not present

## 2016-02-10 DIAGNOSIS — I110 Hypertensive heart disease with heart failure: Secondary | ICD-10-CM | POA: Diagnosis not present

## 2016-02-10 DIAGNOSIS — R600 Localized edema: Secondary | ICD-10-CM | POA: Diagnosis not present

## 2016-02-10 DIAGNOSIS — I10 Essential (primary) hypertension: Secondary | ICD-10-CM | POA: Diagnosis not present

## 2016-03-15 ENCOUNTER — Other Ambulatory Visit: Payer: Self-pay

## 2016-03-16 ENCOUNTER — Other Ambulatory Visit: Payer: Self-pay

## 2016-03-16 DIAGNOSIS — E119 Type 2 diabetes mellitus without complications: Secondary | ICD-10-CM

## 2016-03-16 DIAGNOSIS — Z0100 Encounter for examination of eyes and vision without abnormal findings: Principal | ICD-10-CM

## 2016-04-04 ENCOUNTER — Other Ambulatory Visit: Payer: Self-pay

## 2016-04-04 DIAGNOSIS — L608 Other nail disorders: Secondary | ICD-10-CM

## 2016-04-04 DIAGNOSIS — L03032 Cellulitis of left toe: Secondary | ICD-10-CM | POA: Diagnosis not present

## 2016-04-20 DIAGNOSIS — H353132 Nonexudative age-related macular degeneration, bilateral, intermediate dry stage: Secondary | ICD-10-CM | POA: Diagnosis not present

## 2016-08-22 ENCOUNTER — Other Ambulatory Visit: Payer: Self-pay

## 2016-08-22 DIAGNOSIS — S90822A Blister (nonthermal), left foot, initial encounter: Secondary | ICD-10-CM | POA: Diagnosis not present

## 2016-08-22 DIAGNOSIS — S90425A Blister (nonthermal), left lesser toe(s), initial encounter: Secondary | ICD-10-CM

## 2016-08-22 DIAGNOSIS — E1149 Type 2 diabetes mellitus with other diabetic neurological complication: Secondary | ICD-10-CM | POA: Diagnosis not present

## 2016-09-01 ENCOUNTER — Inpatient Hospital Stay
Admission: EM | Admit: 2016-09-01 | Discharge: 2016-09-04 | DRG: 637 | Disposition: A | Discharge status: Home Health | Payer: Medicare Other | Attending: Internal Medicine | Admitting: Internal Medicine

## 2016-09-01 ENCOUNTER — Emergency Department: Payer: Medicare Other

## 2016-09-01 ENCOUNTER — Emergency Department
Admission: EM | Admit: 2016-09-01 | Discharge: 2016-09-01 | Discharge status: Absent without leave | Payer: Self-pay | Attending: Student in an Organized Health Care Education/Training Program | Admitting: Student in an Organized Health Care Education/Training Program

## 2016-09-01 ENCOUNTER — Emergency Department: Payer: Self-pay

## 2016-09-01 DIAGNOSIS — Z823 Family history of stroke: Secondary | ICD-10-CM | POA: Diagnosis not present

## 2016-09-01 DIAGNOSIS — I959 Hypotension, unspecified: Secondary | ICD-10-CM | POA: Diagnosis present

## 2016-09-01 DIAGNOSIS — Z8249 Family history of ischemic heart disease and other diseases of the circulatory system: Secondary | ICD-10-CM | POA: Diagnosis not present

## 2016-09-01 DIAGNOSIS — N179 Acute kidney failure, unspecified: Secondary | ICD-10-CM | POA: Diagnosis present

## 2016-09-01 DIAGNOSIS — I110 Hypertensive heart disease with heart failure: Secondary | ICD-10-CM | POA: Diagnosis not present

## 2016-09-01 DIAGNOSIS — I248 Other forms of acute ischemic heart disease: Secondary | ICD-10-CM | POA: Diagnosis present

## 2016-09-01 DIAGNOSIS — M25512 Pain in left shoulder: Secondary | ICD-10-CM | POA: Diagnosis not present

## 2016-09-01 DIAGNOSIS — E785 Hyperlipidemia, unspecified: Secondary | ICD-10-CM | POA: Diagnosis not present

## 2016-09-01 DIAGNOSIS — Z8673 Personal history of transient ischemic attack (TIA), and cerebral infarction without residual deficits: Secondary | ICD-10-CM | POA: Diagnosis not present

## 2016-09-01 DIAGNOSIS — E1100 Type 2 diabetes mellitus with hyperosmolarity without nonketotic hyperglycemic-hyperosmolar coma (NKHHC): Principal | ICD-10-CM | POA: Diagnosis present

## 2016-09-01 DIAGNOSIS — A419 Sepsis, unspecified organism: Secondary | ICD-10-CM

## 2016-09-01 DIAGNOSIS — Z85828 Personal history of other malignant neoplasm of skin: Secondary | ICD-10-CM | POA: Diagnosis not present

## 2016-09-01 DIAGNOSIS — E872 Acidosis, unspecified: Secondary | ICD-10-CM

## 2016-09-01 DIAGNOSIS — I10 Essential (primary) hypertension: Secondary | ICD-10-CM | POA: Diagnosis present

## 2016-09-01 DIAGNOSIS — S79912A Unspecified injury of left hip, initial encounter: Secondary | ICD-10-CM | POA: Diagnosis not present

## 2016-09-01 DIAGNOSIS — E1165 Type 2 diabetes mellitus with hyperglycemia: Secondary | ICD-10-CM | POA: Diagnosis not present

## 2016-09-01 DIAGNOSIS — G9341 Metabolic encephalopathy: Secondary | ICD-10-CM | POA: Diagnosis not present

## 2016-09-01 DIAGNOSIS — E860 Dehydration: Secondary | ICD-10-CM | POA: Diagnosis present

## 2016-09-01 DIAGNOSIS — Z794 Long term (current) use of insulin: Secondary | ICD-10-CM | POA: Diagnosis not present

## 2016-09-01 DIAGNOSIS — Z7982 Long term (current) use of aspirin: Secondary | ICD-10-CM

## 2016-09-01 DIAGNOSIS — M25552 Pain in left hip: Secondary | ICD-10-CM | POA: Diagnosis not present

## 2016-09-01 DIAGNOSIS — R7989 Other specified abnormal findings of blood chemistry: Secondary | ICD-10-CM

## 2016-09-01 DIAGNOSIS — L8990 Pressure ulcer of unspecified site, unspecified stage: Secondary | ICD-10-CM | POA: Insufficient documentation

## 2016-09-01 DIAGNOSIS — G2581 Restless legs syndrome: Secondary | ICD-10-CM

## 2016-09-01 DIAGNOSIS — Z66 Do not resuscitate: Secondary | ICD-10-CM | POA: Diagnosis present

## 2016-09-01 DIAGNOSIS — Z79899 Other long term (current) drug therapy: Secondary | ICD-10-CM | POA: Diagnosis not present

## 2016-09-01 DIAGNOSIS — I2510 Atherosclerotic heart disease of native coronary artery without angina pectoris: Secondary | ICD-10-CM | POA: Diagnosis not present

## 2016-09-01 DIAGNOSIS — I509 Heart failure, unspecified: Secondary | ICD-10-CM | POA: Diagnosis not present

## 2016-09-01 DIAGNOSIS — R778 Other specified abnormalities of plasma proteins: Secondary | ICD-10-CM

## 2016-09-01 DIAGNOSIS — F17220 Nicotine dependence, chewing tobacco, uncomplicated: Secondary | ICD-10-CM | POA: Diagnosis present

## 2016-09-01 DIAGNOSIS — Z7951 Long term (current) use of inhaled steroids: Secondary | ICD-10-CM

## 2016-09-01 DIAGNOSIS — E871 Hypo-osmolality and hyponatremia: Secondary | ICD-10-CM | POA: Diagnosis not present

## 2016-09-01 DIAGNOSIS — R0600 Dyspnea, unspecified: Secondary | ICD-10-CM | POA: Diagnosis not present

## 2016-09-01 DIAGNOSIS — R739 Hyperglycemia, unspecified: Secondary | ICD-10-CM

## 2016-09-01 DIAGNOSIS — F0390 Unspecified dementia without behavioral disturbance: Secondary | ICD-10-CM | POA: Diagnosis present

## 2016-09-01 DIAGNOSIS — R4182 Altered mental status, unspecified: Secondary | ICD-10-CM | POA: Diagnosis not present

## 2016-09-01 DIAGNOSIS — R079 Chest pain, unspecified: Secondary | ICD-10-CM | POA: Diagnosis not present

## 2016-09-01 HISTORY — DX: Restless legs syndrome: G25.81

## 2016-09-01 LAB — URINALYSIS, COMPLETE (UACMP) WITH MICROSCOPIC
Bacteria, UA: NONE SEEN
Bilirubin Urine: NEGATIVE
Ketones, ur: NEGATIVE mg/dL
Leukocytes, UA: NEGATIVE
NITRITE: NEGATIVE
PROTEIN: NEGATIVE mg/dL
RBC / HPF: NONE SEEN RBC/hpf (ref 0–5)
Specific Gravity, Urine: 1.01 (ref 1.005–1.030)
Squamous Epithelial / LPF: NONE SEEN
WBC, UA: NONE SEEN WBC/hpf (ref 0–5)
pH: 5 (ref 5.0–8.0)

## 2016-09-01 LAB — COMPREHENSIVE METABOLIC PANEL
ALBUMIN: 4.5 g/dL (ref 3.5–5.0)
ALK PHOS: 113 U/L (ref 38–126)
ALT: 20 U/L (ref 17–63)
ANION GAP: 15 (ref 5–15)
AST: 41 U/L (ref 15–41)
BILIRUBIN TOTAL: 0.9 mg/dL (ref 0.3–1.2)
BUN: 78 mg/dL — ABNORMAL HIGH (ref 6–20)
CALCIUM: 9.6 mg/dL (ref 8.9–10.3)
CO2: 26 mmol/L (ref 22–32)
Chloride: 85 mmol/L — ABNORMAL LOW (ref 101–111)
Creatinine, Ser: 2.9 mg/dL — ABNORMAL HIGH (ref 0.61–1.24)
GFR calc Af Amer: 22 mL/min — ABNORMAL LOW (ref >60)
GFR, EST NON AFRICAN AMERICAN: 19 mL/min — AB (ref >60)
GLUCOSE: 832 mg/dL — AB (ref 65–99)
Potassium: 4.7 mmol/L (ref 3.5–5.1)
Sodium: 126 mmol/L — ABNORMAL LOW (ref 135–145)
TOTAL PROTEIN: 7.4 g/dL (ref 6.5–8.1)

## 2016-09-01 LAB — CBC WITH DIFFERENTIAL/PLATELET
BASOS ABS: 0 10*3/uL (ref 0–0.1)
BASOS PCT: 0 %
EOS ABS: 0.3 10*3/uL (ref 0–0.7)
Eosinophils Relative: 3 %
HEMATOCRIT: 37.9 % — AB (ref 40.0–52.0)
Hemoglobin: 12.8 g/dL — ABNORMAL LOW (ref 13.0–18.0)
Lymphocytes Relative: 5 %
Lymphs Abs: 0.5 10*3/uL — ABNORMAL LOW (ref 1.0–3.6)
MCH: 29.8 pg (ref 26.0–34.0)
MCHC: 33.8 g/dL (ref 32.0–36.0)
MCV: 88.1 fL (ref 80.0–100.0)
MONO ABS: 0.8 10*3/uL (ref 0.2–1.0)
Monocytes Relative: 7 %
NEUTROS PCT: 85 %
Neutro Abs: 9.1 10*3/uL — ABNORMAL HIGH (ref 1.4–6.5)
PLATELETS: 154 10*3/uL (ref 150–440)
RBC: 4.3 MIL/uL — ABNORMAL LOW (ref 4.40–5.90)
RDW: 15.1 % — AB (ref 11.5–14.5)
WBC: 10.8 10*3/uL — ABNORMAL HIGH (ref 3.8–10.6)

## 2016-09-01 LAB — TROPONIN I: Troponin I: 0.11 ng/mL (ref <0.03)

## 2016-09-01 LAB — GLUCOSE, CAPILLARY
GLUCOSE-CAPILLARY: 538 mg/dL — AB (ref 65–99)
GLUCOSE-CAPILLARY: 404 mg/dL — AB (ref 65–99)
Glucose-Capillary: >600 mg/dL (ref 65–99)

## 2016-09-01 LAB — LACTIC ACID, PLASMA
Lactic Acid, Venous: 3.1 mmol/L (ref 0.5–1.9)
Lactic Acid, Venous: 4.4 mmol/L (ref 0.5–1.9)

## 2016-09-01 LAB — OSMOLALITY: Osmolality: 332 mOsm/kg (ref 275–295)

## 2016-09-01 MED ORDER — SODIUM CHLORIDE 0.9 % IV BOLUS (SEPSIS)
1000.0000 mL | Freq: Once | INTRAVENOUS | Status: AC
  Administered 2016-09-01: 1000 mL via INTRAVENOUS

## 2016-09-01 MED ORDER — HEPARIN SODIUM (PORCINE) 5000 UNIT/ML IJ SOLN
5000.0000 [IU] | Freq: Three times a day (TID) | INTRAMUSCULAR | Status: DC
  Administered 2016-09-02 – 2016-09-04 (×6): 5000 [IU] via SUBCUTANEOUS
  Filled 2016-09-01 (×6): qty 1

## 2016-09-01 MED ORDER — SODIUM CHLORIDE 0.9 % IV SOLN
INTRAVENOUS | Status: DC
  Administered 2016-09-01: 4.8 [IU]/h via INTRAVENOUS
  Filled 2016-09-01: qty 2.5

## 2016-09-01 MED ORDER — ACETAMINOPHEN 650 MG RE SUPP: 650.0000 mg | Freq: Four times a day (QID) | RECTAL | Status: DC | PRN

## 2016-09-01 MED ORDER — SODIUM CHLORIDE 0.9 % IV BOLUS (SEPSIS)
1000.0000 mL | Freq: Once | INTRAVENOUS | Status: AC
Start: 2016-09-01 — End: 2016-09-01
  Administered 2016-09-01: 1000 mL via INTRAVENOUS

## 2016-09-01 MED ORDER — LIDOCAINE HCL (PF) 1 % IJ SOLN
INTRAMUSCULAR | Status: AC
  Administered 2016-09-01: 5 mL
  Filled 2016-09-01: qty 5

## 2016-09-01 MED ORDER — DEXTROSE 5 % IV SOLN
2.0000 g | INTRAVENOUS | Status: DC
  Filled 2016-09-01: qty 2

## 2016-09-01 MED ORDER — VANCOMYCIN HCL IN DEXTROSE 750-5 MG/150ML-% IV SOLN
750.0000 mg | INTRAVENOUS | Status: DC
  Filled 2016-09-01: qty 150

## 2016-09-01 MED ORDER — VANCOMYCIN HCL IN DEXTROSE 1-5 GM/200ML-% IV SOLN
1000.0000 mg | Freq: Once | INTRAVENOUS | Status: AC
  Administered 2016-09-01: 1000 mg via INTRAVENOUS
  Filled 2016-09-01: qty 200

## 2016-09-01 MED ORDER — DEXTROSE-NACL 5-0.45 % IV SOLN: INTRAVENOUS | Status: DC

## 2016-09-01 MED ORDER — INSULIN ASPART 100 UNIT/ML ~~LOC~~ SOLN
8.0000 [IU] | Freq: Once | SUBCUTANEOUS | Status: AC
  Administered 2016-09-01: 8 [IU] via INTRAVENOUS
  Filled 2016-09-01: qty 8

## 2016-09-01 MED ORDER — ATORVASTATIN CALCIUM 10 MG PO TABS
10.0000 mg | ORAL_TABLET | Freq: Every day | ORAL | Status: DC
  Administered 2016-09-02 – 2016-09-04 (×3): 10 mg via ORAL
  Filled 2016-09-01 (×3): qty 1

## 2016-09-01 MED ORDER — ASPIRIN EC 325 MG PO TBEC
325.0000 mg | DELAYED_RELEASE_TABLET | Freq: Every day | ORAL | Status: DC
  Administered 2016-09-02 – 2016-09-04 (×3): 325 mg via ORAL
  Filled 2016-09-01 (×3): qty 1

## 2016-09-01 MED ORDER — ACETAMINOPHEN 325 MG PO TABS
650.0000 mg | ORAL_TABLET | Freq: Four times a day (QID) | ORAL | Status: DC | PRN
  Administered 2016-09-02: 650 mg via ORAL
  Filled 2016-09-01: qty 2

## 2016-09-01 MED ORDER — ONDANSETRON HCL 4 MG/2ML IJ SOLN: 4.0000 mg | Freq: Four times a day (QID) | INTRAMUSCULAR | Status: DC | PRN

## 2016-09-01 MED ORDER — SODIUM CHLORIDE 0.9 % IV SOLN
INTRAVENOUS | Status: AC
  Administered 2016-09-02: 03:00:00 via INTRAVENOUS

## 2016-09-01 MED ORDER — SODIUM CHLORIDE 0.9 % IV SOLN
INTRAVENOUS | Status: DC
  Administered 2016-09-02: 4.5 [IU]/h via INTRAVENOUS

## 2016-09-01 MED ORDER — CEFEPIME HCL 2 G IJ SOLR
2.0000 g | Freq: Once | INTRAMUSCULAR | Status: AC
  Administered 2016-09-01 (×2): 2 g via INTRAVENOUS
  Filled 2016-09-01: qty 2

## 2016-09-01 MED ORDER — NOREPINEPHRINE BITARTRATE 1 MG/ML IV SOLN
0.0000 ug/min | Freq: Once | INTRAVENOUS | Status: AC
  Administered 2016-09-01: 2 ug/min via INTRAVENOUS
  Filled 2016-09-01: qty 4

## 2016-09-01 MED ORDER — ONDANSETRON HCL 4 MG PO TABS: 4.0000 mg | ORAL_TABLET | Freq: Four times a day (QID) | ORAL | Status: DC | PRN

## 2016-09-01 NOTE — ED Notes (Signed)
Called pharmacy for Levophed order, states they will send it to ED.

## 2016-09-01 NOTE — ED Notes (Signed)
Pt's oxygen saturation dropped to 82% on room air, pt is currently trying to sleep. 2L of oxygen applied to pt, oxygen saturation rose to 95%.

## 2016-09-01 NOTE — ED Triage Notes (Signed)
Patient from home with complaint of AMS. Patient had change in mental status yesterday afternoon. BS was around 600. Patient was sluggish and confused. Patient got better. Then earlier this morning patient wouldn't answer the phone. Family went to check on patient and found him on the floor. Patient couldn't tell family when or how he fell. Patient didn't take his insulin this am or any morning pills. Patient hasn't eaten today. BS registered high and family gave 30 units of [place] about 1:43pm and morning medications. Patient did eat crackers. Patient didn't get better so family brought into ED. Patient incontinent at this time with little output. Patient weak.

## 2016-09-01 NOTE — ED Notes (Signed)
Confusion, weakness started yesterday, worsing today , CBG ready high , hypotension

## 2016-09-01 NOTE — ED Notes (Signed)
Pt more verbal at this time, oriented x 4 but still appears somewhat lethargic.

## 2016-09-01 NOTE — ED Provider Notes (Signed)
[facility] Emergency Department Provider Note    First MD Initiated Contact with Patient 09/01/16 1913     (approximate)  I have reviewed the triage vital signs and the nursing notes.   HISTORY  Chief Complaint Altered Mental Status    HPI [name] Good is a 83 y.o. male this with chief complaint of altered mental status over the past 24 hours. Family at bedside who is also patient's caregiver noted that his blood sugars were increasing to around 600 yesterday. She did give him insulin. Patient woke this morning drowsy and "sluggish ". Patient had decreased oral intake in eating throughout the day. She called to check on him this morning but he did not answer. When she went to check on him they found him on the floor. Patient did not take any of his morning medications. She did give him 30 units of Lantus around 2 PM. This patient did not improve throughout the afternoon she brought him in the ER for further evaluation.   Past Medical History:  Diagnosis Date  . Arthritis   . CAD (coronary artery disease)   . Cancer ([place])   . CHF (congestive heart failure) ([place])   . Diabetes mellitus without complication ([place])   . Hyperlipidemia   . Hypertension   . Stroke ([place])   . Venous insufficiency    No family history on file. Past Surgical History:  Procedure Laterality Date  . BACK SURGERY    . COLONOSCOPY W/ BIOPSIES AND MANOMETRY CATHETER PLACEMENT  2014   normal  . EYE SURGERY Bilateral    cataract  . HERNIA REPAIR    . SHOULDER SURGERY Right   . SKIN CANCER EXCISION     nose, ear, arm, and face   Patient Active Problem List   Diagnosis Date Noted  . Septic shock ([place]) 10/05/2015  . Aspiration pneumonia of right lower lobe ([place])   . Acute on chronic respiratory failure with hypoxia and hypercapnia (HCC)   . Abdominal pain, acute, left upper quadrant 02/02/2015      Prior to Admission medications   Medication Sig Start Date End Date  Taking? Authorizing Provider  aspirin EC 325 MG tablet Take 325 mg by mouth daily.    Historical Provider, MD  atorvastatin (LIPITOR) 20 MG tablet Take 10 mg by mouth daily.    Historical Provider, MD  budesonide (PULMICORT) 0.25 MG/2ML nebulizer solution Take 2 mLs (0.25 mg total) by nebulization 2 (two) times daily. Patient not taking: Reported on 10/30/2015 10/11/15   Loletha Grayer, MD  diltiazem (CARDIZEM CD) 180 MG 24 hr capsule Take 1 capsule (180 mg total) by mouth daily. 10/09/15   Henreitta Leber, MD  docusate sodium (COLACE) 100 MG capsule Take 100 mg by mouth daily.    Historical Provider, MD  donepezil (ARICEPT) 10 MG tablet Take 10 mg by mouth at bedtime.    Historical Provider, MD  doxazosin (CARDURA) 4 MG tablet Take 4 mg by mouth at bedtime.     Historical Provider, MD  furosemide (LASIX) 80 MG tablet Take 1-2 tablets (80-160 mg total) by mouth 2 (two) times daily. Take 80mg  twice daily on Sunday, Monday, Wednesday, Friday, and Saturday. Take 160mg  twice daily on Tuesday and Thursday. 01/15/16   Juline Patch, MD  insulin glargine (LANTUS) 100 UNIT/ML injection Inject 20 Units into the skin every morning. Dr Enzo Montgomery    Historical Provider, MD  insulin regular (NOVOLIN R,HUMULIN R) 100 units/mL injection Inject  0-12 Units into the skin 3 (three) times daily before meals. Reported on 10/30/2015    Historical Provider, MD  ipratropium-albuterol (DUONEB) 0.5-2.5 (3) MG/3ML SOLN Take 3 mLs by nebulization every 6 (six) hours. Patient not taking: Reported on 10/30/2015 10/11/15   Loletha Grayer, MD  losartan (COZAAR) 50 MG tablet Take 50 mg by mouth daily.    Historical Provider, MD  Melatonin 3 MG TABS Take 3 mg by mouth at bedtime.    Historical Provider, MD  meloxicam (MOBIC) 15 MG tablet Take 7.5 mg by mouth daily.    Historical Provider, MD  metFORMIN (GLUCOPHAGE) 500 MG tablet Take 500 mg by mouth 2 (two) times daily with a meal.    Historical Provider, MD  pramipexole (MIRAPEX) 1 MG  tablet Take 1.5 tablets (1.5 mg total) by mouth at bedtime. Dr Enzo Montgomery 10/11/15   Loletha Grayer, MD  predniSONE (DELTASONE) 10 MG tablet Take 2 tabs po daily day1,2; 1 tab daily day3,4; 1/2 tab daily day5,6 Patient not taking: Reported on 10/30/2015 10/11/15   Loletha Grayer, MD  rOPINIRole (REQUIP) 2 MG tablet Take 2 mg by mouth at bedtime.    Historical Provider, MD  spironolactone (ALDACTONE) 25 MG tablet Take 25 mg by mouth daily.     Historical Provider, MD    Allergies Biaxin [clarithromycin]    Social History Social History  Substance Use Topics  . Smoking status: Former Research scientist (life sciences)  . Smokeless tobacco: Current User    Types: Chew  . Alcohol use No    Review of Systems Patient denies headaches, rhinorrhea, blurry vision, numbness, shortness of breath, chest pain, edema, cough, abdominal pain, nausea, vomiting, diarrhea, dysuria, fevers, rashes or hallucinations unless otherwise stated above in HPI. ____________________________________________   PHYSICAL EXAM:  VITAL SIGNS: Vitals:   09/01/16 2233 09/01/16 2300  BP: (!) 95/56 (!) 87/52  Pulse: (!) 58 (!) 57  Resp: 14 15  Temp:      Constitutional: Alert but very ill appearing  Eyes: Conjunctivae are normal. PERRL. EOMI. Head: Atraumatic. Nose: No congestion/rhinnorhea. Mouth/Throat: Mucous membranes are dry.  Oropharynx non-erythematous. Neck: No stridor. Painless ROM. No cervical spine tenderness to palpation Hematological/Lymphatic/Immunilogical: No cervical lymphadenopathy. Cardiovascular: Normal rate, regular rhythm. Holosystolic murmur., no rubs Respiratory: Normal respiratory effort.  No retractions. Lungs CTAB. Gastrointestinal: Soft and nontender. No distention. No abdominal bruits. No CVA tenderness. Genitourinary: normal external genitalia Musculoskeletal: No lower extremity tenderness nor edema.  No joint effusions. Neurologic:  Normal speech and language. No facial droop Skin:  Skin is warm, dry and  intact. No rash noted.   ____________________________________________   LABS (all labs ordered are listed, but only abnormal results are displayed)  Results for orders placed or performed during the hospital encounter of 09/01/16 (from the past 24 hour(s))  Glucose, capillary     Status: Abnormal   Collection Time: 09/01/16  7:14 PM  Result Value Ref Range   Glucose-Capillary >600 (HH) 65 - 99 mg/dL  Comprehensive metabolic panel     Status: Abnormal   Collection Time: 09/01/16  7:30 PM  Result Value Ref Range   Sodium 126 (L) 135 - 145 mmol/L   Potassium 4.7 3.5 - 5.1 mmol/L   Chloride 85 (L) 101 - 111 mmol/L   CO2 26 22 - 32 mmol/L   Glucose, Bld 832 (HH) 65 - 99 mg/dL   BUN 78 (H) 6 - 20 mg/dL   Creatinine, Ser 2.90 (H) 0.61 - 1.24 mg/dL   Calcium 9.6 8.9 - 10.3  mg/dL   Total Protein 7.4 6.5 - 8.1 g/dL   Albumin 4.5 3.5 - 5.0 g/dL   AST 41 15 - 41 U/L   ALT 20 17 - 63 U/L   Alkaline Phosphatase 113 38 - 126 U/L   Total Bilirubin 0.9 0.3 - 1.2 mg/dL   GFR calc non Af Amer 19 (L) >60 mL/min   GFR calc Af Amer 22 (L) >60 mL/min   Anion gap 15 5 - 15  Troponin I     Status: Abnormal   Collection Time: 09/01/16  7:30 PM  Result Value Ref Range   Troponin I 0.11 (HH) <0.03 ng/mL  CBC WITH DIFFERENTIAL     Status: Abnormal   Collection Time: 09/01/16  7:30 PM  Result Value Ref Range   WBC 10.8 (H) 3.8 - 10.6 K/uL   RBC 4.30 (L) 4.40 - 5.90 MIL/uL   Hemoglobin 12.8 (L) 13.0 - 18.0 g/dL   HCT 37.9 (L) 40.0 - 52.0 %   MCV 88.1 80.0 - 100.0 fL   MCH 29.8 26.0 - 34.0 pg   MCHC 33.8 32.0 - 36.0 g/dL   RDW 15.1 (H) 11.5 - 14.5 %   Platelets 154 150 - 440 K/uL   Neutrophils Relative % 85 %   Neutro Abs 9.1 (H) 1.4 - 6.5 K/uL   Lymphocytes Relative 5 %   Lymphs Abs 0.5 (L) 1.0 - 3.6 K/uL   Monocytes Relative 7 %   Monocytes Absolute 0.8 0.2 - 1.0 K/uL   Eosinophils Relative 3 %   Eosinophils Absolute 0.3 0 - 0.7 K/uL   Basophils Relative 0 %   Basophils Absolute 0.0 0 -  0.1 K/uL  Osmolality     Status: Abnormal   Collection Time: 09/01/16  7:30 PM  Result Value Ref Range   Osmolality 332 (HH) 275 - 295 mOsm/kg  Lactic acid, plasma     Status: Abnormal   Collection Time: 09/01/16  8:04 PM  Result Value Ref Range   Lactic Acid, Venous 3.1 (HH) 0.5 - 1.9 mmol/L  Blood gas, venous ([facility], [facility], [facility])     Status: Abnormal   Collection Time: 09/01/16  8:04 PM  Result Value Ref Range   pH, Ven 7.30 7.250 - 7.430   pCO2, Ven 61 (H) 44.0 - 60.0 mmHg   pO2, Ven PENDING 32.0 - 45.0 mmHg   Bicarbonate 30.0 (H) 20.0 - 28.0 mmol/L   Acid-Base Excess 2.0 0.0 - 2.0 mmol/L   Patient temperature 37.0    Collection site RIGHT ANTECUBITAL    Sample type VENOUS   Urinalysis, Complete w Microscopic     Status: Abnormal   Collection Time: 09/01/16  8:22 PM  Result Value Ref Range   Color, Urine YELLOW (A) YELLOW   APPearance CLEAR (A) CLEAR   Specific Gravity, Urine 1.010 1.005 - 1.030   pH 5.0 5.0 - 8.0   Glucose, UA >=500 (A) NEGATIVE mg/dL   Hgb urine dipstick SMALL (A) NEGATIVE   Bilirubin Urine NEGATIVE NEGATIVE   Ketones, ur NEGATIVE NEGATIVE mg/dL   Protein, ur NEGATIVE NEGATIVE mg/dL   Nitrite NEGATIVE NEGATIVE   Leukocytes, UA NEGATIVE NEGATIVE   RBC / HPF NONE SEEN 0 - 5 RBC/hpf   WBC, UA NONE SEEN 0 - 5 WBC/hpf   Bacteria, UA NONE SEEN NONE SEEN   Squamous Epithelial / LPF NONE SEEN NONE SEEN   Hyaline Casts, UA PRESENT   Glucose, capillary     Status: Abnormal  Collection Time: 09/01/16  9:05 PM  Result Value Ref Range   Glucose-Capillary >600 (HH) 65 - 99 mg/dL  Glucose, capillary     Status: Abnormal   Collection Time: 09/01/16  9:48 PM  Result Value Ref Range   Glucose-Capillary 538 (HH) 65 - 99 mg/dL  Glucose, capillary     Status: Abnormal   Collection Time: 09/01/16 10:51 PM  Result Value Ref Range   Glucose-Capillary 404 (H) 65 - 99 mg/dL   ____________________________________________  EKG My review and personal interpretation at Time:  19:11   Indication: ams  Rate: 60  Rhythm: afib Axis: normal Other: inferolateral st depressions, no STEMI ____________________________________________  RADIOLOGY I personally reviewed all radiographic images ordered to evaluate for the above acute complaints and reviewed radiology reports and findings.  These findings were personally discussed with the patient.  Please see medical record for radiology report.  ____________________________________________   PROCEDURES  Procedure(s) performed:  Procedures    Critical Care performed: yes CRITICAL CARE Performed by: Merlyn Lot   Total critical care time: 50 minutes  Critical care time was exclusive of separately billable procedures and treating other patients.  Critical care was necessary to treat or prevent imminent or life-threatening deterioration.  Critical care was time spent personally by me on the following activities: development of treatment plan with patient and/or surrogate as well as nursing, discussions with consultants, evaluation of patient's response to treatment, examination of patient, obtaining history from patient or surrogate, ordering and performing treatments and interventions, ordering and review of laboratory studies, ordering and review of radiographic studies, pulse oximetry and re-evaluation of patient's condition.  ____________________________________________   INITIAL IMPRESSION / ASSESSMENT AND PLAN / ED COURSE  Pertinent labs & imaging results that were available during my care of the patient were reviewed by me and considered in my medical decision making (see chart for details).  DDX: Dehydration, sepsis, pna, uti, hypoglycemia, cva, drug effect, withdrawal, encephalitis   [name] Good is a 83 y.o. who presents to the ED with altered mental status and very ill-appearing as described above. Blood work is concerning for DKA versus hyperosmolar nonketotic glycemia.  Also concern for  sepsis. We'll provide IV fluids as the patient does appear profoundly dehydrated  CT head ordered due to concern for ams and ffs.  The patient will be placed on continuous pulse oximetry and telemetry for monitoring.  Laboratory evaluation will be sent to evaluate for the above complaints.     Clinical Course as of Sep 01 2305  Thu Sep 01, 2016  2037 No significant acidosis.  Troponin elevation noted.  EKG changes are concerning for NSTEMI.  Patient reiterates that he does not have any chest pain or sob.  Has not had these symptoms recently.  Likely demand ischemia 2/2 what I expect is sepsis versus profound dehydration or hhs  [PR]  2151 Spoke with Dr. Jannifer Franklin who kindly agrees to admit patient for further evaluation and management.  Have discussed with the patient and available family all diagnostics and treatments performed thus far and all questions were answered to the best of my ability. The patient demonstrates understanding and agreement with plan.   [PR]  2306 Cauda bedside for patient with decreasing blood pressure. Started additional IV fluids. Patient still mentating but maps in the high 50s to low 60s. Obtain better IV access with multiple report left EJ under ultrasound guidance. We'll start nor epi.  [PR]    Clinical Course User Index [PR] Merlyn Lot,  MD     ____________________________________________   FINAL CLINICAL IMPRESSION(S) / ED DIAGNOSES  Final diagnoses:  Acute metabolic encephalopathy  Acute hyponatremia  Hyperglycemia without ketosis  Elevated troponin I level  Lactic acidosis  Sepsis, due to unspecified organism [facility])      NEW MEDICATIONS STARTED DURING THIS VISIT:  New Prescriptions   No medications on file     Note:  This document was prepared using Dragon voice recognition software and may include unintentional dictation errors.    Merlyn Lot, MD 09/01/16 [phone]

## 2016-09-01 NOTE — H&P (Signed)
[name]    MR#:  [id number]  DATE OF BIRTH:  [birth year]  DATE OF ADMISSION:  09/01/2016  PRIMARY CARE PHYSICIAN: Otilio Miu, MD   REQUESTING/REFERRING PHYSICIAN: Quentin Cornwall, MD  CHIEF COMPLAINT:   Chief Complaint  Patient presents with  . Altered Mental Status    HISTORY OF PRESENT ILLNESS:  [name] Good  is a 83 y.o. male who presents with 2 days of progressive confusion. Patient is unable to contribute much to his history of present illness. His daughter states that on the day prior to admission he was significantly confused and his blood sugars were very elevated. Patient not taking his medicines as he normally does, which is very odd for him per his daughter's report. On the day of admission he was again somewhat confused and did not take his medicines, and in the afternoon when failure went to see him they found him unresponsive on the floor. Here in the ED was found to have a blood glucose in the 800s, he is profoundly dehydrated, and does need SIRS criteria though no focus of infection was identified on initial workup. Hospitalists were called for admission and further evaluation and treatment.  PAST MEDICAL HISTORY:   Past Medical History:  Diagnosis Date  . Arthritis   . CAD (coronary artery disease)   . Cancer ([place])   . CHF (congestive heart failure) ([place])   . Diabetes mellitus without complication ([place])   . Hyperlipidemia   . Hypertension   . Restless leg 09/01/2016  . Stroke ([place])   . Venous insufficiency     PAST SURGICAL HISTORY:   Past Surgical History:  Procedure Laterality Date  . BACK SURGERY    . COLONOSCOPY W/ BIOPSIES AND MANOMETRY CATHETER PLACEMENT  2014   normal  . EYE SURGERY Bilateral    cataract  . HERNIA REPAIR    . SHOULDER SURGERY Right   . SKIN CANCER EXCISION     nose, ear, arm, and face    SOCIAL HISTORY:   Social History  Substance Use Topics  .  Smoking status: Former Research scientist (life sciences)  . Smokeless tobacco: Current User    Types: Chew  . Alcohol use No    FAMILY HISTORY:   Family History  Problem Relation Age of Onset  . Heart disease Father   . Hypertension Father   . Stroke Father     DRUG ALLERGIES:   Allergies  Allergen Reactions  . Biaxin [Clarithromycin] Nausea Only    MEDICATIONS AT HOME:   Prior to Admission medications   Medication Sig Start Date End Date Taking? Authorizing Provider  aspirin EC 325 MG tablet Take 325 mg by mouth daily.   Yes Historical Provider, MD  atorvastatin (LIPITOR) 20 MG tablet Take 10 mg by mouth daily.   Yes Historical Provider, MD  diltiazem (CARDIZEM CD) 180 MG 24 hr capsule Take 1 capsule (180 mg total) by mouth daily. 10/09/15  Yes Henreitta Leber, MD  docusate sodium (COLACE) 100 MG capsule Take 100 mg by mouth daily.   Yes Historical Provider, MD  donepezil (ARICEPT) 10 MG tablet Take 10 mg by mouth at bedtime.   Yes Historical Provider, MD  doxazosin (CARDURA) 4 MG tablet Take 4 mg by mouth at bedtime.    Yes Historical Provider, MD  furosemide (LASIX) 80 MG tablet Take 1-2 tablets (80-160 mg total) by mouth 2 (two) times daily. Take 80mg  twice daily on  Sunday, Monday, Wednesday, Friday, and Saturday. Take 160mg  twice daily on Tuesday and Thursday. 01/15/16  Yes Juline Patch, MD  insulin glargine (LANTUS) 100 UNIT/ML injection Inject 20 Units into the skin every morning. Dr Enzo Montgomery   Yes Historical Provider, MD  losartan (COZAAR) 50 MG tablet Take 50 mg by mouth daily.   Yes Historical Provider, MD  Melatonin 3 MG TABS Take 3 mg by mouth at bedtime.   Yes Historical Provider, MD  meloxicam (MOBIC) 15 MG tablet Take 7.5 mg by mouth daily.   Yes Historical Provider, MD  metFORMIN (GLUCOPHAGE) 500 MG tablet Take 500 mg by mouth 2 (two) times daily with a meal.   Yes Historical Provider, MD  pramipexole (MIRAPEX) 1 MG tablet Take 1.5 tablets (1.5 mg total) by mouth at bedtime. Dr Enzo Montgomery 10/11/15   Yes Loletha Grayer, MD  rOPINIRole (REQUIP) 2 MG tablet Take 2 mg by mouth at bedtime.   Yes Historical Provider, MD  spironolactone (ALDACTONE) 25 MG tablet Take 25 mg by mouth daily.    Yes Historical Provider, MD  budesonide (PULMICORT) 0.25 MG/2ML nebulizer solution Take 2 mLs (0.25 mg total) by nebulization 2 (two) times daily. Patient not taking: Reported on 10/30/2015 10/11/15   Loletha Grayer, MD  ipratropium-albuterol (DUONEB) 0.5-2.5 (3) MG/3ML SOLN Take 3 mLs by nebulization every 6 (six) hours. Patient not taking: Reported on 10/30/2015 10/11/15   Loletha Grayer, MD  predniSONE (DELTASONE) 10 MG tablet Take 2 tabs po daily day1,2; 1 tab daily day3,4; 1/2 tab daily day5,6 Patient not taking: Reported on 10/30/2015 10/11/15   Loletha Grayer, MD    REVIEW OF SYSTEMS:  Review of Systems  Unable to perform ROS: Acuity of condition     VITAL SIGNS:   Vitals:   09/01/16 1923 09/01/16 2030 09/01/16 2100 09/01/16 2148  BP:  (!) 103/55 (!) 106/53 (!) 90/43  Pulse:  (!) 57 60 60  Resp:  18 16 17   Temp:      TempSrc:      SpO2:  94% 96% 92%  Weight: 68 kg (150 lb)     Height: 5\' 9"  (1.753 m)      Wt Readings from Last 3 Encounters:  09/01/16 68 kg (150 lb)  10/11/15 79 kg (174 lb 3.2 oz)  02/26/15 72.1 kg (159 lb)    PHYSICAL EXAMINATION:  Physical Exam  Vitals reviewed. Constitutional: He appears well-developed and well-nourished. No distress.  HENT:  Head: Normocephalic and atraumatic.  Mouth/Throat: Oropharynx is clear and moist.  Eyes: Conjunctivae and EOM are normal. Pupils are equal, round, and reactive to light. No scleral icterus.  Neck: Normal range of motion. Neck supple. No JVD present. No thyromegaly present.  Cardiovascular: Normal rate, regular rhythm and intact distal pulses.  Exam reveals no gallop and no friction rub.   No murmur heard. Respiratory: Effort normal and breath sounds normal. No respiratory distress. He has no wheezes. He has no rales.  GI:  Soft. Bowel sounds are normal. He exhibits no distension. There is no tenderness.  Musculoskeletal: Normal range of motion. He exhibits no edema.  No arthritis, no gout  Lymphadenopathy:    He has no cervical adenopathy.  Neurological: No cranial nerve deficit.  Unable to fully assess due to patient condition  Skin: Skin is warm and dry. No rash noted. No erythema.  Psychiatric:  Unable to assess due to patient condition    LABORATORY PANEL:   CBC  Recent Labs Lab 09/01/16 1930  WBC  10.8*  HGB 12.8*  HCT 37.9*  PLT 154   ------------------------------------------------------------------------------------------------------------------  Chemistries   Recent Labs Lab 09/01/16 1930  NA 126*  K 4.7  CL 85*  CO2 26  GLUCOSE 832*  BUN 78*  CREATININE 2.90*  CALCIUM 9.6  AST 41  ALT 20  ALKPHOS 113  BILITOT 0.9   ------------------------------------------------------------------------------------------------------------------  Cardiac Enzymes  Recent Labs Lab 09/01/16 1930  TROPONINI 0.11*   ------------------------------------------------------------------------------------------------------------------  RADIOLOGY:  Ct Head Wo Contrast  Result Date: 09/01/2016 CLINICAL DATA:  Acute onset of altered mental status. Confusion and lethargy. Found on floor. Generalized weakness. Initial encounter. EXAM: CT HEAD WITHOUT CONTRAST TECHNIQUE: Contiguous axial images were obtained from the base of the skull through the vertex without intravenous contrast. COMPARISON:  CT of the head performed 02/07/2009 FINDINGS: Brain: No evidence of acute infarction, hemorrhage, hydrocephalus, extra-axial collection or mass lesion/mass effect. Prominence of the ventricles sulci reflects moderate cortical volume loss. Cerebellar atrophy is noted. Scattered periventricular and subcortical white matter change likely reflects small vessel ischemic microangiopathy. A chronic infarct is noted at  the right basal ganglia, and a chronic lacunar infarct is seen at the left thalamus. The brainstem and fourth ventricle are within normal limits. The cerebral hemispheres demonstrate grossly normal gray-white differentiation. No mass effect or midline shift is seen. Vascular: No hyperdense vessel or unexpected calcification. Skull: There is no evidence of fracture; visualized osseous structures are unremarkable in appearance. Sinuses/Orbits: The orbits are within normal limits. The paranasal sinuses and mastoid air cells are well-aerated. Other: No significant soft tissue abnormalities are seen. IMPRESSION: 1. No acute intracranial pathology seen on CT. 2. Moderate cortical volume loss and scattered small vessel ischemic microangiopathy. 3. Chronic infarct at the right basal ganglia, and chronic lacunar infarct at the left thalamus. Electronically Signed   By: Garald Balding M.D.   On: 09/01/2016 19:57   Dg Chest Port 1 View  Result Date: 09/01/2016 CLINICAL DATA:  Chest pain EXAM: PORTABLE CHEST 1 VIEW COMPARISON:  10/09/2015 FINDINGS: Stable cardiomegaly with central congestion. No consolidation. No effusion. Atherosclerosis. No pneumothorax. IMPRESSION: Cardiomegaly.  No infiltrate or edema. Electronically Signed   By: Donavan Foil M.D.   On: 09/01/2016 20:01   Dg Shoulder Left  Result Date: 09/01/2016 CLINICAL DATA:  Acute pain EXAM: LEFT SHOULDER - 2+ VIEW COMPARISON:  None. FINDINGS: Oblique, Y scapular, and axillary images were obtained. There is generalized osteoporosis. There is moderate generalized osteoarthritic change. No fracture or dislocation. There is calcification lateral to the left humeral head. There are foci of arterial vascular calcification in the axillary artery on the left. The visualized left lung is clear. There is aortic atherosclerosis. IMPRESSION: No acute fracture or dislocation osteoporosis with moderate generalized osteoarthritic change. Calcification lateral to the humeral  head likely is due to calcific tendinosis. There is aortic atherosclerosis as well as left axillary artery atherosclerotic calcification. Electronically Signed   By: Lowella Grip III M.D.   On: 09/01/2016 20:00   Dg Hip Unilat W Or Wo Pelvis 2-3 Views Left  Result Date: 09/01/2016 CLINICAL DATA:  Status post fall, with left hip pain. Initial encounter. EXAM: DG HIP (WITH OR WITHOUT PELVIS) 2-3V LEFT COMPARISON:  None. FINDINGS: There is no evidence of fracture or dislocation. Both femoral heads are seated normally within their respective acetabula. The proximal left femur appears intact. No significant degenerative change is appreciated. The sacroiliac joints are unremarkable in appearance. The visualized bowel gas pattern is grossly unremarkable in appearance. Scattered vascular  calcifications are seen. A right-sided mesh is noted along the pelvis. IMPRESSION: No evidence of fracture or dislocation. Electronically Signed   By: Garald Balding M.D.   On: 09/01/2016 21:55    EKG:   Orders placed or performed during the hospital encounter of 09/01/16  . EKG 12-Lead  . EKG 12-Lead  . ED EKG 12-Lead  . ED EKG 12-Lead    IMPRESSION AND PLAN:  Principal Problem:   Diabetic hyperosmolar non-ketotic state ([place]) - admit to stepdown unit with IV insulin, aggressive hydration Active Problems:   Lactic acidosis - unclear if this is due to sepsis, focus of infection has not been elucidated this point, we are covering with broad-spectrum antibiotics and have sent cultures   AKI (acute kidney injury) ([place]) - likely due to his above problems, aggressive hydration, avoid nephrotoxins and monitor for improvement   HTN (hypertension) - patient is hypotensive at this time, we will hold antihypertensives for now   CAD (coronary artery disease) - continue home meds   HLD (hyperlipidemia) - continue home meds  All the records are reviewed and case discussed with ED provider. Management plans discussed with  the patient and/or family.  DVT PROPHYLAXIS: SubQ Heparin  GI PROPHYLAXIS: None  ADMISSION STATUS: Inpatient  CODE STATUS: DNR    Code Status Orders        Start     Ordered   09/01/16 2121  Do not attempt resuscitation/DNR  Continuous    Question Answer Comment  In the event of cardiac or respiratory ARREST Do not call a "code blue"   In the event of cardiac or respiratory ARREST Do not perform Intubation, CPR, defibrillation or ACLS   In the event of cardiac or respiratory ARREST Use medication by any route, position, wound care, and other measures to relive pain and suffering. May use oxygen, suction and manual treatment of airway obstruction as needed for comfort.      09/01/16 2120    Code Status History    Date Active Date Inactive Code Status Order ID Comments User Context   10/05/2015 12:08 AM 10/11/2015  2:18 PM Full Code [id number]  Mikael Spray, NP ED    Advance Directive Documentation     Most Recent Value  Type of Advance Directive  Living will, Healthcare Power of Attorney  Pre-existing out of facility DNR order (yellow form or pink MOST form)  -  "MOST" Form in Place?  -    DNR  TOTAL TIME TAKING CARE OF THIS PATIENT: 45 minutes.    Miliana Gangwer [place] 09/01/2016, 10:28 PM  [facility]  Office  [phone]  CC: Primary care physician; Otilio Miu, MD

## 2016-09-01 NOTE — Progress Notes (Signed)
Pharmacy Antibiotic Note  [name] Good is a 83 y.o. male admitted on 09/01/2016 with sepsis.  Pharmacy has been consulted for vancomycin and cefepime dosing.  Plan: DW 68kg  Vd 48L kei 0.02 hr-1  t1/2 35 hours Vancomycin 750 mg q 36 hours ordered with stacked dosing. Level before 4th dose. Goal trough 15-20.  Cefepime 2 grams  q 24 hours ordered.   Height: 5\' 9"  (175.3 cm) Weight: 150 lb (68 kg) IBW/kg (Calculated) : 70.7  Temp (24hrs), Avg:97.5 F (36.4 C), Min:97.5 F (36.4 C), Max:97.5 F (36.4 C)   Recent Labs Lab 09/01/16 1930 09/01/16 2004 09/01/16 2232  WBC 10.8*  --   --   CREATININE 2.90*  --   --   LATICACIDVEN  --  3.1* 4.4*    Estimated Creatinine Clearance: 18.6 mL/min (A) (by C-G formula based on SCr of 2.9 mg/dL (H)).    Allergies  Allergen Reactions  . Biaxin [Clarithromycin] Nausea Only    Antimicrobials this admission: Vancomycin, cefepime  >>    >>   Dose adjustments this admission:   Microbiology results: 3/22 BCx: pending 3/22 UCx: pendng      3/22 UA: (-) 3/22 CXR: no infiltrate  Thank you for allowing pharmacy to be a part of this patient's care.  Darinda [place] S 09/01/2016 11:51 PM

## 2016-09-02 DIAGNOSIS — E1100 Type 2 diabetes mellitus with hyperosmolarity without nonketotic hyperglycemic-hyperosmolar coma (NKHHC): Principal | ICD-10-CM

## 2016-09-02 DIAGNOSIS — A419 Sepsis, unspecified organism: Secondary | ICD-10-CM

## 2016-09-02 LAB — GLUCOSE, CAPILLARY
GLUCOSE-CAPILLARY: 332 mg/dL — AB (ref 65–99)
GLUCOSE-CAPILLARY: 49 mg/dL — AB (ref 65–99)
GLUCOSE-CAPILLARY: 79 mg/dL (ref 65–99)
GLUCOSE-CAPILLARY: 92 mg/dL (ref 65–99)
Glucose-Capillary: 244 mg/dL — ABNORMAL HIGH (ref 65–99)
Glucose-Capillary: 211 mg/dL — ABNORMAL HIGH (ref 65–99)
Glucose-Capillary: 180 mg/dL — ABNORMAL HIGH (ref 65–99)
Glucose-Capillary: 117 mg/dL — ABNORMAL HIGH (ref 65–99)
Glucose-Capillary: 126 mg/dL — ABNORMAL HIGH (ref 65–99)
Glucose-Capillary: 89 mg/dL (ref 65–99)
Glucose-Capillary: 159 mg/dL — ABNORMAL HIGH (ref 65–99)
Glucose-Capillary: 352 mg/dL — ABNORMAL HIGH (ref 65–99)

## 2016-09-02 LAB — BASIC METABOLIC PANEL
ANION GAP: 10 (ref 5–15)
ANION GAP: 10 (ref 5–15)
ANION GAP: 9 (ref 5–15)
ANION GAP: 7 (ref 5–15)
BUN: 58 mg/dL — ABNORMAL HIGH (ref 6–20)
BUN: 50 mg/dL — ABNORMAL HIGH (ref 6–20)
BUN: 53 mg/dL — ABNORMAL HIGH (ref 6–20)
BUN: 49 mg/dL — ABNORMAL HIGH (ref 6–20)
CALCIUM: 8.6 mg/dL — AB (ref 8.9–10.3)
CALCIUM: 8.5 mg/dL — AB (ref 8.9–10.3)
CALCIUM: 8.6 mg/dL — AB (ref 8.9–10.3)
CALCIUM: 8.3 mg/dL — AB (ref 8.9–10.3)
CO2: 25 mmol/L (ref 22–32)
CO2: 24 mmol/L (ref 22–32)
CO2: 25 mmol/L (ref 22–32)
CO2: 26 mmol/L (ref 22–32)
CREATININE: 1.92 mg/dL — AB (ref 0.61–1.24)
CREATININE: 1.65 mg/dL — AB (ref 0.61–1.24)
CREATININE: 1.7 mg/dL — AB (ref 0.61–1.24)
Chloride: 101 mmol/L (ref 101–111)
Chloride: 101 mmol/L (ref 101–111)
Chloride: 98 mmol/L — ABNORMAL LOW (ref 101–111)
Chloride: 98 mmol/L — ABNORMAL LOW (ref 101–111)
Creatinine, Ser: 1.79 mg/dL — ABNORMAL HIGH (ref 0.61–1.24)
GFR calc Af Amer: 43 mL/min — ABNORMAL LOW (ref >60)
GFR calc Af Amer: 39 mL/min — ABNORMAL LOW (ref >60)
GFR, EST AFRICAN AMERICAN: 36 mL/min — AB (ref >60)
GFR, EST AFRICAN AMERICAN: 41 mL/min — AB (ref >60)
GFR, EST NON AFRICAN AMERICAN: 31 mL/min — AB (ref >60)
GFR, EST NON AFRICAN AMERICAN: 35 mL/min — AB (ref >60)
GFR, EST NON AFRICAN AMERICAN: 37 mL/min — AB (ref >60)
GFR, EST NON AFRICAN AMERICAN: 33 mL/min — AB (ref >60)
GLUCOSE: 84 mg/dL (ref 65–99)
GLUCOSE: 101 mg/dL — AB (ref 65–99)
GLUCOSE: 146 mg/dL — AB (ref 65–99)
GLUCOSE: 324 mg/dL — AB (ref 65–99)
Potassium: 3.4 mmol/L — ABNORMAL LOW (ref 3.5–5.1)
Potassium: 3.1 mmol/L — ABNORMAL LOW (ref 3.5–5.1)
Potassium: 3.2 mmol/L — ABNORMAL LOW (ref 3.5–5.1)
Potassium: 3.7 mmol/L (ref 3.5–5.1)
Sodium: 136 mmol/L (ref 135–145)
Sodium: 132 mmol/L — ABNORMAL LOW (ref 135–145)
Sodium: 135 mmol/L (ref 135–145)
Sodium: 131 mmol/L — ABNORMAL LOW (ref 135–145)

## 2016-09-02 LAB — BLOOD GAS, VENOUS
Acid-Base Excess: 2 mmol/L (ref 0.0–2.0)
BICARBONATE: 30 mmol/L — AB (ref 20.0–28.0)
PCO2 VEN: 61 mmHg — AB (ref 44.0–60.0)
PH VEN: 7.3 (ref 7.250–7.430)
Patient temperature: 37

## 2016-09-02 LAB — CK: Total CK: 742 U/L — ABNORMAL HIGH (ref 49–397)

## 2016-09-02 LAB — MRSA PCR SCREENING: MRSA BY PCR: POSITIVE — AB

## 2016-09-02 LAB — PHOSPHORUS: PHOSPHORUS: 2.7 mg/dL (ref 2.5–4.6)

## 2016-09-02 LAB — INFLUENZA PANEL BY PCR (TYPE A & B)
Influenza A By PCR: NEGATIVE
Influenza B By PCR: NEGATIVE

## 2016-09-02 LAB — LACTIC ACID, PLASMA: Lactic Acid, Venous: 3.2 mmol/L (ref 0.5–1.9)

## 2016-09-02 LAB — MAGNESIUM: MAGNESIUM: 2.1 mg/dL (ref 1.7–2.4)

## 2016-09-02 MED ORDER — SODIUM CHLORIDE 0.9 % IV SOLN: INTRAVENOUS | Status: DC

## 2016-09-02 MED ORDER — INSULIN ASPART 100 UNIT/ML ~~LOC~~ SOLN
0.0000 [IU] | SUBCUTANEOUS | Status: DC
  Administered 2016-09-02: 15 [IU] via SUBCUTANEOUS
  Administered 2016-09-03: 7 [IU] via SUBCUTANEOUS
  Administered 2016-09-03: 4 [IU] via SUBCUTANEOUS
  Administered 2016-09-03 (×2): 7 [IU] via SUBCUTANEOUS
  Administered 2016-09-04: 4 [IU] via SUBCUTANEOUS
  Filled 2016-09-02: qty 7
  Filled 2016-09-02: qty 15
  Filled 2016-09-02: qty 4
  Filled 2016-09-02: qty 5
  Filled 2016-09-02 (×2): qty 7

## 2016-09-02 MED ORDER — DEXTROSE-NACL 5-0.45 % IV SOLN
INTRAVENOUS | Status: DC
  Administered 2016-09-02: 09:00:00 via INTRAVENOUS

## 2016-09-02 MED ORDER — INSULIN GLARGINE 100 UNIT/ML ~~LOC~~ SOLN
10.0000 [IU] | Freq: Every day | SUBCUTANEOUS | Status: DC
  Administered 2016-09-02 – 2016-09-04 (×3): 10 [IU] via SUBCUTANEOUS
  Filled 2016-09-02 (×3): qty 0.1

## 2016-09-02 MED ORDER — VANCOMYCIN HCL IN DEXTROSE 1-5 GM/200ML-% IV SOLN
1000.0000 mg | INTRAVENOUS | Status: DC
Start: 2016-09-02 — End: 2016-09-03
  Administered 2016-09-02: 1000 mg via INTRAVENOUS
  Filled 2016-09-02 (×2): qty 200

## 2016-09-02 MED ORDER — CHLORHEXIDINE GLUCONATE CLOTH 2 % EX PADS
6.0000 | MEDICATED_PAD | Freq: Every day | CUTANEOUS | Status: DC
  Administered 2016-09-02 – 2016-09-04 (×2): 6 via TOPICAL

## 2016-09-02 MED ORDER — HYDROCORTISONE NA SUCCINATE PF 100 MG IJ SOLR
50.0000 mg | Freq: Three times a day (TID) | INTRAMUSCULAR | Status: DC
  Administered 2016-09-02 – 2016-09-04 (×7): 50 mg via INTRAVENOUS
  Filled 2016-09-02 (×7): qty 2

## 2016-09-02 MED ORDER — DEXTROSE 5 % IV SOLN
2.0000 g | Freq: Two times a day (BID) | INTRAVENOUS | Status: DC
  Administered 2016-09-02 – 2016-09-03 (×2): 2 g via INTRAVENOUS
  Filled 2016-09-02 (×3): qty 2

## 2016-09-02 MED ORDER — DONEPEZIL HCL 5 MG PO TABS
10.0000 mg | ORAL_TABLET | Freq: Every day | ORAL | Status: DC
  Administered 2016-09-02 – 2016-09-03 (×2): 10 mg via ORAL
  Filled 2016-09-02 (×2): qty 2

## 2016-09-02 MED ORDER — FENTANYL CITRATE (PF) 100 MCG/2ML IJ SOLN
INTRAMUSCULAR | Status: AC
  Administered 2016-09-02: 50 ug
  Filled 2016-09-02: qty 2

## 2016-09-02 MED ORDER — SODIUM CHLORIDE 0.9 % IV SOLN
0.0000 ug/min | INTRAVENOUS | Status: DC
  Filled 2016-09-02 (×2): qty 1

## 2016-09-02 MED ORDER — GLUCERNA SHAKE PO LIQD
237.0000 mL | Freq: Three times a day (TID) | ORAL | Status: DC
  Administered 2016-09-02 – 2016-09-04 (×5): 237 mL via ORAL

## 2016-09-02 MED ORDER — MUPIROCIN 2 % EX OINT
1.0000 "application " | TOPICAL_OINTMENT | Freq: Two times a day (BID) | CUTANEOUS | Status: DC
  Administered 2016-09-02 – 2016-09-04 (×5): 1 via NASAL
  Filled 2016-09-02: qty 22

## 2016-09-02 MED ORDER — NOREPINEPHRINE 4 MG/250ML-% IV SOLN
0.0000 ug/min | INTRAVENOUS | Status: DC
  Administered 2016-09-02: 28 ug/min via INTRAVENOUS
  Administered 2016-09-02: 27 ug/min via INTRAVENOUS
  Administered 2016-09-02: 17 ug/min via INTRAVENOUS
  Filled 2016-09-02 (×4): qty 250

## 2016-09-02 MED ORDER — INSULIN ASPART 100 UNIT/ML ~~LOC~~ SOLN
1.0000 [IU] | SUBCUTANEOUS | Status: DC
  Administered 2016-09-02: 3 [IU] via SUBCUTANEOUS
  Administered 2016-09-02: 2 [IU] via SUBCUTANEOUS
  Filled 2016-09-02: qty 2
  Filled 2016-09-02: qty 3

## 2016-09-02 NOTE — Care Management Note (Addendum)
Case Management Note  Patient Details  Name: [name] Good MRN: [id number] Date of Birth: 12/16/1930  Subjective/Objective:                  Met with patient and great-granddaughter to discuss discharge planning and during that time I learned that patient is also followed by [facility]. His civilian PCP is Dr. Kendal Hymen of which patient's granddaughter and proclaimed Daivd Council [phone] works for. Patient gets his medications by mail from [place]. [facility] PCP ?Dr. Toney Rakes is aware of admission per [name]. Patient/He wants to stay at [facility] for treatment under [place] agrees. She is aware that she can change her mind at any time. [facility] forms provided and she will call this RNCM to pick up to fax to [place]. He states he typically ambulates with a cane but does have a walker if needed. He lives alone. Tara baths him and provides medications however per great-granddaughter patient can prepare meals and feed himself. O2 is acute. They agree with home health and [facility] home [facility] has a contract with [place] so that is the agency she would like to use.  Action/Plan: Home health list provided to great-granddaughter; [facility] forms to stay/go to [place] also provided to Great-granddaughter which she has taken to her mother [name] for review. I have notified Tanya with [facility] of patient presentation and will fax notes/forms when great-granddaughter returns them today. Santiago Glad with [facility] home [facility] notified of referral. Dr. Benjie Karvonen also notified of home health request. [facility] forms received and faxed to [place] at [facility] 12:55P.   Expected Discharge Date:                  Expected Discharge Plan:     In-House Referral:     Discharge planning Services  CM Consult  Post Acute Care Choice:  Home Health Choice offered to:  Patient  DME Arranged:    DME Agency:     HH Arranged:  RN, PT, Nurse's Aide [place] Agency:  [facility]  Status of Service:  In process, will continue to follow  If discussed at [facility]  Length of Stay Meetings, dates discussed:    Additional Comments:  Marshell Garfinkel, RN 09/02/2016, 11:30 AM

## 2016-09-02 NOTE — Consult Note (Signed)
Name: [name] Good MRN: [id number] DOB: 1930-11-17    ADMISSION DATE:  09/01/2016 CONSULTATION DATE:  09/02/16  REFERRING MD :  Dr. Jannifer Franklin CHIEF COMPLAINT:  AMS and hyperglycemia  BRIEF PATIENT DESCRIPTION:  83 year old male presents to [facility] with AMS profoundly elevated blood glucose  SIGNIFICANT EVENTS  3/23 >> Patient admitted to ICU with  [name] Good and severe hypotension requiring pressors  STUDIES:  3/22 CT head >> Negative for abnormality   HISTORY OF PRESENT ILLNESS:   [name] Good is an 83 yo male with PMH significant for CAD, CHF,DM,HTN, HLD and Sroke.  Patient presents to [facility] ON 3/22 with 2 days of progressive confusion and not taking his medications as he is supposed to.  Patient was found by his daughter on the floor with blood glucose of 800.  Patient was profoundly dehydrated and hypotensive.  Patient received 5L of  Fluid and was initiated on Insulin gtt. Despite of massive fluid boluses patient continues to be hypotensive therefore was started on pressors.  PCCM team consulted for Central line placement  PAST MEDICAL HISTORY :   has a past medical history of Arthritis; CAD (coronary artery disease); Cancer [facility]); CHF (congestive heart failure) ([place]); Diabetes mellitus without complication ([place]); Hyperlipidemia; Hypertension; Restless leg (09/01/2016); Stroke [facility]); and Venous insufficiency.  has a past surgical history that includes Shoulder surgery (Right); Back surgery; Hernia repair; Colonoscopy w/ biopsies and manometry catheter placement (2014); Skin cancer excision; and Eye surgery (Bilateral). Prior to Admission medications   Medication Sig Start Date End Date Taking? Authorizing Provider  aspirin EC 325 MG tablet Take 325 mg by mouth daily.   Yes Historical Provider, MD  atorvastatin (LIPITOR) 20 MG tablet Take 10 mg by mouth daily.   Yes Historical Provider, MD  diltiazem (CARDIZEM CD) 180 MG 24 hr capsule Take 1 capsule (180 mg total) by mouth daily. Patient  taking differently: Take 180 mg by mouth 2 (two) times daily.  10/09/15  Yes Henreitta Leber, MD  docusate sodium (COLACE) 100 MG capsule Take 100 mg by mouth daily.   Yes Historical Provider, MD  donepezil (ARICEPT) 10 MG tablet Take 10 mg by mouth at bedtime.   Yes Historical Provider, MD  doxazosin (CARDURA) 4 MG tablet Take 4 mg by mouth at bedtime.    Yes Historical Provider, MD  furosemide (LASIX) 80 MG tablet Take 1-2 tablets (80-160 mg total) by mouth 2 (two) times daily. Take 80mg  twice daily on Sunday, Monday, Wednesday, Friday, and Saturday. Take 160mg  twice daily on Tuesday and Thursday. 01/15/16  Yes Juline Patch, MD  insulin glargine (LANTUS) 100 UNIT/ML injection Inject 30 Units into the skin every morning. Dr Enzo Montgomery    Yes Historical Provider, MD  losartan (COZAAR) 50 MG tablet Take 50 mg by mouth daily.   Yes Historical Provider, MD  Melatonin 3 MG TABS Take 3 mg by mouth at bedtime.   Yes Historical Provider, MD  meloxicam (MOBIC) 15 MG tablet Take 7.5 mg by mouth daily.   Yes Historical Provider, MD  metFORMIN (GLUCOPHAGE) 500 MG tablet Take 500 mg by mouth 2 (two) times daily with a meal.   Yes Historical Provider, MD  pramipexole (MIRAPEX) 1 MG tablet Take 1.5 tablets (1.5 mg total) by mouth at bedtime. Dr Enzo Montgomery 10/11/15  Yes Loletha Grayer, MD  rOPINIRole (REQUIP) 2 MG tablet Take 2 mg by mouth at bedtime.   Yes Historical Provider, MD  spironolactone (ALDACTONE) 25 MG tablet Take 25 mg  by mouth daily.    Yes Historical Provider, MD  ipratropium-albuterol (DUONEB) 0.5-2.5 (3) MG/3ML SOLN Take 3 mLs by nebulization every 6 (six) hours. Patient not taking: Reported on 10/30/2015 10/11/15   Loletha Grayer, MD   Allergies  Allergen Reactions  . Biaxin [Clarithromycin] Nausea Only    FAMILY HISTORY:  family history includes Heart disease in his father; Hypertension in his father; Stroke in his father. SOCIAL HISTORY:  reports that he has quit smoking. His smokeless tobacco use  includes Chew. He reports that he does not drink alcohol or use drugs.  REVIEW OF SYSTEMS:   Constitutional: Negative for fever, chills, weight loss, malaise/fatigue and diaphoresis.  HENT: Negative for hearing loss, ear pain, nosebleeds, congestion, sore throat, neck pain, tinnitus and ear discharge.   Eyes: Negative for blurred vision, double vision, photophobia, pain, discharge and redness.  Respiratory: Negative for cough, hemoptysis, sputum production, shortness of breath, wheezing and stridor.   Cardiovascular: Negative for chest pain, palpitations, orthopnea, claudication, leg swelling and PND.  Gastrointestinal: Negative for heartburn, nausea, vomiting, abdominal pain, diarrhea, constipation, blood in stool and melena.  Genitourinary: Negative for dysuria, urgency, frequency, hematuria and flank pain.  Musculoskeletal: Negative for myalgias, back pain, joint pain and falls.  Skin: Negative for itching and rash.  Neurological: Negative for dizziness, tingling, tremors, sensory change, speech change, focal weakness, seizures, loss of consciousness, weakness and headaches.  Endo/Heme/Allergies: Negative for environmental allergies and polydipsia. Does not bruise/bleed easily.  SUBJECTIVE: "Patient states that he feels alright"  VITAL SIGNS: Temp:  [97.5 F (36.4 C)] 97.5 F (36.4 C) (03/22 1922) Pulse Rate:  [47-60] 47 (03/22 2315) Resp:  [14-22] 19 (03/22 2325) BP: (79-106)/(43-56) 87/48 (03/22 2325) SpO2:  [92 %-96 %] 93 % (03/22 2315) Weight:  [68 kg (150 lb)] 68 kg (150 lb) (03/22 1923)  PHYSICAL EXAMINATION: General:  Elderly pleasant gentleman , in no distress Neuro:  Awake, Alert, Oriented, HEENT:  AT,,PERRLA, no JVD Cardiovascular:  S1S2, regular, no m/r/g Lungs:  Clear bilaterally, no wheezes, crackles, mildly decreased air entry bilaterally.  Abdomen:  Soft, NT,ND Musculoskeletal:  Active ROM Skin:  Warm, dry and Intact   Recent Labs Lab 09/01/16 1930  NA  126*  K 4.7  CL 85*  CO2 26  BUN 78*  CREATININE 2.90*  GLUCOSE 832*    Recent Labs Lab 09/01/16 1930  HGB 12.8*  HCT 37.9*  WBC 10.8*  PLT 154   Ct Head Wo Contrast  Result Date: 09/01/2016 CLINICAL DATA:  Acute onset of altered mental status. Confusion and lethargy. Found on floor. Generalized weakness. Initial encounter. EXAM: CT HEAD WITHOUT CONTRAST TECHNIQUE: Contiguous axial images were obtained from the base of the skull through the vertex without intravenous contrast. COMPARISON:  CT of the head performed 02/07/2009 FINDINGS: Brain: No evidence of acute infarction, hemorrhage, hydrocephalus, extra-axial collection or mass lesion/mass effect. Prominence of the ventricles sulci reflects moderate cortical volume loss. Cerebellar atrophy is noted. Scattered periventricular and subcortical white matter change likely reflects small vessel ischemic microangiopathy. A chronic infarct is noted at the right basal ganglia, and a chronic lacunar infarct is seen at the left thalamus. The brainstem and fourth ventricle are within normal limits. The cerebral hemispheres demonstrate grossly normal gray-white differentiation. No mass effect or midline shift is seen. Vascular: No hyperdense vessel or unexpected calcification. Skull: There is no evidence of fracture; visualized osseous structures are unremarkable in appearance. Sinuses/Orbits: The orbits are within normal limits. The paranasal sinuses and mastoid air  cells are well-aerated. Other: No significant soft tissue abnormalities are seen. IMPRESSION: 1. No acute intracranial pathology seen on CT. 2. Moderate cortical volume loss and scattered small vessel ischemic microangiopathy. 3. Chronic infarct at the right basal ganglia, and chronic lacunar infarct at the left thalamus. Electronically Signed   By: Garald Balding M.D.   On: 09/01/2016 19:57   Dg Chest Port 1 View  Result Date: 09/01/2016 CLINICAL DATA:  Chest pain EXAM: PORTABLE CHEST 1  VIEW COMPARISON:  10/09/2015 FINDINGS: Stable cardiomegaly with central congestion. No consolidation. No effusion. Atherosclerosis. No pneumothorax. IMPRESSION: Cardiomegaly.  No infiltrate or edema. Electronically Signed   By: Donavan Foil M.D.   On: 09/01/2016 20:01   Dg Shoulder Left  Result Date: 09/01/2016 CLINICAL DATA:  Acute pain EXAM: LEFT SHOULDER - 2+ VIEW COMPARISON:  None. FINDINGS: Oblique, Y scapular, and axillary images were obtained. There is generalized osteoporosis. There is moderate generalized osteoarthritic change. No fracture or dislocation. There is calcification lateral to the left humeral head. There are foci of arterial vascular calcification in the axillary artery on the left. The visualized left lung is clear. There is aortic atherosclerosis. IMPRESSION: No acute fracture or dislocation osteoporosis with moderate generalized osteoarthritic change. Calcification lateral to the humeral head likely is due to calcific tendinosis. There is aortic atherosclerosis as well as left axillary artery atherosclerotic calcification. Electronically Signed   By: Lowella Grip III M.D.   On: 09/01/2016 20:00   Dg Hip Unilat W Or Wo Pelvis 2-3 Views Left  Result Date: 09/01/2016 CLINICAL DATA:  Status post fall, with left hip pain. Initial encounter. EXAM: DG HIP (WITH OR WITHOUT PELVIS) 2-3V LEFT COMPARISON:  None. FINDINGS: There is no evidence of fracture or dislocation. Both femoral heads are seated normally within their respective acetabula. The proximal left femur appears intact. No significant degenerative change is appreciated. The sacroiliac joints are unremarkable in appearance. The visualized bowel gas pattern is grossly unremarkable in appearance. Scattered vascular calcifications are seen. A right-sided mesh is noted along the pelvis. IMPRESSION: No evidence of fracture or dislocation. Electronically Signed   By: Garald Balding M.D.   On: 09/01/2016 21:55    ASSESSMENT /  PLAN:  HHNK Hypotension due to severe septic shock.  Hyponatremia possibly related to severe dehydration Lactic Acidosis Leukocytosis of unclear etiology  Plan Received 5liter fluid Empiric steroids for possible adrenal insufficiency.  Levophed gtt Keep MAP goal>65, wean down levophed as possible.  Insulin Gtt Continue broad spectrum antibiotics    Bincy Varughese,AG-ACNP Pulmonary and [place]   09/02/2016, 2:03 AM   Patient seen and examined with NP, above note reflects my findings, assessment, plan. Patient presents with the hyperglycemia with lactic acidosis, consistent with severe septic shock. The patient is currently on levofloxacin. Lungs clear to auscultation with decreased air entry bilaterally. Wean down Levophed as tolerated, will start empiric steroids for possible adrenal insufficiency. Most recent echocardiogram 10/05/15; EF equals 60%, troponin negative thus far. Patient started undergone IV fluid resuscitation. Status post 5 L, continue levofloxacin.  Marda Stalker, M.D.  09/02/2016  Critical Care Attestation.  I have personally obtained a history, examined the patient, evaluated laboratory and imaging results, formulated the assessment and plan and placed orders. The Patient requires high complexity decision making for assessment and support, frequent evaluation and titration of therapies, application of advanced monitoring technologies and extensive interpretation of multiple databases. The patient has critical illness that could lead imminently to failure of 1 or  more organ systems and requires the highest level of physician preparedness to intervene.  Critical Care Time devoted to patient care services described in this note is 35 minutes and is exclusive of time spent in procedures.

## 2016-09-02 NOTE — Progress Notes (Addendum)
Inpatient Diabetes Program Recommendations  AACE/ADA: New Consensus Statement on Inpatient Glycemic Control (2015)  Target Ranges:  Prepandial:   less than 140 mg/dL      Peak postprandial:   less than 180 mg/dL (1-2 hours)      Critically ill patients:  140 - 180 mg/dL   Lab Results  Component Value Date   GLUCAP 89 09/02/2016   HGBA1C 7.9 (H) 05/24/2013    Review of Glycemic Control  Results for [name] Good, [name] Good (MRN [id number]) as of 09/02/2016 09:18  Ref. Range 09/02/2016 02:03 09/02/2016 03:16 09/02/2016 04:09 09/02/2016 05:25 09/02/2016 06:27 09/02/2016 07:33 09/02/2016 08:42  Glucose-Capillary Latest Ref Range: 65 - 99 mg/dL 211 (H) 180 (H) 117 (H) 79 92 126 (H) 89    Diabetes history: Type 2 Outpatient Diabetes medications: Lantus 20 units qday (per MD note on 08/22/16), Glucophage 500mg  bid  Current orders for Inpatient glycemic control: IV insulin with Phase 2 Adult ICU Glycemic Control order set   Inpatient Diabetes Program Recommendations:  Recommend Lantus 10 units now, then continue Glucostabilzer for 2 more hours.   Novolog correction  1-3 units q4h  Discussed with RN Maryln Gottron, RN, BA, MHA, CDE Diabetes Coordinator Inpatient Diabetes Program  [phone] (Team Pager) [phone] ([place]) 09/02/2016 9:34 AM

## 2016-09-02 NOTE — ED Notes (Signed)
Pt's chart started under incorrect name.

## 2016-09-02 NOTE — Progress Notes (Signed)
Pharmacy Antibiotic Note  [name] Good is a 83 y.o. male admitted on 09/01/2016 with sepsis.  Pharmacy has been consulted for vancomycin and cefepime dosing.  Plan: With improvement in SCr, will increase cefepime to 2 g iv q 12 hours and vancomycin to 1000 mg iv q 24 hours. Will plan on checking vancomycin trough as clinically indicated. Goal trough 15-20 mcg/ml.    Height: 5\' 9"  (175.3 cm) Weight: 150 lb (68 kg) IBW/kg (Calculated) : 70.7  Temp (24hrs), Avg:98.1 F (36.7 C), Min:97.5 F (36.4 C), Max:98.7 F (37.1 C)   Recent Labs Lab 09/01/16 1930 09/01/16 2004 09/01/16 2232 09/02/16 0220 09/02/16 0807 09/02/16 1236 09/02/16 1258  WBC 10.8*  --   --   --   --   --   --   CREATININE 2.90*  --   --   --  1.92* 1.70* 1.65*  LATICACIDVEN  --  3.1* 4.4* 3.2*  --   --   --     Estimated Creatinine Clearance: 32.6 mL/min (A) (by C-G formula based on SCr of 1.65 mg/dL (H)).    Allergies  Allergen Reactions  . Biaxin [Clarithromycin] Nausea Only    Antimicrobials this admission: Vancomycin 3/22 >> Cefepime 3/22  Dose adjustments this admission: 3/23 vancomycin increased to 1000 mg iv q 24 hour  Microbiology results: 3/22 BCx: NGTD 3/22 UCx: pending 3/22 MRSA PCR: positive     3/22 UA: (-) 3/22 CXR: no infiltrate  Thank you for allowing pharmacy to be a part of this patient's care.  Napoleon Form 09/02/2016 2:49 PM

## 2016-09-02 NOTE — Procedures (Signed)
Central Venous Catheter Insertion Procedure Note -Right Femoral  Gamaliel Charney [id number] December 06, 1930  Procedure: Insertion of Central Venous Catheter Indications: Assessment of intravascular volume, Drug and/or fluid administration and Frequent blood sampling  Procedure Details Consent: Risks of procedure as well as the alternatives and risks of each were explained to the (patient/caregiver).  Consent for procedure obtained. Time Out: Verified patient identification, verified procedure, site/side was marked, verified correct patient position, special equipment/implants available, medications/allergies/relevent history reviewed, required imaging and test results available.  Performed  Maximum sterile technique was used including antiseptics, cap, gloves, gown, hand hygiene, mask and sheet. Skin prep: Chlorhexidine; local anesthetic administered A antimicrobial bonded/coated triple lumen catheter was placed in the right femoral vein due to multiple attempts, no other available access and emergent situation using the Seldinger technique.  Evaluation Blood flow good Complications: No apparent complications Patient did tolerate procedure well.    Procedure performed under direct ultrasound guidance for real time vessel cannulation.      [place]  Pulmonary & Critical Care Medicine Pager: [phone]  or [phone] 09/02/2016, 1:19 AM

## 2016-09-02 NOTE — Progress Notes (Signed)
Initial Nutrition Assessment  DOCUMENTATION CODES:   Not applicable  INTERVENTION:  -Add Glucerna Shake po TID, each supplement provides 220 kcal and 10 grams of protein -Discussed use of nutritional supplements/additional snacks at home with pt and family to prevent further wt loss. Agreeable to this plan   NUTRITION DIAGNOSIS:   Inadequate oral intake related to chronic illness as evidenced by per patient/family report.  GOAL:   Patient will meet greater than or equal to 90% of their needs  MONITOR:   PO intake, Supplement acceptance, Labs, Weight trends  REASON FOR ASSESSMENT:   Malnutrition Screening Tool    ASSESSMENT:    83 yo male admitted with diabetic hyperosmolar non-ketotic state, hypotension, AKI. Pt with hx of HTN, CAD, HLD, DM  Pt alert, currently on pressors.  Pt has been eating less over the last several years with declining appetite but still eats 3 meals per day. Pt lives at home alone and prepares his own meals but has very good family support. Reports gradual wt loss over the past several years (180 to 150 pounds); 16.6% wt loss No recorded po intake, pt getting ready to eat lunch on visit today. Good appetite at present  Labs: CBGs 79->600 Meds: reviewed  Diet Order:  Diet Carb Modified Fluid consistency: Thin; Room service appropriate? YesNPO  Skin:  Reviewed, no issues  Last BM:  no documented BM  Height:   Ht Readings from Last 1 Encounters:  09/01/16 5\' 9"  (1.753 m)    Weight:   Wt Readings from Last 1 Encounters:  09/01/16 150 lb (68 kg)    BMI:  Body mass index is 22.15 kg/m.  Estimated Nutritional Needs:   Kcal:  1700-2000 kcals  Protein:  85-100 g  Fluid:  >/= 1.7 L  EDUCATION NEEDS:   Education needs addressed  Kerman Passey MS, [place], LDN [phone] Pager  [phone] Weekend/On-Call Pager

## 2016-09-02 NOTE — Progress Notes (Signed)
Pt arrived to unit from ED. On 8 mcg Levophed through PIV. On pt arrival receiving running 4th bolus, BG read Hi. NP informed of need for possible line. Line placed approx. 0200 R femoral TL. Levo titrated up to 30 mcgNP stated pt MAP goal of 65. ,. Pt A&O X4, tolerated line placement well.  Remains of ICU glycemic protocol/insulin gtt. Will continue to monitor.

## 2016-09-02 NOTE — Progress Notes (Signed)
[facility] [place] NAME: Kristopher Attwood    MR#:  [id number]  DATE OF BIRTH:  [birth year]  SUBJECTIVE:   Patient's on pressors for hypotension.  REVIEW OF SYSTEMS:    Review of Systems  Constitutional: Positive for malaise/fatigue. Negative for chills and fever.  HENT: Negative.  Negative for ear discharge, ear pain, hearing loss, nosebleeds and sore throat.   Eyes: Negative.  Negative for blurred vision and pain.  Respiratory: Negative.  Negative for cough, hemoptysis, shortness of breath and wheezing.   Cardiovascular: Negative.  Negative for chest pain, palpitations and leg swelling.  Gastrointestinal: Negative.  Negative for abdominal pain, blood in stool, diarrhea, nausea and vomiting.  Genitourinary: Negative.  Negative for dysuria.  Musculoskeletal: Negative.  Negative for back pain.  Skin: Negative.   Neurological: Positive for weakness. Negative for dizziness, tremors, speech change, focal weakness, seizures and headaches.  Endo/Heme/Allergies: Negative.  Does not bruise/bleed easily.  Psychiatric/Behavioral: Negative.  Negative for depression, hallucinations and suicidal ideas.    Tolerating Diet: npo      DRUG ALLERGIES:   Allergies  Allergen Reactions  . Biaxin [Clarithromycin] Nausea Only    VITALS:  Blood pressure (!) 123/46, pulse 67, temperature 98.1 F (36.7 C), temperature source Oral, resp. rate 19, height 5\' 9"  (1.753 m), weight 68 kg (150 lb), SpO2 91 %.  PHYSICAL EXAMINATION:   Physical Exam  Constitutional: He is oriented to person, place, and time and well-developed, well-nourished, and in no distress. No distress.  HENT:  Head: Normocephalic.  Eyes: No scleral icterus.  Neck: Normal range of motion. Neck supple. No JVD present. No tracheal deviation present.  Cardiovascular: Normal rate and regular rhythm.  Exam reveals no gallop and no friction rub.   Murmur heard. Pulmonary/Chest: Effort normal  and breath sounds normal. No respiratory distress. He has no wheezes. He has no rales. He exhibits no tenderness.  Abdominal: Soft. Bowel sounds are normal. He exhibits no distension and no mass. There is no tenderness. There is no rebound and no guarding.  Musculoskeletal: Normal range of motion. He exhibits no edema.  Neurological: He is alert and oriented to person, place, and time.  Skin: Skin is warm. No rash noted. No erythema.  Psychiatric: Affect and judgment normal.      LABORATORY PANEL:   CBC  Recent Labs Lab 09/01/16 1930  WBC 10.8*  HGB 12.8*  HCT 37.9*  PLT 154   ------------------------------------------------------------------------------------------------------------------  Chemistries   Recent Labs Lab 09/01/16 1930 09/02/16 0807  NA 126* 136  K 4.7 3.4*  CL 85* 101  CO2 26 25  GLUCOSE 832* 84  BUN 78* 58*  CREATININE 2.90* 1.92*  CALCIUM 9.6 8.6*  AST 41  --   ALT 20  --   ALKPHOS 113  --   BILITOT 0.9  --    ------------------------------------------------------------------------------------------------------------------  Cardiac Enzymes  Recent Labs Lab 09/01/16 1930  TROPONINI 0.11*   ------------------------------------------------------------------------------------------------------------------  RADIOLOGY:  Ct Head Wo Contrast  Result Date: 09/01/2016 CLINICAL DATA:  Acute onset of altered mental status. Confusion and lethargy. Found on floor. Generalized weakness. Initial encounter. EXAM: CT HEAD WITHOUT CONTRAST TECHNIQUE: Contiguous axial images were obtained from the base of the skull through the vertex without intravenous contrast. COMPARISON:  CT of the head performed 02/07/2009 FINDINGS: Brain: No evidence of acute infarction, hemorrhage, hydrocephalus, extra-axial collection or mass lesion/mass effect. Prominence of the ventricles sulci reflects moderate cortical volume loss. Cerebellar atrophy is noted.  Scattered  periventricular and subcortical white matter change likely reflects small vessel ischemic microangiopathy. A chronic infarct is noted at the right basal ganglia, and a chronic lacunar infarct is seen at the left thalamus. The brainstem and fourth ventricle are within normal limits. The cerebral hemispheres demonstrate grossly normal gray-white differentiation. No mass effect or midline shift is seen. Vascular: No hyperdense vessel or unexpected calcification. Skull: There is no evidence of fracture; visualized osseous structures are unremarkable in appearance. Sinuses/Orbits: The orbits are within normal limits. The paranasal sinuses and mastoid air cells are well-aerated. Other: No significant soft tissue abnormalities are seen. IMPRESSION: 1. No acute intracranial pathology seen on CT. 2. Moderate cortical volume loss and scattered small vessel ischemic microangiopathy. 3. Chronic infarct at the right basal ganglia, and chronic lacunar infarct at the left thalamus. Electronically Signed   By: Garald Balding M.D.   On: 09/01/2016 19:57   Dg Chest Port 1 View  Result Date: 09/01/2016 CLINICAL DATA:  Chest pain EXAM: PORTABLE CHEST 1 VIEW COMPARISON:  10/09/2015 FINDINGS: Stable cardiomegaly with central congestion. No consolidation. No effusion. Atherosclerosis. No pneumothorax. IMPRESSION: Cardiomegaly.  No infiltrate or edema. Electronically Signed   By: Donavan Foil M.D.   On: 09/01/2016 20:01   Dg Shoulder Left  Result Date: 09/01/2016 CLINICAL DATA:  Acute pain EXAM: LEFT SHOULDER - 2+ VIEW COMPARISON:  None. FINDINGS: Oblique, Y scapular, and axillary images were obtained. There is generalized osteoporosis. There is moderate generalized osteoarthritic change. No fracture or dislocation. There is calcification lateral to the left humeral head. There are foci of arterial vascular calcification in the axillary artery on the left. The visualized left lung is clear. There is aortic atherosclerosis.  IMPRESSION: No acute fracture or dislocation osteoporosis with moderate generalized osteoarthritic change. Calcification lateral to the humeral head likely is due to calcific tendinosis. There is aortic atherosclerosis as well as left axillary artery atherosclerotic calcification. Electronically Signed   By: Lowella Grip III M.D.   On: 09/01/2016 20:00   Dg Hip Unilat W Or Wo Pelvis 2-3 Views Left  Result Date: 09/01/2016 CLINICAL DATA:  Status post fall, with left hip pain. Initial encounter. EXAM: DG HIP (WITH OR WITHOUT PELVIS) 2-3V LEFT COMPARISON:  None. FINDINGS: There is no evidence of fracture or dislocation. Both femoral heads are seated normally within their respective acetabula. The proximal left femur appears intact. No significant degenerative change is appreciated. The sacroiliac joints are unremarkable in appearance. The visualized bowel gas pattern is grossly unremarkable in appearance. Scattered vascular calcifications are seen. A right-sided mesh is noted along the pelvis. IMPRESSION: No evidence of fracture or dislocation. Electronically Signed   By: Garald Balding M.D.   On: 09/01/2016 21:55     ASSESSMENT AND PLAN:   83 year old male with a history of CAD, CVA and diabetes who presented with confusion.  1. Acute metabolic encephalopathy in the setting of HHNK:  Start Lantus 10 units now and glucose stabilizer for 2 more hours as per her conditions by diabetes coordinator Hemoglobin A1c  2. Hypotension due to severe dehydration from hyperglycemia Continue pressors for MAP>65 Empiric steroids for possible adrenal insufficiency as per recommendations by intensivist Continue empiric antibiotics until final blood cultures come back.  3. Acute kidney injury: Resolving with IV fluids  4. Elevated troponin due to demand ischemia and not ACS 5. Dementia: Continue Aricept  6. Hyperlipidemia: Continue statin  7. History of CAD: Continue aspirin and statin  Management  plans discussed with the patient  and he is in agreement.  CODE STATUS: DNR  Critical care TOTAL TIME TAKING CARE OF THIS PATIENT: 30 minutes.     POSSIBLE D/C 2-3 days, DEPENDING ON CLINICAL CONDITION.   Izak Anding M.D on 09/02/2016 at 11:58 AM  Between 7am to 6pm - Pager - [phone] After 6pm go to [email or web address] - password [email or web address] [facility]  Office  [phone]  CC: Primary care physician; Otilio Miu, MD  Note: This dictation was prepared with Dragon dictation along with smaller phrase technology. Any transcriptional errors that result from this process are unintentional.

## 2016-09-03 ENCOUNTER — Inpatient Hospital Stay
Admit: 2016-09-03 | Discharge: 2016-09-03 | Disposition: A | Discharge status: Home / Self Care | Payer: Medicare Other | Attending: Internal Medicine | Admitting: Internal Medicine

## 2016-09-03 ENCOUNTER — Inpatient Hospital Stay: Payer: Medicare Other

## 2016-09-03 LAB — GLUCOSE, CAPILLARY
GLUCOSE-CAPILLARY: 173 mg/dL — AB (ref 65–99)
GLUCOSE-CAPILLARY: 186 mg/dL — AB (ref 65–99)
GLUCOSE-CAPILLARY: 230 mg/dL — AB (ref 65–99)
GLUCOSE-CAPILLARY: 250 mg/dL — AB (ref 65–99)
Glucose-Capillary: 101 mg/dL — ABNORMAL HIGH (ref 65–99)
Glucose-Capillary: 102 mg/dL — ABNORMAL HIGH (ref 65–99)
Glucose-Capillary: 156 mg/dL — ABNORMAL HIGH (ref 65–99)
Glucose-Capillary: 229 mg/dL — ABNORMAL HIGH (ref 65–99)

## 2016-09-03 LAB — BASIC METABOLIC PANEL
Anion gap: 3 — ABNORMAL LOW (ref 5–15)
Anion gap: 5 (ref 5–15)
BUN: 41 mg/dL — AB (ref 6–20)
BUN: 37 mg/dL — AB (ref 6–20)
CALCIUM: 8.4 mg/dL — AB (ref 8.9–10.3)
CALCIUM: 8.7 mg/dL — AB (ref 8.9–10.3)
CO2: 29 mmol/L (ref 22–32)
CO2: 29 mmol/L (ref 22–32)
CREATININE: 1.33 mg/dL — AB (ref 0.61–1.24)
CREATININE: 1.21 mg/dL (ref 0.61–1.24)
Chloride: 105 mmol/L (ref 101–111)
Chloride: 103 mmol/L (ref 101–111)
GFR calc non Af Amer: 48 mL/min — ABNORMAL LOW (ref >60)
GFR calc non Af Amer: 54 mL/min — ABNORMAL LOW (ref >60)
GFR, EST AFRICAN AMERICAN: 55 mL/min — AB (ref >60)
Glucose, Bld: 114 mg/dL — ABNORMAL HIGH (ref 65–99)
Glucose, Bld: 179 mg/dL — ABNORMAL HIGH (ref 65–99)
Potassium: 3.7 mmol/L (ref 3.5–5.1)
Potassium: 3.9 mmol/L (ref 3.5–5.1)
SODIUM: 137 mmol/L (ref 135–145)
SODIUM: 137 mmol/L (ref 135–145)

## 2016-09-03 LAB — CBC
HCT: 32.7 % — ABNORMAL LOW (ref 40.0–52.0)
Hemoglobin: 11.2 g/dL — ABNORMAL LOW (ref 13.0–18.0)
MCH: 30 pg (ref 26.0–34.0)
MCHC: 34.2 g/dL (ref 32.0–36.0)
MCV: 87.7 fL (ref 80.0–100.0)
Platelets: 121 10*3/uL — ABNORMAL LOW (ref 150–440)
RBC: 3.73 MIL/uL — ABNORMAL LOW (ref 4.40–5.90)
RDW: 15.1 % — AB (ref 11.5–14.5)
WBC: 7.8 10*3/uL (ref 3.8–10.6)

## 2016-09-03 LAB — URINE CULTURE: Culture: NO GROWTH

## 2016-09-03 LAB — PROCALCITONIN: Procalcitonin: <0.1 ng/mL

## 2016-09-03 MED ORDER — DEXTROSE 50 % IV SOLN
1.0000 | Freq: Once | INTRAVENOUS | Status: AC
  Administered 2016-09-03: 50 mL via INTRAVENOUS

## 2016-09-03 MED ORDER — ROPINIROLE HCL 1 MG PO TABS
2.0000 mg | ORAL_TABLET | Freq: Every day | ORAL | Status: DC
  Administered 2016-09-03: 2 mg via ORAL
  Filled 2016-09-03: qty 2

## 2016-09-03 MED ORDER — DILTIAZEM HCL ER COATED BEADS 180 MG PO CP24
180.0000 mg | ORAL_CAPSULE | Freq: Two times a day (BID) | ORAL | Status: DC
  Administered 2016-09-03 – 2016-09-04 (×2): 180 mg via ORAL
  Filled 2016-09-03 (×3): qty 1

## 2016-09-03 MED ORDER — DEXTROSE 50 % IV SOLN
INTRAVENOUS | Status: AC
  Filled 2016-09-03: qty 50

## 2016-09-03 MED ORDER — LOSARTAN POTASSIUM 50 MG PO TABS
50.0000 mg | ORAL_TABLET | Freq: Every day | ORAL | Status: DC
  Administered 2016-09-03 – 2016-09-04 (×2): 50 mg via ORAL
  Filled 2016-09-03 (×2): qty 1

## 2016-09-03 MED ORDER — PRAMIPEXOLE DIHYDROCHLORIDE 0.25 MG PO TABS
1.5000 mg | ORAL_TABLET | Freq: Every day | ORAL | Status: DC
  Administered 2016-09-03: 1.5 mg via ORAL
  Filled 2016-09-03: qty 6

## 2016-09-03 NOTE — Progress Notes (Signed)
Patient off vasopressors No acute resp issues  No ICU needs or Pulmonary needs at this time Will sign off. Please call back with any questions

## 2016-09-03 NOTE — Progress Notes (Signed)
Report to [facility] on [address]. Denies questions.  Pt transported to floor via bed.

## 2016-09-03 NOTE — Progress Notes (Signed)
[facility] at [place] NAME: [name] Good    MR#:  [id number]  DATE OF BIRTH:  05-14-1931  SUBJECTIVE:   Patient off of pressors  REVIEW OF SYSTEMS:    Review of Systems  Constitutional: Positive for malaise/fatigue. Negative for chills and fever.  HENT: Negative.  Negative for ear discharge, ear pain, hearing loss, nosebleeds and sore throat.   Eyes: Negative.  Negative for blurred vision and pain.  Respiratory: Negative.  Negative for cough, hemoptysis, shortness of breath and wheezing.   Cardiovascular: Negative.  Negative for chest pain, palpitations and leg swelling.  Gastrointestinal: Negative.  Negative for abdominal pain, blood in stool, diarrhea, nausea and vomiting.  Genitourinary: Negative.  Negative for dysuria.  Musculoskeletal: Negative.  Negative for back pain.  Skin: Negative.   Neurological: Positive for weakness. Negative for dizziness, tremors, speech change, focal weakness, seizures and headaches.  Endo/Heme/Allergies: Negative.  Does not bruise/bleed easily.  Psychiatric/Behavioral: Negative.  Negative for depression, hallucinations and suicidal ideas.    Tolerating Diet:  yes     DRUG ALLERGIES:   Allergies  Allergen Reactions  . Biaxin [Clarithromycin] Nausea Only    VITALS:  Blood pressure (!) 148/86, pulse 98, temperature 98.7 F (37.1 C), resp. rate (!) 24, height 5\' 9"  (1.753 m), weight 68 kg (150 lb), SpO2 94 %.  PHYSICAL EXAMINATION:   Physical Exam  Constitutional: He is oriented to person, place, and time and well-developed, well-nourished, and in no distress. No distress.  HENT:  Head: Normocephalic.  Eyes: No scleral icterus.  Neck: Normal range of motion. Neck supple. No JVD present. No tracheal deviation present.  Cardiovascular: Normal rate and regular rhythm.  Exam reveals no gallop and no friction rub.   Murmur heard. Pulmonary/Chest: Effort normal and breath sounds normal. No  respiratory distress. He has no wheezes. He has no rales. He exhibits no tenderness.  Abdominal: Soft. Bowel sounds are normal. He exhibits no distension and no mass. There is no tenderness. There is no rebound and no guarding.  Musculoskeletal: Normal range of motion. He exhibits no edema.  Neurological: He is alert and oriented to person, place, and time.  Skin: Skin is warm. No rash noted. No erythema.  Psychiatric: Affect and judgment normal.      LABORATORY PANEL:   CBC  Recent Labs Lab 09/03/16 0500  WBC 7.8  HGB 11.2*  HCT 32.7*  PLT 121*   ------------------------------------------------------------------------------------------------------------------  Chemistries   Recent Labs Lab 09/01/16 1930  09/02/16 2023  09/03/16 0825  NA 126*  < > 131*  < > 137  K 4.7  < > 3.7  < > 3.9  CL 85*  < > 98*  < > 103  CO2 26  < > 26  < > 29  GLUCOSE 832*  < > 324*  < > 179*  BUN 78*  < > 49*  < > 37*  CREATININE 2.90*  < > 1.79*  < > 1.21  CALCIUM 9.6  < > 8.3*  < > 8.7*  MG  --   --  2.1  --   --   AST 41  --   --   --   --   ALT 20  --   --   --   --   ALKPHOS 113  --   --   --   --   BILITOT 0.9  --   --   --   --   < > =  values in this interval not displayed. ------------------------------------------------------------------------------------------------------------------  Cardiac Enzymes  Recent Labs Lab 09/01/16 1930  TROPONINI 0.11*   ------------------------------------------------------------------------------------------------------------------  RADIOLOGY:  Dg Chest 1 View  Result Date: 09/03/2016 CLINICAL DATA:  Dyspnea. EXAM: CHEST 1 VIEW COMPARISON:  09/01/2016. FINDINGS: Cardiomegaly. No active infiltrates or failure. Chronic scarring throughout both lung fields. Mild vascular congestion centrally. Atherosclerosis. No pneumothorax or significant effusion. Chronic elevation RIGHT diaphragm. IMPRESSION: Stable chest. Electronically Signed   By: Staci Righter M.D.   On: 09/03/2016 07:22   Ct Head Wo Contrast  Result Date: 09/01/2016 CLINICAL DATA:  Acute onset of altered mental status. Confusion and lethargy. Found on floor. Generalized weakness. Initial encounter. EXAM: CT HEAD WITHOUT CONTRAST TECHNIQUE: Contiguous axial images were obtained from the base of the skull through the vertex without intravenous contrast. COMPARISON:  CT of the head performed 02/07/2009 FINDINGS: Brain: No evidence of acute infarction, hemorrhage, hydrocephalus, extra-axial collection or mass lesion/mass effect. Prominence of the ventricles sulci reflects moderate cortical volume loss. Cerebellar atrophy is noted. Scattered periventricular and subcortical white matter change likely reflects small vessel ischemic microangiopathy. A chronic infarct is noted at the right basal ganglia, and a chronic lacunar infarct is seen at the left thalamus. The brainstem and fourth ventricle are within normal limits. The cerebral hemispheres demonstrate grossly normal gray-white differentiation. No mass effect or midline shift is seen. Vascular: No hyperdense vessel or unexpected calcification. Skull: There is no evidence of fracture; visualized osseous structures are unremarkable in appearance. Sinuses/Orbits: The orbits are within normal limits. The paranasal sinuses and mastoid air cells are well-aerated. Other: No significant soft tissue abnormalities are seen. IMPRESSION: 1. No acute intracranial pathology seen on CT. 2. Moderate cortical volume loss and scattered small vessel ischemic microangiopathy. 3. Chronic infarct at the right basal ganglia, and chronic lacunar infarct at the left thalamus. Electronically Signed   By: Garald Balding M.D.   On: 09/01/2016 19:57   Dg Chest Port 1 View  Result Date: 09/01/2016 CLINICAL DATA:  Chest pain EXAM: PORTABLE CHEST 1 VIEW COMPARISON:  10/09/2015 FINDINGS: Stable cardiomegaly with central congestion. No consolidation. No effusion.  Atherosclerosis. No pneumothorax. IMPRESSION: Cardiomegaly.  No infiltrate or edema. Electronically Signed   By: Donavan Foil M.D.   On: 09/01/2016 20:01   Dg Shoulder Left  Result Date: 09/01/2016 CLINICAL DATA:  Acute pain EXAM: LEFT SHOULDER - 2+ VIEW COMPARISON:  None. FINDINGS: Oblique, Y scapular, and axillary images were obtained. There is generalized osteoporosis. There is moderate generalized osteoarthritic change. No fracture or dislocation. There is calcification lateral to the left humeral head. There are foci of arterial vascular calcification in the axillary artery on the left. The visualized left lung is clear. There is aortic atherosclerosis. IMPRESSION: No acute fracture or dislocation osteoporosis with moderate generalized osteoarthritic change. Calcification lateral to the humeral head likely is due to calcific tendinosis. There is aortic atherosclerosis as well as left axillary artery atherosclerotic calcification. Electronically Signed   By: Lowella Grip III M.D.   On: 09/01/2016 20:00   Dg Hip Unilat W Or Wo Pelvis 2-3 Views Left  Result Date: 09/01/2016 CLINICAL DATA:  Status post fall, with left hip pain. Initial encounter. EXAM: DG HIP (WITH OR WITHOUT PELVIS) 2-3V LEFT COMPARISON:  None. FINDINGS: There is no evidence of fracture or dislocation. Both femoral heads are seated normally within their respective acetabula. The proximal left femur appears intact. No significant degenerative change is appreciated. The sacroiliac joints are unremarkable in appearance. The  visualized bowel gas pattern is grossly unremarkable in appearance. Scattered vascular calcifications are seen. A right-sided mesh is noted along the pelvis. IMPRESSION: No evidence of fracture or dislocation. Electronically Signed   By: Garald Balding M.D.   On: 09/01/2016 21:55     ASSESSMENT AND PLAN:   83 year old male with a history of CAD, CVA and diabetes who presented with confusion.  1. Acute  metabolic encephalopathy in the setting of HHNK: A1c =7.9 Continue Lantus 10 units, SSI and ADA diet  2. Hypotension due to severe dehydration from hyperglycemia He is off of pressors. . Sepsis has been ruled out. Hypotension was primarily due to severe dehydration. Operative antibiotics 3. Acute kidney injury: Resolving with IV fluids  4. Elevated troponin due to demand ischemia and not ACS 5. Dementia: Continue Aricept  6. Hyperlipidemia: Continue statin  7. History of CAD: Continue aspirin and statin  8. History of hypertension: Now the blood pressures improved I will restart diltiazem   Physical therapy consultation for disposition planning Management plans discussed with the patient and he is in agreement.  CODE STATUS: DNR   TOTAL TIME TAKING CARE OF THIS PATIENT: 24 minutes.     POSSIBLE D/C tomorrow, DEPENDING ON CLINICAL CONDITION.   Harlene Petralia M.D on 09/03/2016 at 10:47 AM  Between 7am to 6pm - Pager - [phone] After 6pm go to [email or web address] - password [email or web address] [facility]  Office  [phone]  CC: Primary care physician; Otilio Miu, MD  Note: This dictation was prepared with Dragon dictation along with smaller phrase technology. Any transcriptional errors that result from this process are unintentional.

## 2016-09-03 NOTE — Progress Notes (Signed)
*  PRELIMINARY RESULTS* Echocardiogram 2D Echocardiogram has been performed.  Lavell Luster Linwood Gullikson 09/03/2016, 11:56 AM

## 2016-09-04 DIAGNOSIS — L8990 Pressure ulcer of unspecified site, unspecified stage: Secondary | ICD-10-CM | POA: Insufficient documentation

## 2016-09-04 LAB — GLUCOSE, CAPILLARY
GLUCOSE-CAPILLARY: 111 mg/dL — AB (ref 65–99)
GLUCOSE-CAPILLARY: 190 mg/dL — AB (ref 65–99)
Glucose-Capillary: 120 mg/dL — ABNORMAL HIGH (ref 65–99)
Glucose-Capillary: 330 mg/dL — ABNORMAL HIGH (ref 65–99)

## 2016-09-04 LAB — ECHOCARDIOGRAM COMPLETE
HEIGHTINCHES: 69 in
WEIGHTICAEL: 2400 [oz_av]

## 2016-09-04 LAB — PROCALCITONIN: Procalcitonin: <0.1 ng/mL

## 2016-09-04 MED ORDER — GLUCERNA SHAKE PO LIQD: 237.0000 mL | Freq: Three times a day (TID) | ORAL | 1 refills | Status: AC

## 2016-09-04 NOTE — Progress Notes (Signed)
Physical Therapy Evaluation Patient Details Name: [name] Good MRN: [id number] DOB: December 29, 1930 Today's Date: 09/04/2016   History of Present Illness  Patient is an 83 y.o. male admitted on 23 March after experiencing 2 days of progressive confusion and later being found unresponsive on floor. Admitted for diabetic hyperosmolar non-ketotic state. PMH includes HTN, HLD, CAD, AKI, and sepsis.   Clinical Impression  Patient is a pleasant male admitted for above listed reasons. Upon evaluation, patient demonstrates modified independence with bed mobility and transfers, with verbal cues used for safe sequencing. Patient ambulated ~30' with seated rest break at 20' to prepare bedside chair for sitting. Patient required verbal cues to stand upright. Patient demonstrates decreased R shoulder ROM and strength and has received HHPT in past. It is believed that he will benefit from f/u at OP PT with a focus on manual therapy to restore shoulder function and thereby improve his overall mobility and function.     Follow Up Recommendations Outpatient PT    Equipment Recommendations  None recommended by PT    Recommendations for Other Services       Precautions / Restrictions Precautions Precautions: Fall Restrictions Weight Bearing Restrictions: No      Mobility  Bed Mobility Overal bed mobility: Modified Independent             General bed mobility comments: Patient performs bed mobility with modified independence, utilizing bed rails to move to EOB.  Transfers Overall transfer level: Modified independent Equipment used: Rolling walker (2 wheeled)             General transfer comment: Patient performs sit to stand transfer with verbal cues for proper sequencing/hand placement.  Ambulation/Gait Ambulation/Gait assistance: Min assist Ambulation Distance (Feet): 30 Feet Assistive device: Rolling walker (2 wheeled)       General Gait Details: Patient ambulates with kyphotic  posture at decreased cadence. Seated rest break performed after 20' to prepare chair for bed to chair transfer.   Stairs            Wheelchair Mobility    Modified Rankin (Stroke Patients Only)       Balance Overall balance assessment: Modified Independent                                           Pertinent Vitals/Pain Pain Assessment: Faces Faces Pain Scale: Hurts a little bit Pain Location: R shoulder/back Pain Descriptors / Indicators: Aching Pain Intervention(s): Limited activity within patient's tolerance;Monitored during session;Repositioned    Home Living Family/patient expects to be discharged to:: Private residence Living Arrangements: Spouse/significant other;Other relatives;Other (Comment) (Niece) Available Help at Discharge: Family;Available 24 hours/day;Available PRN/intermittently Type of Home: House Home Access: Level entry     Home Layout: One level;Other (Comment) (Slight ramped inclines throughout) Home Equipment: Walker - 2 wheels;Cane - single point;Grab bars - tub/shower;Grab bars - toilet;Shower seat      Prior Function Level of Independence: Needs assistance   Gait / Transfers Assistance Needed: Modified independent with SPC on R/RW for household distances  ADL's / Homemaking Assistance Needed: Performed by family; daughter assists with bathing/groceries occasionally        Hand Dominance   Dominant Hand: Right    Extremity/Trunk Assessment   Upper Extremity Assessment Upper Extremity Assessment: RUE deficits/detail;Generalized weakness RUE Deficits / Details: R A/PROM shoulder flexion <105 deg., limited by pain; 3 to 3+/5 strength RUE:  Unable to fully assess due to pain    Lower Extremity Assessment Lower Extremity Assessment: Generalized weakness;Overall WFL for tasks assessed       Communication   Communication: No difficulties  Cognition Arousal/Alertness: Awake/alert Behavior During Therapy: WFL for  tasks assessed/performed Overall Cognitive Status: Within Functional Limits for tasks assessed                                        General Comments      Exercises     Assessment/Plan    PT Assessment Patient needs continued PT services  PT Problem List Decreased strength;Decreased range of motion;Decreased activity tolerance;Decreased balance;Decreased mobility;Decreased safety awareness;Pain       PT Treatment Interventions DME instruction;Gait training;Functional mobility training;Therapeutic activities;Therapeutic exercise;Patient/family education    PT Goals (Current goals can be found in the Care Plan section)  Acute Rehab PT Goals Patient Stated Goal: "To go home" PT Goal Formulation: With patient/family Time For Goal Achievement: 09/18/16 Potential to Achieve Goals: Good    Frequency Min 2X/week   Barriers to discharge        Co-evaluation               End of Session Equipment Utilized During Treatment: Gait belt Activity Tolerance: Patient tolerated treatment well Patient left: in chair;with call bell/phone within reach;with chair alarm set;with family/visitor present Nurse Communication: Mobility status PT Visit Diagnosis: Unsteadiness on feet (R26.81);Pain;Muscle weakness (generalized) (M62.81) Pain - Right/Left: Right Pain - part of body: Shoulder    Time: 3570-1779 PT Time Calculation (min) (ACUTE ONLY): 22 min   Charges:   PT Evaluation $PT Eval Low Complexity: 1 Procedure     PT G Codes:          Dorice Lamas, PT, DPT 09/04/2016, 11:28 AM

## 2016-09-04 NOTE — Care Management Note (Signed)
Case Management Note  Patient Details  Name: Lamark Schue MRN: [id number] Date of Birth: 30-Aug-1930  Subjective/Objective:    Discussed home health -PT, RN, Aide referrals with Malachy Mood at [facility]. Malachy Mood  confirms that Rocky Morel has a Soil scientist with the [facility] so she is pursuing an authorization from the [place] for [employer] to provide Mr Broadus outpatient services.                 Action/Plan:   Expected Discharge Date:  09/04/16               Expected Discharge Plan:     In-House Referral:     Discharge planning Services  CM Consult  Post Acute Care Choice:  Home Health Choice offered to:  Patient  DME Arranged:    DME Agency:     HH Arranged:  RN, PT, Nurse's Aide [place] Agency:  [facility]  Status of Service:  In process, will continue to follow  If discussed at Long Length of Stay Meetings, dates discussed:    Additional Comments:  Mana Haberl A, RN 09/04/2016, 11:56 AM

## 2016-09-04 NOTE — Progress Notes (Signed)
Pt to be discharged per MD order. IV removed. Instructions reviewed with patient and family who verbalize understanding. HH setup via [place] from [place]. Will discharge pt in wheelchair

## 2016-09-04 NOTE — Progress Notes (Signed)
[place] at [place] NAME: [name] Good    MR#:  [id number]  DATE OF BIRTH:  07-Nov-1930  SUBJECTIVE:   Had confusion overnight  REVIEW OF SYSTEMS:    Review of Systems  Constitutional: Positive for malaise/fatigue. Negative for chills and fever.  HENT: Negative.  Negative for ear discharge, ear pain, hearing loss, nosebleeds and sore throat.   Eyes: Negative.  Negative for blurred vision and pain.  Respiratory: Negative.  Negative for cough, hemoptysis, shortness of breath and wheezing.   Cardiovascular: Negative.  Negative for chest pain, palpitations and leg swelling.  Gastrointestinal: Negative.  Negative for abdominal pain, blood in stool, diarrhea, nausea and vomiting.  Genitourinary: Negative.  Negative for dysuria.  Musculoskeletal: Negative.  Negative for back pain.  Skin: Negative.   Neurological: Positive for weakness. Negative for dizziness, tremors, speech change, focal weakness, seizures and headaches.  Endo/Heme/Allergies: Negative.  Does not bruise/bleed easily.  Psychiatric/Behavioral: Negative.  Negative for depression, hallucinations and suicidal ideas.    Tolerating Diet:  yes     DRUG ALLERGIES:   Allergies  Allergen Reactions  . Biaxin [Clarithromycin] Nausea Only    VITALS:  Blood pressure 129/78, pulse 85, temperature 97.6 F (36.4 C), temperature source Oral, resp. rate 16, height 5\' 9"  (1.753 m), weight 68 kg (150 lb), SpO2 96 %.  PHYSICAL EXAMINATION:   Physical Exam  Constitutional: He is oriented to person, place, and time and well-developed, well-nourished, and in no distress. No distress.  HENT:  Head: Normocephalic.  Eyes: No scleral icterus.  Neck: Normal range of motion. Neck supple. No JVD present. No tracheal deviation present.  Cardiovascular: Normal rate and regular rhythm.  Exam reveals no gallop and no friction rub.   Murmur heard. Pulmonary/Chest: Effort normal and breath sounds  normal. No respiratory distress. He has no wheezes. He has no rales. He exhibits no tenderness.  Abdominal: Soft. Bowel sounds are normal. He exhibits no distension and no mass. There is no tenderness. There is no rebound and no guarding.  Musculoskeletal: Normal range of motion. He exhibits no edema.  Neurological: He is alert and oriented to person, place, and time.  Skin: Skin is warm. No rash noted. No erythema.  Psychiatric: Affect and judgment normal.      LABORATORY PANEL:   CBC  Recent Labs Lab 09/03/16 0500  WBC 7.8  HGB 11.2*  HCT 32.7*  PLT 121*   ------------------------------------------------------------------------------------------------------------------  Chemistries   Recent Labs Lab 09/01/16 1930  09/02/16 2023  09/03/16 0825  NA 126*  < > 131*  < > 137  K 4.7  < > 3.7  < > 3.9  CL 85*  < > 98*  < > 103  CO2 26  < > 26  < > 29  GLUCOSE 832*  < > 324*  < > 179*  BUN 78*  < > 49*  < > 37*  CREATININE 2.90*  < > 1.79*  < > 1.21  CALCIUM 9.6  < > 8.3*  < > 8.7*  MG  --   --  2.1  --   --   AST 41  --   --   --   --   ALT 20  --   --   --   --   ALKPHOS 113  --   --   --   --   BILITOT 0.9  --   --   --   --   < > =  values in this interval not displayed. ------------------------------------------------------------------------------------------------------------------  Cardiac Enzymes  Recent Labs Lab 09/01/16 1930  TROPONINI 0.11*   ------------------------------------------------------------------------------------------------------------------  RADIOLOGY:  Dg Chest 1 View  Result Date: 09/03/2016 CLINICAL DATA:  Dyspnea. EXAM: CHEST 1 VIEW COMPARISON:  09/01/2016. FINDINGS: Cardiomegaly. No active infiltrates or failure. Chronic scarring throughout both lung fields. Mild vascular congestion centrally. Atherosclerosis. No pneumothorax or significant effusion. Chronic elevation RIGHT diaphragm. IMPRESSION: Stable chest. Electronically Signed    By: [name] Good M.D.   On: 09/03/2016 07:22     ASSESSMENT AND PLAN:   83 year old male with a history of CAD, CVA and diabetes who presented with confusion.  1. Acute metabolic encephalopathy in the setting of HHNK: A1c =7.9 Patient will be restarted on his outpatient dose of Lantus at discharge with metformin Continue ADA diet   2. Hypotension due to severe dehydration from hyperglycemia He is off of pressors. . Sepsis has been ruled out. Hypotension was primarily due to severe dehydration.   3. Acute kidney injury which has resolved with IV fluids  4. Elevated troponin due to demand ischemia and not ACS 5. Dementia: Continue Aricept  6. Hyperlipidemia: Continue statin  7. History of CAD: Continue aspirin and statin  8. History of hypertension: Continue diltiazem  Physical therapy consultation for disposition planning Management plans discussed with the patient and he is in agreement.  CODE STATUS: DNR   TOTAL TIME TAKING CARE OF THIS PATIENT: 24 minutes.     POSSIBLE D/C today, DEPENDING ON CLINICAL CONDITION.   [name] Good M.D on 09/04/2016 at 10:53 AM  Between 7am to 6pm - Pager - [phone] After 6pm go to [email or web address] - password [email or web address] [facility]  Office  [phone]  CC: Primary care physician; [name] Miu, MD  Note: This dictation was prepared with Dragon dictation along with smaller phrase technology. Any transcriptional errors that result from this process are unintentional.

## 2016-09-04 NOTE — Discharge Summary (Signed)
[place] at [place] NAME: [name] Good    MR#:  [id number]  DATE OF BIRTH:  Aug 19, 1930  DATE OF ADMISSION:  09/01/2016 ADMITTING PHYSICIAN: Lance Coon, MD  DATE OF DISCHARGE: 09/04/2016  PRIMARY CARE PHYSICIAN: Otilio Miu, MD    ADMISSION DIAGNOSIS:  Acute hyponatremia [E87.1] Lactic acidosis [E87.2] Elevated troponin I level [R74.8] Hyperglycemia without ketosis [R73.9] Sepsis, due to unspecified organism [facility]) [B76.2] Acute metabolic encephalopathy [G31.51]  DISCHARGE DIAGNOSIS:  Principal Problem:   Diabetic hyperosmolar non-ketotic state ([place]) Active Problems:   HTN (hypertension)   HLD (hyperlipidemia)   CAD (coronary artery disease)   Lactic acidosis   AKI (acute kidney injury) ([place])   Sepsis ([place])   SECONDARY DIAGNOSIS:   Past Medical History:  Diagnosis Date  . Arthritis   . CAD (coronary artery disease)   . Cancer ([place])   . CHF (congestive heart failure) ([place])   . Diabetes mellitus without complication ([place])   . Hyperlipidemia   . Hypertension   . Restless leg 09/01/2016  . Stroke ([place])   . Venous insufficiency     HOSPITAL COURSE:  83 year old male with a history of CAD, CVA and diabetes who presented with confusion.  1. Acute metabolic encephalopathy in the setting of HHNK: A1c =7.9 Continue Lantus 30 units And metformin ADA diet  2. Hypotension due to severe dehydration from hyperglycemia He is off of pressors. . Sepsis has been ruled out. Hypotension was primarily due to severe dehydration.  3. Acute kidney injury: Resolved with IV fluids 4. Elevated troponin due to demand ischemia and not ACS 5. Dementia: Continue Aricept  6. Hyperlipidemia: Continue statin  7. History of CAD: Continue aspirin and statin  8. History of hypertension: Blood pressures improved and he has been restarted on outpatient medications  DISCHARGE CONDITIONS AND DIET:   Stable for discharge on diabetic  diet  CONSULTS OBTAINED:    DRUG ALLERGIES:   Allergies  Allergen Reactions  . Biaxin [Clarithromycin] Nausea Only    DISCHARGE MEDICATIONS:   Current Discharge Medication List    START taking these medications   Details  feeding supplement, GLUCERNA SHAKE, (GLUCERNA SHAKE) LIQD Take 237 mLs by mouth 3 (three) times daily between meals. Qty: 21330 mL, Refills: 1      CONTINUE these medications which have NOT CHANGED   Details  aspirin EC 325 MG tablet Take 325 mg by mouth daily.    atorvastatin (LIPITOR) 20 MG tablet Take 10 mg by mouth daily.    diltiazem (CARDIZEM CD) 180 MG 24 hr capsule Take 1 capsule (180 mg total) by mouth daily.    docusate sodium (COLACE) 100 MG capsule Take 100 mg by mouth daily.    donepezil (ARICEPT) 10 MG tablet Take 10 mg by mouth at bedtime.    doxazosin (CARDURA) 4 MG tablet Take 4 mg by mouth at bedtime.     furosemide (LASIX) 80 MG tablet Take 1-2 tablets (80-160 mg total) by mouth 2 (two) times daily. Take 80mg  twice daily on Sunday, Monday, Wednesday, Friday, and Saturday. Take 160mg  twice daily on Tuesday and Thursday. Qty: 80 tablet, Refills: 3    insulin glargine (LANTUS) 100 UNIT/ML injection Inject 30 Units into the skin every morning. Dr Enzo Montgomery     losartan (COZAAR) 50 MG tablet Take 50 mg by mouth daily.    Melatonin 3 MG TABS Take 3 mg by mouth at bedtime.    meloxicam (MOBIC) 15 MG tablet Take 7.5  mg by mouth daily.    metFORMIN (GLUCOPHAGE) 500 MG tablet Take 500 mg by mouth 2 (two) times daily with a meal.    pramipexole (MIRAPEX) 1 MG tablet Take 1.5 tablets (1.5 mg total) by mouth at bedtime. Dr Enzo Montgomery    rOPINIRole (REQUIP) 2 MG tablet Take 2 mg by mouth at bedtime.    spironolactone (ALDACTONE) 25 MG tablet Take 25 mg by mouth daily.     ipratropium-albuterol (DUONEB) 0.5-2.5 (3) MG/3ML SOLN Take 3 mLs by nebulization every 6 (six) hours. Qty: 360 mL, Refills: 0          Today   CHIEF COMPLAINT:    Doing well this am Had some confusion through the night   VITAL SIGNS:  Blood pressure 129/78, pulse 85, temperature 97.6 F (36.4 C), temperature source Oral, resp. rate 16, height 5\' 9"  (1.753 m), weight 68 kg (150 lb), SpO2 96 %.   REVIEW OF SYSTEMS:  Review of Systems  Constitutional: Negative for chills, fever and malaise/fatigue.  HENT: Negative.  Negative for ear discharge, ear pain, hearing loss, nosebleeds and sore throat.   Eyes: Negative.  Negative for blurred vision and pain.  Respiratory: Negative.  Negative for cough, hemoptysis, shortness of breath and wheezing.   Cardiovascular: Negative.  Negative for chest pain, palpitations and leg swelling.  Gastrointestinal: Negative.  Negative for abdominal pain, blood in stool, diarrhea, nausea and vomiting.  Genitourinary: Negative.  Negative for dysuria.  Musculoskeletal: Negative.  Negative for back pain.  Skin: Negative.   Neurological: Positive for weakness. Negative for dizziness, tremors, speech change, focal weakness, seizures and headaches.  Endo/Heme/Allergies: Negative.  Does not bruise/bleed easily.  Psychiatric/Behavioral: Negative.  Negative for depression, hallucinations and suicidal ideas.     PHYSICAL EXAMINATION:  GENERAL:  83 y.o.-year-old patient lying in the bed with no acute distress.  NECK:  Supple, no jugular venous distention. No thyroid enlargement, no tenderness.  LUNGS: Normal breath sounds bilaterally, no wheezing, rales,rhonchi  No use of accessory muscles of respiration.  CARDIOVASCULAR: S1, S2 normal. 3/6 murmurs, NO rubs, or gallops.  ABDOMEN: Soft, non-tender, non-distended. Bowel sounds present. No organomegaly or mass.  EXTREMITIES: No pedal edema, cyanosis, or clubbing.  PSYCHIATRIC: The patient is alert and oriented x 3.  SKIN: No obvious rash, lesion, or ulcer.   DATA REVIEW:   CBC  Recent Labs Lab 09/03/16 0500  WBC 7.8  HGB 11.2*  HCT 32.7*  PLT 121*    Chemistries    Recent Labs Lab 09/01/16 1930  09/02/16 2023  09/03/16 0825  NA 126*  < > 131*  < > 137  K 4.7  < > 3.7  < > 3.9  CL 85*  < > 98*  < > 103  CO2 26  < > 26  < > 29  GLUCOSE 832*  < > 324*  < > 179*  BUN 78*  < > 49*  < > 37*  CREATININE 2.90*  < > 1.79*  < > 1.21  CALCIUM 9.6  < > 8.3*  < > 8.7*  MG  --   --  2.1  --   --   AST 41  --   --   --   --   ALT 20  --   --   --   --   ALKPHOS 113  --   --   --   --   BILITOT 0.9  --   --   --   --   < > =  values in this interval not displayed.  Cardiac Enzymes  Recent Labs Lab 09/01/16 1930  TROPONINI 0.11*    Microbiology Results  @[email or web address] @  RADIOLOGY:  Dg Chest 1 View  Result Date: 09/03/2016 CLINICAL DATA:  Dyspnea. EXAM: CHEST 1 VIEW COMPARISON:  09/01/2016. FINDINGS: Cardiomegaly. No active infiltrates or failure. Chronic scarring throughout both lung fields. Mild vascular congestion centrally. Atherosclerosis. No pneumothorax or significant effusion. Chronic elevation RIGHT diaphragm. IMPRESSION: Stable chest. Electronically Signed   By: Staci Righter M.D.   On: 09/03/2016 07:22      Current Discharge Medication List    START taking these medications   Details  feeding supplement, GLUCERNA SHAKE, (GLUCERNA SHAKE) LIQD Take 237 mLs by mouth 3 (three) times daily between meals. Qty: 21330 mL, Refills: 1      CONTINUE these medications which have NOT CHANGED   Details  aspirin EC 325 MG tablet Take 325 mg by mouth daily.    atorvastatin (LIPITOR) 20 MG tablet Take 10 mg by mouth daily.    diltiazem (CARDIZEM CD) 180 MG 24 hr capsule Take 1 capsule (180 mg total) by mouth daily.    docusate sodium (COLACE) 100 MG capsule Take 100 mg by mouth daily.    donepezil (ARICEPT) 10 MG tablet Take 10 mg by mouth at bedtime.    doxazosin (CARDURA) 4 MG tablet Take 4 mg by mouth at bedtime.     furosemide (LASIX) 80 MG tablet Take 1-2 tablets (80-160 mg total) by mouth 2 (two) times daily. Take 80mg  twice daily on  Sunday, Monday, Wednesday, Friday, and Saturday. Take 160mg twice daily on Tuesday and Thursday. Qty: 80 tablet, Refills: 3    insulin glargine (LANTUS) 100 UNIT/ML injection Inject 30 Units into the skin every morning. Dr Kroner     losartan (COZAAR) 50 MG tablet Take 50 mg by mouth daily.    Melatonin 3 MG TABS Take 3 mg by mouth at bedtime.    meloxicam (MOBIC) 15 MG tablet Take 7.5 mg by mouth daily.    metFORMIN (GLUCOPHAGE) 500 MG tablet Take 500 mg by mouth 2 (two) times daily with a meal.    pramipexole (MIRAPEX) 1 MG tablet Take 1.5 tablets (1.5 mg total) by mouth at bedtime. Dr Kroner    rOPINIRole (REQUIP) 2 MG tablet Take 2 mg by mouth at bedtime.    spironolactone (ALDACTONE) 25 MG tablet Take 25 mg by mouth daily.     ipratropium-albuterol (DUONEB) 0.5-2.5 (3) MG/3ML SOLN Take 3 mLs by nebulization every 6 (six) hours. Qty: 360 mL, Refills: 0           Management plans discussed with the patient and family and they are in agreement. Stable for discharge   Patient should follow up with pcp  CODE STATUS:     Code Status Orders        Start     Ordered   09/01/16 2351  Do not attempt resuscitation (DNR)  Continuous    Question Answer Comment  In the event of cardiac or respiratory ARREST Do not call a "code blue"   In the event of cardiac or respiratory ARREST Do not perform Intubation, CPR, defibrillation or ACLS   In the event of cardiac or respiratory ARREST Use medication by any route, position, wound care, and other measures to relive pain and suffering. May use oxygen, suction and manual treatment of airway obstruction as needed for comfort.      03 /22/18 2350    Code  Status History    Date Active Date Inactive Code Status Order ID Comments User Context   09/01/2016  9:20 PM 09/01/2016 11:50 PM DNR [id number]  Merlyn Lot, MD ED   10/05/2015 12:08 AM 10/11/2015  2:18 PM Full Code [id number]  Mikael Spray, NP ED    Advance Directive  Documentation     Most Recent Value  Type of Advance Directive  Living will  Pre-existing out of facility DNR order (yellow form or pink MOST form)  -  "MOST" Form in Place?  -      TOTAL TIME TAKING CARE OF THIS PATIENT: 37 minutes.    Note: This dictation was prepared with Dragon dictation along with smaller phrase technology. Any transcriptional errors that result from this process are unintentional.  Maricela Kawahara M.D on 09/04/2016 at 10:51 AM  Between 7am to 6pm - Pager - [phone] After 6pm go to [email or web address] - password [email or web address] [facility]  Office  [phone]  CC: Primary care physician; Otilio Miu, MD

## 2016-09-06 LAB — CULTURE, BLOOD (ROUTINE X 2)
CULTURE: NO GROWTH
CULTURE: NO GROWTH

## 2016-10-03 DIAGNOSIS — D489 Neoplasm of uncertain behavior, unspecified: Secondary | ICD-10-CM | POA: Diagnosis not present

## 2016-10-03 DIAGNOSIS — C44711 Basal cell carcinoma of skin of unspecified lower limb, including hip: Secondary | ICD-10-CM | POA: Diagnosis not present

## 2016-10-03 DIAGNOSIS — S90822D Blister (nonthermal), left foot, subsequent encounter: Secondary | ICD-10-CM | POA: Diagnosis not present

## 2016-10-17 ENCOUNTER — Emergency Department: Payer: Medicare Other

## 2016-10-17 ENCOUNTER — Encounter: Payer: Self-pay | Admitting: Emergency Medicine

## 2016-10-17 ENCOUNTER — Inpatient Hospital Stay
Admission: EM | Admit: 2016-10-17 | Discharge: 2016-10-19 | DRG: 682 | Disposition: A | Discharge status: Home Health | Payer: Medicare Other | Attending: Internal Medicine | Admitting: Internal Medicine

## 2016-10-17 DIAGNOSIS — F17220 Nicotine dependence, chewing tobacco, uncomplicated: Secondary | ICD-10-CM | POA: Diagnosis present

## 2016-10-17 DIAGNOSIS — R05 Cough: Secondary | ICD-10-CM | POA: Diagnosis not present

## 2016-10-17 DIAGNOSIS — Z794 Long term (current) use of insulin: Secondary | ICD-10-CM | POA: Diagnosis not present

## 2016-10-17 DIAGNOSIS — Z66 Do not resuscitate: Secondary | ICD-10-CM | POA: Diagnosis present

## 2016-10-17 DIAGNOSIS — I2510 Atherosclerotic heart disease of native coronary artery without angina pectoris: Secondary | ICD-10-CM | POA: Diagnosis not present

## 2016-10-17 DIAGNOSIS — R059 Cough, unspecified: Secondary | ICD-10-CM

## 2016-10-17 DIAGNOSIS — F0390 Unspecified dementia without behavioral disturbance: Secondary | ICD-10-CM | POA: Diagnosis present

## 2016-10-17 DIAGNOSIS — E1110 Type 2 diabetes mellitus with ketoacidosis without coma: Secondary | ICD-10-CM | POA: Diagnosis not present

## 2016-10-17 DIAGNOSIS — N179 Acute kidney failure, unspecified: Secondary | ICD-10-CM | POA: Diagnosis not present

## 2016-10-17 DIAGNOSIS — E1310 Other specified diabetes mellitus with ketoacidosis without coma: Secondary | ICD-10-CM | POA: Diagnosis not present

## 2016-10-17 DIAGNOSIS — Z8249 Family history of ischemic heart disease and other diseases of the circulatory system: Secondary | ICD-10-CM

## 2016-10-17 DIAGNOSIS — E785 Hyperlipidemia, unspecified: Secondary | ICD-10-CM | POA: Diagnosis present

## 2016-10-17 DIAGNOSIS — A084 Viral intestinal infection, unspecified: Secondary | ICD-10-CM | POA: Diagnosis present

## 2016-10-17 DIAGNOSIS — L89152 Pressure ulcer of sacral region, stage 2: Secondary | ICD-10-CM | POA: Diagnosis present

## 2016-10-17 DIAGNOSIS — Z8673 Personal history of transient ischemic attack (TIA), and cerebral infarction without residual deficits: Secondary | ICD-10-CM | POA: Diagnosis not present

## 2016-10-17 DIAGNOSIS — K529 Noninfective gastroenteritis and colitis, unspecified: Secondary | ICD-10-CM

## 2016-10-17 DIAGNOSIS — Z823 Family history of stroke: Secondary | ICD-10-CM

## 2016-10-17 DIAGNOSIS — E861 Hypovolemia: Secondary | ICD-10-CM | POA: Diagnosis present

## 2016-10-17 DIAGNOSIS — R112 Nausea with vomiting, unspecified: Secondary | ICD-10-CM

## 2016-10-17 DIAGNOSIS — R1111 Vomiting without nausea: Secondary | ICD-10-CM | POA: Diagnosis not present

## 2016-10-17 DIAGNOSIS — E1010 Type 1 diabetes mellitus with ketoacidosis without coma: Secondary | ICD-10-CM | POA: Diagnosis not present

## 2016-10-17 DIAGNOSIS — Z85038 Personal history of other malignant neoplasm of large intestine: Secondary | ICD-10-CM

## 2016-10-17 DIAGNOSIS — I5032 Chronic diastolic (congestive) heart failure: Secondary | ICD-10-CM

## 2016-10-17 DIAGNOSIS — I482 Chronic atrial fibrillation: Secondary | ICD-10-CM | POA: Diagnosis not present

## 2016-10-17 DIAGNOSIS — E860 Dehydration: Secondary | ICD-10-CM | POA: Diagnosis not present

## 2016-10-17 DIAGNOSIS — Z85828 Personal history of other malignant neoplasm of skin: Secondary | ICD-10-CM

## 2016-10-17 DIAGNOSIS — Z7982 Long term (current) use of aspirin: Secondary | ICD-10-CM

## 2016-10-17 DIAGNOSIS — K5730 Diverticulosis of large intestine without perforation or abscess without bleeding: Secondary | ICD-10-CM | POA: Diagnosis not present

## 2016-10-17 DIAGNOSIS — I959 Hypotension, unspecified: Secondary | ICD-10-CM

## 2016-10-17 DIAGNOSIS — I10 Essential (primary) hypertension: Secondary | ICD-10-CM | POA: Diagnosis not present

## 2016-10-17 DIAGNOSIS — G2581 Restless legs syndrome: Secondary | ICD-10-CM | POA: Diagnosis present

## 2016-10-17 LAB — COMPREHENSIVE METABOLIC PANEL
ALBUMIN: 4.2 g/dL (ref 3.5–5.0)
ALK PHOS: 89 U/L (ref 38–126)
ALT: 12 U/L — ABNORMAL LOW (ref 17–63)
ANION GAP: 21 — AB (ref 5–15)
AST: 27 U/L (ref 15–41)
BUN: 94 mg/dL — AB (ref 6–20)
CO2: 17 mmol/L — AB (ref 22–32)
Calcium: 9.2 mg/dL (ref 8.9–10.3)
Chloride: 93 mmol/L — ABNORMAL LOW (ref 101–111)
Creatinine, Ser: 3.72 mg/dL — ABNORMAL HIGH (ref 0.61–1.24)
GFR calc Af Amer: 16 mL/min — ABNORMAL LOW (ref >60)
GFR calc non Af Amer: 14 mL/min — ABNORMAL LOW (ref >60)
GLUCOSE: 461 mg/dL — AB (ref 65–99)
POTASSIUM: 5.6 mmol/L — AB (ref 3.5–5.1)
SODIUM: 131 mmol/L — AB (ref 135–145)
Total Bilirubin: 0.9 mg/dL (ref 0.3–1.2)
Total Protein: 6.7 g/dL (ref 6.5–8.1)

## 2016-10-17 LAB — C DIFFICILE QUICK SCREEN W PCR REFLEX
C Diff antigen: NEGATIVE
C Diff interpretation: NOT DETECTED
C Diff toxin: NEGATIVE

## 2016-10-17 LAB — URINALYSIS, COMPLETE (UACMP) WITH MICROSCOPIC
BILIRUBIN URINE: NEGATIVE
GLUCOSE, UA: 50 mg/dL — AB
HGB URINE DIPSTICK: NEGATIVE
Ketones, ur: 5 mg/dL — AB
Leukocytes, UA: NEGATIVE
Nitrite: NEGATIVE
PH: 5 (ref 5.0–8.0)
Protein, ur: 30 mg/dL — AB
SPECIFIC GRAVITY, URINE: 1.018 (ref 1.005–1.030)

## 2016-10-17 LAB — GASTROINTESTINAL PANEL BY PCR, STOOL (REPLACES STOOL CULTURE)

## 2016-10-17 LAB — CBC
HEMATOCRIT: 38.6 % — AB (ref 40.0–52.0)
HEMOGLOBIN: 12.6 g/dL — AB (ref 13.0–18.0)
MCH: 30.3 pg (ref 26.0–34.0)
MCHC: 32.8 g/dL (ref 32.0–36.0)
MCV: 92.3 fL (ref 80.0–100.0)
Platelets: 147 10*3/uL — ABNORMAL LOW (ref 150–440)
RBC: 4.18 MIL/uL — ABNORMAL LOW (ref 4.40–5.90)
RDW: 15.7 % — ABNORMAL HIGH (ref 11.5–14.5)
WBC: 9.2 10*3/uL (ref 3.8–10.6)

## 2016-10-17 LAB — LIPASE, BLOOD: Lipase: 39 U/L (ref 11–51)

## 2016-10-17 LAB — GLUCOSE, CAPILLARY
Glucose-Capillary: 438 mg/dL — ABNORMAL HIGH (ref 65–99)
Glucose-Capillary: 404 mg/dL — ABNORMAL HIGH (ref 65–99)

## 2016-10-17 LAB — LACTIC ACID, PLASMA: LACTIC ACID, VENOUS: 4.6 mmol/L — AB (ref 0.5–1.9)

## 2016-10-17 MED ORDER — SODIUM CHLORIDE 0.9 % IV BOLUS (SEPSIS)
1000.0000 mL | Freq: Once | INTRAVENOUS | Status: AC
  Administered 2016-10-17: 1000 mL via INTRAVENOUS

## 2016-10-17 MED ORDER — IOPAMIDOL (ISOVUE-300) INJECTION 61%
15.0000 mL | INTRAVENOUS | Status: AC
  Administered 2016-10-17: 15 mL via ORAL

## 2016-10-17 MED ORDER — DEXTROSE-NACL 5-0.45 % IV SOLN
INTRAVENOUS | Status: DC
  Administered 2016-10-18: 1000 mL via INTRAVENOUS

## 2016-10-17 MED ORDER — ONDANSETRON HCL 4 MG/2ML IJ SOLN
4.0000 mg | Freq: Once | INTRAMUSCULAR | Status: AC
  Administered 2016-10-17: 4 mg via INTRAVENOUS
  Filled 2016-10-17: qty 2

## 2016-10-17 MED ORDER — SODIUM CHLORIDE 0.9 % IV BOLUS (SEPSIS)
250.0000 mL | Freq: Once | INTRAVENOUS | Status: AC
  Administered 2016-10-17: 250 mL via INTRAVENOUS

## 2016-10-17 MED ORDER — ONDANSETRON HCL 4 MG/2ML IJ SOLN
4.0000 mg | Freq: Once | INTRAMUSCULAR | Status: AC
  Administered 2016-10-17: 4 mg via INTRAVENOUS

## 2016-10-17 MED ORDER — ONDANSETRON HCL 4 MG/2ML IJ SOLN
INTRAMUSCULAR | Status: AC
  Filled 2016-10-17: qty 2

## 2016-10-17 MED ORDER — CIPROFLOXACIN IN D5W 400 MG/200ML IV SOLN
400.0000 mg | Freq: Once | INTRAVENOUS | Status: AC
  Administered 2016-10-17: 400 mg via INTRAVENOUS
  Filled 2016-10-17: qty 200

## 2016-10-17 MED ORDER — METRONIDAZOLE IN NACL 5-0.79 MG/ML-% IV SOLN
500.0000 mg | Freq: Once | INTRAVENOUS | Status: AC
  Administered 2016-10-17: 500 mg via INTRAVENOUS
  Filled 2016-10-17: qty 100

## 2016-10-17 MED ORDER — SODIUM CHLORIDE 0.9 % IV SOLN
INTRAVENOUS | Status: DC
  Administered 2016-10-17: 3.8 [IU]/h via INTRAVENOUS
  Filled 2016-10-17: qty 2.5

## 2016-10-17 NOTE — ED Provider Notes (Signed)
[facility] Emergency Department Provider Note  ____________________________________________  Time seen: Approximately 8:11 PM  I have reviewed the triage vital signs and the nursing notes.   HISTORY  Chief Complaint Hypotension; Emesis; and Diarrhea    HPI [name] Good is a 83 y.o. male with a history of colon ca s/p resection, HTN, HL, CVA, CHF, CAD, presenting with of nausea, vomiting, and diarrhea. 4 days ago, the patient had an episode of nausea. The following day he developed diarrhea, which he has had 2-4 times daily since the onset of symptoms. The patient has had some watery stools, stools that look like they have "from," and dark stool in the lobby here. He has had diffuse nonfocal abdominal discomfort and cramping. On Saturday, the patient had one episode of nausea and vomiting with diaphoresis which resolved. The patient has not had any fever or shaking chills. No urinary symptoms. At home, the patient had hypotension with blood pressures in the 50s over 30s and on arrival to the emergency department his blood pressure was 63/34. On my examination, the patient's blood pressure was 97/54 and he was mentating well.   Past Medical History:  Diagnosis Date  . Arthritis   . CAD (coronary artery disease)   . Cancer ([place])   . CHF (congestive heart failure) ([place])   . Diabetes mellitus without complication ([place])   . Hyperlipidemia   . Hypertension   . Restless leg 09/01/2016  . Stroke ([place])   . Venous insufficiency     Patient Active Problem List   Diagnosis Date Noted  . DKA (diabetic ketoacidoses) ([place]) 10/17/2016  . Enteritis 10/17/2016  . Pressure injury of skin 09/04/2016  . Sepsis ([place])   . HTN (hypertension) 09/01/2016  . HLD (hyperlipidemia) 09/01/2016  . CAD (coronary artery disease) 09/01/2016  . Diabetic hyperosmolar non-ketotic state ([place]) 09/01/2016  . Lactic acidosis 09/01/2016  . AKI (acute kidney injury) ([place]) 09/01/2016  .  Septic shock ([place]) 10/05/2015  . Aspiration pneumonia of right lower lobe ([place])   . Acute on chronic respiratory failure with hypoxia and hypercapnia (HCC)   . Abdominal pain, acute, left upper quadrant 02/02/2015    Past Surgical History:  Procedure Laterality Date  . BACK SURGERY    . COLONOSCOPY W/ BIOPSIES AND MANOMETRY CATHETER PLACEMENT  2014   normal  . EYE SURGERY Bilateral    cataract  . HERNIA REPAIR    . SHOULDER SURGERY Right   . SKIN CANCER EXCISION     nose, ear, arm, and face    Current Outpatient Rx  . Order #: [id number] Class: Historical Med  . Order #: [id number] Class: Historical Med  . Order #: [id number] Class: Historical Med  . Order #: [id number] Class: Historical Med  . Order #: [id number] Class: Historical Med  . Order #: [id number] Class: Historical Med  . Order #: [id number] Class: Historical Med  . Order #: [id number] Class: No Print  . Order #: [id number] Class: Historical Med  . Order #: [id number] Class: Print  . Order #: [id number] Class: Normal    Allergies Biaxin [clarithromycin]  Family History  Problem Relation Age of Onset  . Heart disease Father   . Hypertension Father   . Stroke Father     Social History Social History  Substance Use Topics  . Smoking status: Former Research scientist (life sciences)  . Smokeless tobacco: Current User    Types: Chew  . Alcohol use No    Review of Systems Constitutional: No fever/chills.No lightheadedness. No syncope. Positive diaphoresis during the episode of vomiting.  Eyes: No visual changes. ENT: No sore throat. No congestion or rhinorrhea. Cardiovascular: Denies chest pain. Denies palpitations. Positive hypotension. Respiratory: Denies shortness of breath.  No cough. Gastrointestinal: Diffuse nonfocal abdominal pain.  Positive nausea, positive vomiting.  Positive diarrhea.  No constipation. Genitourinary: Negative for dysuria. Musculoskeletal: Negative for back pain. Skin: Negative for rash. Neurological: Negative for headaches.  No focal numbness, tingling or weakness.   10-point ROS otherwise negative.  ____________________________________________   PHYSICAL EXAM:  VITAL SIGNS: ED Triage Vitals  Enc Vitals Group     BP 10/17/16 1917 (!) 63/34     Pulse Rate 10/17/16 1917 67     Resp 10/17/16 1917 20     Temp 10/17/16 1917 97.9 F (36.6 C)     Temp Source 10/17/16 1917 Oral     SpO2 10/17/16 1917 98 %     Weight 10/17/16 1918 150 lb (68 kg)     Height 10/17/16 1918 5\' 10"  (1.778 m)     Head Circumference --      Peak Flow --      Pain Score --      Pain Loc --      Pain Edu? --      Excl. in [place]? --     Constitutional: Alert and oriented. Chronically ill appearing but nontoxic. Answers questions appropriately. Eyes: Conjunctivae are normal.  EOMI. No scleral icterus. Head: Atraumatic. Nose: No congestion/rhinnorhea. Mouth/Throat: Mucous membranes are moist.  Neck: No stridor.  Supple.   Cardiovascular: Normal rate, regular rhythm. No murmurs, rubs or gallops.  Respiratory: Normal respiratory effort.  No accessory muscle use or retractions. Lungs CTAB.  No wheezes, rales or ronchi. Gastrointestinal: Soft, and nondistended.  Diffuse tenderness to palpation without focality. No guarding or rebound.  No peritoneal signs. Musculoskeletal: No LE edema.  Neurologic:  A&Ox3.  Speech is clear.  Face and smile are symmetric.  EOMI.  Moves all extremities well. Skin:  Skin is warm, dry and intact. No rash noted. Psychiatric: Mood and affect are normal. Speech and behavior are normal.  Normal judgement.  ____________________________________________   LABS (all labs ordered are listed, but only abnormal results are displayed)  Labs Reviewed  MRSA PCR SCREENING - Abnormal; Notable for the following:       Result Value   MRSA by PCR POSITIVE (*)    All other components within normal limits  COMPREHENSIVE METABOLIC PANEL - Abnormal; Notable for the following:    Sodium 131 (*)    Potassium 5.6 (*)     Chloride 93 (*)    CO2 17 (*)    Glucose, Bld 461 (*)    BUN 94 (*)    Creatinine, Ser 3.72 (*)    ALT 12 (*)    GFR calc non Af Amer 14 (*)    GFR calc Af Amer 16 (*)    Anion gap 21 (*)    All other components within normal limits  CBC - Abnormal; Notable for the following:    RBC 4.18 (*)    Hemoglobin 12.6 (*)    HCT 38.6 (*)    RDW 15.7 (*)    Platelets 147 (*)    All other components within normal limits  URINALYSIS, COMPLETE (UACMP) WITH MICROSCOPIC - Abnormal; Notable for the following:    Color, Urine YELLOW (*)    APPearance HAZY (*)    Glucose, UA 50 (*)    Ketones, ur 5 (*)    Protein, ur 30 (*)  Bacteria, UA RARE (*)    Squamous Epithelial / LPF 0-5 (*)    All other components within normal limits  LACTIC ACID, PLASMA - Abnormal; Notable for the following:    Lactic Acid, Venous 4.6 (*)    All other components within normal limits  LACTIC ACID, PLASMA - Abnormal; Notable for the following:    Lactic Acid, Venous 3.3 (*)    All other components within normal limits  GLUCOSE, CAPILLARY - Abnormal; Notable for the following:    Glucose-Capillary 438 (*)    All other components within normal limits  BLOOD GAS, VENOUS - Abnormal; Notable for the following:    pH, Ven 7.15 (*)    Bicarbonate 15.7 (*)    Acid-base deficit 12.7 (*)    All other components within normal limits  GLUCOSE, CAPILLARY - Abnormal; Notable for the following:    Glucose-Capillary 404 (*)    All other components within normal limits  GLUCOSE, CAPILLARY - Abnormal; Notable for the following:    Glucose-Capillary 361 (*)    All other components within normal limits  GLUCOSE, CAPILLARY - Abnormal; Notable for the following:    Glucose-Capillary 238 (*)    All other components within normal limits  BASIC METABOLIC PANEL - Abnormal; Notable for the following:    Glucose, Bld 213 (*)    BUN 87 (*)    Creatinine, Ser 3.35 (*)    Calcium 8.6 (*)    GFR calc non Af Amer 16 (*)    GFR calc  Af Amer 18 (*)    All other components within normal limits  BASIC METABOLIC PANEL - Abnormal; Notable for the following:    Sodium 134 (*)    CO2 21 (*)    BUN 77 (*)    Creatinine, Ser 2.72 (*)    Calcium 8.4 (*)    GFR calc non Af Amer 20 (*)    GFR calc Af Amer 23 (*)    All other components within normal limits  BASIC METABOLIC PANEL - Abnormal; Notable for the following:    Sodium 132 (*)    CO2 19 (*)    Glucose, Bld 183 (*)    BUN 65 (*)    Creatinine, Ser 2.39 (*)    Calcium 8.4 (*)    GFR calc non Af Amer 23 (*)    GFR calc Af Amer 27 (*)    All other components within normal limits  CBC - Abnormal; Notable for the following:    RBC 3.79 (*)    Hemoglobin 11.6 (*)    HCT 34.2 (*)    RDW 15.2 (*)    Platelets 135 (*)    All other components within normal limits  GLUCOSE, CAPILLARY - Abnormal; Notable for the following:    Glucose-Capillary 199 (*)    All other components within normal limits  GLUCOSE, CAPILLARY - Abnormal; Notable for the following:    Glucose-Capillary 176 (*)    All other components within normal limits  GLUCOSE, CAPILLARY - Abnormal; Notable for the following:    Glucose-Capillary 140 (*)    All other components within normal limits  GLUCOSE, CAPILLARY - Abnormal; Notable for the following:    Glucose-Capillary 102 (*)    All other components within normal limits  GLUCOSE, CAPILLARY - Abnormal; Notable for the following:    Glucose-Capillary 45 (*)    All other components within normal limits  GLUCOSE, CAPILLARY - Abnormal; Notable for the following:  Glucose-Capillary 106 (*)    All other components within normal limits  BASIC METABOLIC PANEL - Abnormal; Notable for the following:    Sodium 134 (*)    Glucose, Bld 195 (*)    BUN 72 (*)    Creatinine, Ser 2.53 (*)    Calcium 8.2 (*)    GFR calc non Af Amer 22 (*)    GFR calc Af Amer 25 (*)    All other components within normal limits  GLUCOSE, CAPILLARY - Abnormal; Notable for the  following:    Glucose-Capillary 181 (*)    All other components within normal limits  GLUCOSE, CAPILLARY - Abnormal; Notable for the following:    Glucose-Capillary 160 (*)    All other components within normal limits  GLUCOSE, CAPILLARY - Abnormal; Notable for the following:    Glucose-Capillary 351 (*)    All other components within normal limits  BASIC METABOLIC PANEL - Abnormal; Notable for the following:    Sodium 134 (*)    CO2 20 (*)    Glucose, Bld 315 (*)    BUN 53 (*)    Creatinine, Ser 1.90 (*)    Calcium 8.6 (*)    GFR calc non Af Amer 31 (*)    GFR calc Af Amer 36 (*)    All other components within normal limits  GLUCOSE, CAPILLARY - Abnormal; Notable for the following:    Glucose-Capillary 233 (*)    All other components within normal limits  CULTURE, BLOOD (ROUTINE X 2)  CULTURE, BLOOD (ROUTINE X 2)  C DIFFICILE QUICK SCREEN W PCR REFLEX  GASTROINTESTINAL PANEL BY PCR, STOOL (REPLACES STOOL CULTURE)  LIPASE, BLOOD  PROCALCITONIN  GLUCOSE, CAPILLARY  GLUCOSE, CAPILLARY  GLUCOSE, CAPILLARY  LACTIC ACID, PLASMA  PROCALCITONIN   ____________________________________________  EKG  ED ECG REPORT I, Eula Listen, the attending physician, personally viewed and interpreted this ECG.   Date: 10/17/2016  EKG Time: 1920  Rate: 67  Rhythm: Poor baseline tracing; this is an irregular rhythm although there are some areas that do look like P waves.  Axis: normal  Intervals:none  ST&T Change: No STEMI  ____________________________________________  RADIOLOGY  No results found.  ____________________________________________   PROCEDURES  Procedure(s) performed: None  Procedures  Critical Care performed: Yes ____________________________________________   INITIAL IMPRESSION / ASSESSMENT AND PLAN / ED COURSE  Pertinent labs & imaging results that were available during my care of the patient were reviewed by me and considered in my medical  decision making (see chart for details).  83 y.o. male with a history of remote colon cancer status post resection presenting with 3-4 days of nausea vomiting and diarrhea. Overall, the patient's vital signs are concerning for markedly hypotension, this this is improving without any intervention. This may be from hypovolemia but I am also concerned about sepsis. A viral or bacterial GI illness, or partial small bowel extraction, are also possible. We will get a CT of the abdomen for further evaluation. I will order empiric antibiotics. We will give the patient intravenous fluids and continue to monitor his hypotension. The patient will require admission to the hospital for further evaluation and treatment.  CRITICAL CARE Performed by: Eula Listen   Total critical care time: 40 minutes  Critical care time was exclusive of separately billable procedures and treating other patients.  Critical care was necessary to treat or prevent imminent or life-threatening deterioration.  Critical care was time spent personally by me on the following activities: development of treatment  plan with patient and/or surrogate as well as nursing, discussions with consultants, evaluation of patient's response to treatment, examination of patient, obtaining history from patient or surrogate, ordering and performing treatments and interventions, ordering and review of laboratory studies, ordering and review of radiographic studies, pulse oximetry and re-evaluation of patient's condition.   ____________________________________________  FINAL CLINICAL IMPRESSION(S) / ED DIAGNOSES  Final diagnoses:  Diabetic ketoacidosis without coma associated with type 2 diabetes mellitus (HCC)  Acute renal failure, unspecified acute renal failure type (HCC)  Hypotension, unspecified hypotension type  Dehydration  Enteritis         NEW MEDICATIONS STARTED DURING THIS VISIT:  Discharge Medication List as of  10/19/2016  1:46 PM    START taking these medications   Details  zinc oxide (BALMEX) 11.3 % CREA cream Apply 1 application topically 2 (two) times daily., Starting Wed 10/19/2016, Normal          Eula Listen, MD 10/19/16 2312

## 2016-10-17 NOTE — ED Notes (Signed)
RT called to collect VBG.

## 2016-10-17 NOTE — ED Notes (Signed)
Date and time results received: 10/17/16 2110  Test: Lactic Acid Critical Value: 4.6  Name of Provider Notified: Mariea Clonts, MD  Orders Received? Or Actions Taken?: No orders received or actions taken

## 2016-10-17 NOTE — ED Triage Notes (Signed)
Patient to ER for low BP (50's/30's at home). Patient states he has had "constant" diarrhea last couple of days. Had one episode of vomiting on Saturday.

## 2016-10-17 NOTE — H&P (Signed)
[facility] NAME: [name] Good    MR#:  [id number]  DATE OF BIRTH:  03/10/1931  DATE OF ADMISSION:  10/17/2016  PRIMARY CARE PHYSICIAN: Juline Patch, MD   REQUESTING/REFERRING PHYSICIAN: Mariea Clonts, MD  CHIEF COMPLAINT:   Chief Complaint  Patient presents with  . Hypotension  . Emesis  . Diarrhea    HISTORY OF PRESENT ILLNESS:  [name] Good  is a 83 y.o. male who presents with Nausea vomiting and diarrhea for the past 3 days. Patient presented here with profound hypotension, and was found to be in DKA. Workup shows likely viral enteritis, and redness noted on CT scan. C. difficile and GI panel was negative. Hospitalists were called for admission.  PAST MEDICAL HISTORY:   Past Medical History:  Diagnosis Date  . Arthritis   . CAD (coronary artery disease)   . Cancer ([place])   . CHF (congestive heart failure) ([place])   . Diabetes mellitus without complication ([place])   . Hyperlipidemia   . Hypertension   . Restless leg 09/01/2016  . Stroke ([place])   . Venous insufficiency     PAST SURGICAL HISTORY:   Past Surgical History:  Procedure Laterality Date  . BACK SURGERY    . COLONOSCOPY W/ BIOPSIES AND MANOMETRY CATHETER PLACEMENT  2014   normal  . EYE SURGERY Bilateral    cataract  . HERNIA REPAIR    . SHOULDER SURGERY Right   . SKIN CANCER EXCISION     nose, ear, arm, and face    SOCIAL HISTORY:   Social History  Substance Use Topics  . Smoking status: Former Research scientist (life sciences)  . Smokeless tobacco: Current User    Types: Chew  . Alcohol use No    FAMILY HISTORY:   Family History  Problem Relation Age of Onset  . Heart disease Father   . Hypertension Father   . Stroke Father     DRUG ALLERGIES:   Allergies  Allergen Reactions  . Biaxin [Clarithromycin] Nausea Only    MEDICATIONS AT HOME:   Prior to Admission medications   Medication Sig Start Date End Date Taking? Authorizing Provider  aspirin EC 325  MG tablet Take 325 mg by mouth daily.   Yes [provider]  atorvastatin (LIPITOR) 20 MG tablet Take 10 mg by mouth daily.   Yes [provider]  diltiazem (CARDIZEM CD) 180 MG 24 hr capsule Take 1 capsule (180 mg total) by mouth daily. 10/09/15  Yes Sainani, Belia Heman, MD  docusate sodium (COLACE) 100 MG capsule Take 100 mg by mouth daily.   Yes [provider]  donepezil (ARICEPT) 10 MG tablet Take 10 mg by mouth at bedtime.   Yes [provider]  doxazosin (CARDURA) 4 MG tablet Take 4 mg by mouth at bedtime.    Yes [provider]  furosemide (LASIX) 80 MG tablet Take 1-2 tablets (80-160 mg total) by mouth 2 (two) times daily. Take 80mg  twice daily on Sunday, Monday, Wednesday, Friday, and Saturday. Take 160mg  twice daily on Tuesday and Thursday. 01/15/16  Yes Juline Patch, MD  insulin glargine (LANTUS) 100 UNIT/ML injection Inject 15 Units into the skin at bedtime. Dr Enzo Montgomery    Yes [provider]  losartan (COZAAR) 50 MG tablet Take 50 mg by mouth daily.   Yes [provider]  Melatonin 3 MG TABS Take 3 mg by mouth every other day.    Yes [provider]  meloxicam (MOBIC) 15 MG tablet Take 7.5 mg by mouth daily.   Yes [provider]  metFORMIN (GLUCOPHAGE) 1000 MG tablet Take 1,000 mg by mouth 2 (two) times daily with a meal.    Yes [provider]  pramipexole (MIRAPEX) 1 MG tablet Take 1.5 tablets (1.5 mg total) by mouth at bedtime. Dr Enzo Montgomery 10/11/15  Yes Wieting, Richard, MD  rOPINIRole (REQUIP) 2 MG tablet Take 2 mg by mouth at bedtime.   Yes [provider]  spironolactone (ALDACTONE) 25 MG tablet Take 25 mg by mouth daily.    Yes [provider]  ipratropium-albuterol (DUONEB) 0.5-2.5 (3) MG/3ML SOLN Take 3 mLs by nebulization every 6 (six) hours. Patient not taking: Reported on 10/30/2015 10/11/15   Loletha Grayer, MD    REVIEW OF SYSTEMS:  Review of Systems  Constitutional:  Positive for malaise/fatigue. Negative for chills, fever and weight loss.  HENT: Negative for ear pain, hearing loss and tinnitus.   Eyes: Negative for blurred vision, double vision, pain and redness.  Respiratory: Negative for cough, hemoptysis and shortness of breath.   Cardiovascular: Negative for chest pain, palpitations, orthopnea and leg swelling.  Gastrointestinal: Positive for diarrhea, nausea and vomiting. Negative for abdominal pain and constipation.  Genitourinary: Negative for dysuria, frequency and hematuria.  Musculoskeletal: Negative for back pain, joint pain and neck pain.  Skin:       No acne, rash, or lesions  Neurological: Positive for weakness. Negative for dizziness, tremors and focal weakness.  Endo/Heme/Allergies: Negative for polydipsia. Does not bruise/bleed easily.  Psychiatric/Behavioral: Negative for depression. The patient is not nervous/anxious and does not have insomnia.      VITAL SIGNS:   Vitals:   10/17/16 1918 10/17/16 2100 10/17/16 2230 10/17/16 2350  BP:  (!) 102/39 (!) 81/46 (!) 73/53  Pulse:  64 68 66  Resp:  12 (!) 24 13  Temp:      TempSrc:      SpO2:  92% 98% 91%  Weight: 68 kg (150 lb)     Height: 5\' 10"  (1.778 m)      Wt Readings from Last 3 Encounters:  10/17/16 68 kg (150 lb)  09/01/16 68 kg (150 lb)  10/11/15 79 kg (174 lb 3.2 oz)    PHYSICAL EXAMINATION:  Physical Exam  Vitals reviewed. Constitutional: He is oriented to person, place, and time. He appears well-developed and well-nourished. No distress.  HENT:  Head: Normocephalic and atraumatic.  Dry mucous membranes  Eyes: Conjunctivae and EOM are normal. Pupils are equal, round, and reactive to light. No scleral icterus.  Neck: Normal range of motion. Neck supple. No JVD present. No thyromegaly present.  Cardiovascular: Normal rate, regular rhythm and intact distal pulses.  Exam reveals no gallop and no friction rub.   Murmur heard. Respiratory: Effort normal and breath  sounds normal. No respiratory distress. He has no wheezes. He has no rales.  GI: Soft. Bowel sounds are normal. He exhibits no distension. There is no tenderness.  Musculoskeletal: Normal range of motion. He exhibits no edema.  No arthritis, no gout  Lymphadenopathy:    He has no cervical adenopathy.  Neurological: He is alert and oriented to person, place, and time. No cranial nerve deficit.  No dysarthria, no aphasia  Skin: Skin is warm and dry. No rash noted. No erythema.  Psychiatric: He has a normal mood and affect. His behavior is normal. Judgment and thought content normal.    LABORATORY PANEL:   CBC  Recent  Labs Lab 10/17/16 1942  WBC 9.2  HGB 12.6*  HCT 38.6*  PLT 147*   ------------------------------------------------------------------------------------------------------------------  Chemistries   Recent Labs Lab 10/17/16 1942  NA 131*  K 5.6*  CL 93*  CO2 17*  GLUCOSE 461*  BUN 94*  CREATININE 3.72*  CALCIUM 9.2  AST 27  ALT 12*  ALKPHOS 89  BILITOT 0.9   ------------------------------------------------------------------------------------------------------------------  Cardiac Enzymes No results for input(s): TROPONINI in the last 168 hours. ------------------------------------------------------------------------------------------------------------------  RADIOLOGY:  Ct Abdomen Pelvis Wo Contrast  Result Date: 10/17/2016 CLINICAL DATA:  Nausea and vomiting. Diarrhea for couple of days. Low blood pressure. Renal insufficiency. EXAM: CT ABDOMEN AND PELVIS WITHOUT CONTRAST TECHNIQUE: Multidetector CT imaging of the abdomen and pelvis was performed following the standard protocol without IV contrast. COMPARISON:  10/08/2008 FINDINGS: Lower chest: Mild atelectasis in the lung bases. Coronary artery and aortic calcifications. Pericardial calcifications. Pectus excavatum. Hepatobiliary: No focal liver abnormality is seen. No gallstones, gallbladder wall  thickening, or biliary dilatation. Pancreas: There is a cystic lesion which appears to arise from the uncinate process of the pancreas and measures about 2.5 x 3.2 cm. This is enlarging since the previous study (2 cm diameter previously). Differential diagnosis would include a mucinous cystic neoplasm. No mural nodules, septation, or wall thickening identified. No pancreatic duct dilatation. Spleen: Normal in size without focal abnormality. Adrenals/Urinary Tract: No adrenal gland nodules. Kidneys are symmetrical in size. No hydronephrosis or hydroureter. No stones identified. Bladder wall is diffusely thickened, suggesting outlet obstruction or cystitis. Stomach/Bowel: Stomach is unremarkable. Small bowel are decompressed. Contrast material flows through to the colon without evidence of small bowel obstruction. Decompression of the small bowel limits evaluation but some small bowel loops appear to have diffuse wall thickening which may indicate changes due to enteritis. Contrast material flows through the colon to the rectum without evidence of colonic obstruction. Diverticulosis of the sigmoid colon with wall thickening probably due to muscular hypertrophy. No inflammatory changes to suggest diverticulitis. Appendix is not identified. Vascular/Lymphatic: Diffuse vascular calcification throughout the abdominal aorta and major branch vessels. No significant lymphadenopathy. Reproductive: Prostate gland is enlarged, measuring 4.8 cm. Other: Postoperative right inguinal hernia repair. Small left inguinal hernia containing fat. Surgical scarring along the midline in the anterior abdominal wall. No free air or free fluid in the abdomen. Musculoskeletal: Degenerative changes throughout the lumbar spine. Diffuse demineralization suggesting osteoporosis. Compression fractures of T9 and T10 vertebrae. These have progressed since the previous two view chest from 10/10/2014. The cortex is irregular and linear lucencies are  present suggesting probable acute fractures. IMPRESSION: 1. 3.2 cm diameter cystic lesion in the pancreas, increasing from previous CT. 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 In a patient greater than 48 years of age, consider follow-up CT in 2 years or no follow-up, depending on patient's clinical condition. 2. Suggestion of small bowel wall thickening possibly indicating enteritis or possibly due to under distention. No evidence of bowel obstruction. Diverticulosis of sigmoid colon without evidence of diverticulitis. 3. Aortic atherosclerosis. 4. Bladder wall thickening could indicate cystitis or bladder outlet obstruction. Prostate gland is enlarged. 5. Diffuse bone demineralization suggesting osteoporosis. Progressing compression fractures seen at T9 and T10, probably acute. Electronically Signed   By: Lucienne Capers M.D.   On: 10/17/2016 23:29    EKG:   Orders placed or performed during the hospital encounter of 10/17/16  . EKG 12-Lead  . EKG 12-Lead  . EKG 12-Lead  . EKG 12-Lead  . EKG 12-Lead  .  EKG 12-Lead  . ED EKG 12-Lead  . ED EKG 12-Lead  . ED EKG  . ED EKG  . EKG 12-Lead  . EKG 12-Lead    IMPRESSION AND PLAN:  Principal Problem:   DKA (diabetic ketoacidoses) ([place]) - admit to ICU, IV insulin in Place, fluids in Place, intensivist consult and labs and other treatment per DKA admission order set Active Problems:   AKI (acute kidney injury) ([place]) - due to profound dehydration, IV fluids above, avoid nephrotoxins   Enteritis - suspect possible viral etiology, however we will leave IV antibiotics in place for now   HTN (hypertension) - hypotensive, hold antihypertensives at this time   CAD (coronary artery disease) - continue home meds except those might lower his blood pressure, these will need to be restarted once his blood pressure improves   HLD (hyperlipidemia) - continue home meds  All the records are reviewed and case discussed with ED provider. Management  plans discussed with the patient and/or family.  DVT PROPHYLAXIS: SubQ heparin  GI PROPHYLAXIS: None  ADMISSION STATUS: Inpatient  CODE STATUS: DNR Code Status History    Date Active Date Inactive Code Status Order ID Comments User Context   09/01/2016 11:50 PM 09/04/2016  5:29 PM DNR [id number]  Lance Coon, MD Inpatient   09/01/2016  9:20 PM 09/01/2016 11:50 PM DNR [id number]  Merlyn Lot, MD ED   10/05/2015 12:08 AM 10/11/2015  2:18 PM Full Code [id number]  Mikael Spray, NP ED    Questions for Most Recent Historical Code Status (Order [id number])    Question Answer Comment   In the event of cardiac or respiratory ARREST Do not call a "code blue"    In the event of cardiac or respiratory ARREST Do not perform Intubation, CPR, defibrillation or ACLS    In the event of cardiac or respiratory ARREST Use medication by any route, position, wound care, and other measures to relive pain and suffering. May use oxygen, suction and manual treatment of airway obstruction as needed for comfort.       TOTAL CRITICAL CARE TIME TAKING CARE OF THIS PATIENT: 45 minutes.   Jacson Rapaport [place] 10/17/2016, 11:57 PM  [facility]  Office  [phone]  CC: Primary care physician; Juline Patch, MD  Note:  This document was prepared using Dragon voice recognition software and may include unintentional dictation errors.

## 2016-10-18 ENCOUNTER — Inpatient Hospital Stay: Payer: Medicare Other

## 2016-10-18 DIAGNOSIS — N179 Acute kidney failure, unspecified: Principal | ICD-10-CM

## 2016-10-18 DIAGNOSIS — K529 Noninfective gastroenteritis and colitis, unspecified: Secondary | ICD-10-CM

## 2016-10-18 DIAGNOSIS — E1010 Type 1 diabetes mellitus with ketoacidosis without coma: Secondary | ICD-10-CM

## 2016-10-18 LAB — BASIC METABOLIC PANEL
ANION GAP: 12 (ref 5–15)
ANION GAP: 8 (ref 5–15)
ANION GAP: 8 (ref 5–15)
Anion gap: 9 (ref 5–15)
BUN: 87 mg/dL — ABNORMAL HIGH (ref 6–20)
BUN: 77 mg/dL — ABNORMAL HIGH (ref 6–20)
BUN: 72 mg/dL — ABNORMAL HIGH (ref 6–20)
BUN: 65 mg/dL — ABNORMAL HIGH (ref 6–20)
CHLORIDE: 101 mmol/L (ref 101–111)
CHLORIDE: 104 mmol/L (ref 101–111)
CHLORIDE: 104 mmol/L (ref 101–111)
CO2: 22 mmol/L (ref 22–32)
CO2: 21 mmol/L — AB (ref 22–32)
CO2: 22 mmol/L (ref 22–32)
CO2: 19 mmol/L — AB (ref 22–32)
CREATININE: 3.35 mg/dL — AB (ref 0.61–1.24)
Calcium: 8.6 mg/dL — ABNORMAL LOW (ref 8.9–10.3)
Calcium: 8.4 mg/dL — ABNORMAL LOW (ref 8.9–10.3)
Calcium: 8.2 mg/dL — ABNORMAL LOW (ref 8.9–10.3)
Calcium: 8.4 mg/dL — ABNORMAL LOW (ref 8.9–10.3)
Chloride: 105 mmol/L (ref 101–111)
Creatinine, Ser: 2.72 mg/dL — ABNORMAL HIGH (ref 0.61–1.24)
Creatinine, Ser: 2.53 mg/dL — ABNORMAL HIGH (ref 0.61–1.24)
Creatinine, Ser: 2.39 mg/dL — ABNORMAL HIGH (ref 0.61–1.24)
GFR calc Af Amer: 25 mL/min — ABNORMAL LOW (ref >60)
GFR calc non Af Amer: 16 mL/min — ABNORMAL LOW (ref >60)
GFR calc non Af Amer: 20 mL/min — ABNORMAL LOW (ref >60)
GFR calc non Af Amer: 23 mL/min — ABNORMAL LOW (ref >60)
GFR, EST AFRICAN AMERICAN: 18 mL/min — AB (ref >60)
GFR, EST AFRICAN AMERICAN: 23 mL/min — AB (ref >60)
GFR, EST AFRICAN AMERICAN: 27 mL/min — AB (ref >60)
GFR, EST NON AFRICAN AMERICAN: 22 mL/min — AB (ref >60)
GLUCOSE: 195 mg/dL — AB (ref 65–99)
Glucose, Bld: 213 mg/dL — ABNORMAL HIGH (ref 65–99)
Glucose, Bld: 83 mg/dL (ref 65–99)
Glucose, Bld: 183 mg/dL — ABNORMAL HIGH (ref 65–99)
POTASSIUM: 4.2 mmol/L (ref 3.5–5.1)
POTASSIUM: 4.5 mmol/L (ref 3.5–5.1)
POTASSIUM: 4.6 mmol/L (ref 3.5–5.1)
Potassium: 4.4 mmol/L (ref 3.5–5.1)
Sodium: 135 mmol/L (ref 135–145)
Sodium: 134 mmol/L — ABNORMAL LOW (ref 135–145)
Sodium: 134 mmol/L — ABNORMAL LOW (ref 135–145)
Sodium: 132 mmol/L — ABNORMAL LOW (ref 135–145)

## 2016-10-18 LAB — GLUCOSE, CAPILLARY
GLUCOSE-CAPILLARY: 238 mg/dL — AB (ref 65–99)
GLUCOSE-CAPILLARY: 140 mg/dL — AB (ref 65–99)
GLUCOSE-CAPILLARY: 102 mg/dL — AB (ref 65–99)
GLUCOSE-CAPILLARY: 92 mg/dL (ref 65–99)
GLUCOSE-CAPILLARY: 65 mg/dL (ref 65–99)
GLUCOSE-CAPILLARY: 106 mg/dL — AB (ref 65–99)
Glucose-Capillary: 160 mg/dL — ABNORMAL HIGH (ref 65–99)
Glucose-Capillary: 176 mg/dL — ABNORMAL HIGH (ref 65–99)
Glucose-Capillary: 181 mg/dL — ABNORMAL HIGH (ref 65–99)
Glucose-Capillary: 199 mg/dL — ABNORMAL HIGH (ref 65–99)
Glucose-Capillary: 361 mg/dL — ABNORMAL HIGH (ref 65–99)
Glucose-Capillary: 45 mg/dL — ABNORMAL LOW (ref 65–99)
Glucose-Capillary: 75 mg/dL (ref 65–99)

## 2016-10-18 LAB — CBC
HCT: 34.2 % — ABNORMAL LOW (ref 40.0–52.0)
HEMOGLOBIN: 11.6 g/dL — AB (ref 13.0–18.0)
MCH: 30.6 pg (ref 26.0–34.0)
MCHC: 33.9 g/dL (ref 32.0–36.0)
MCV: 90.3 fL (ref 80.0–100.0)
Platelets: 135 10*3/uL — ABNORMAL LOW (ref 150–440)
RBC: 3.79 MIL/uL — AB (ref 4.40–5.90)
RDW: 15.2 % — ABNORMAL HIGH (ref 11.5–14.5)
WBC: 8.6 10*3/uL (ref 3.8–10.6)

## 2016-10-18 LAB — MRSA PCR SCREENING: MRSA BY PCR: POSITIVE — AB

## 2016-10-18 LAB — LACTIC ACID, PLASMA
Lactic Acid, Venous: 3.3 mmol/L (ref 0.5–1.9)
Lactic Acid, Venous: 1.2 mmol/L (ref 0.5–1.9)

## 2016-10-18 LAB — PROCALCITONIN: Procalcitonin: 0.14 ng/mL

## 2016-10-18 MED ORDER — ROPINIROLE HCL 1 MG PO TABS
2.0000 mg | ORAL_TABLET | Freq: Every day | ORAL | Status: DC
  Administered 2016-10-18 (×2): 2 mg via ORAL
  Filled 2016-10-18 (×2): qty 2

## 2016-10-18 MED ORDER — PRAMIPEXOLE DIHYDROCHLORIDE 1 MG PO TABS
1.5000 mg | ORAL_TABLET | Freq: Every day | ORAL | Status: DC
  Administered 2016-10-18 (×2): 1.5 mg via ORAL
  Filled 2016-10-18: qty 2
  Filled 2016-10-18: qty 1
  Filled 2016-10-18: qty 2

## 2016-10-18 MED ORDER — INSULIN ASPART 100 UNIT/ML ~~LOC~~ SOLN
0.0000 [IU] | Freq: Three times a day (TID) | SUBCUTANEOUS | Status: DC
  Administered 2016-10-18: 2 [IU] via SUBCUTANEOUS
  Administered 2016-10-19: 9 [IU] via SUBCUTANEOUS
  Administered 2016-10-19: 3 [IU] via SUBCUTANEOUS
  Filled 2016-10-18: qty 9

## 2016-10-18 MED ORDER — HEPARIN SODIUM (PORCINE) 5000 UNIT/ML IJ SOLN
5000.0000 [IU] | Freq: Three times a day (TID) | INTRAMUSCULAR | Status: DC
  Administered 2016-10-18 – 2016-10-19 (×4): 5000 [IU] via SUBCUTANEOUS
  Filled 2016-10-18 (×4): qty 1

## 2016-10-18 MED ORDER — SODIUM CHLORIDE 0.9 % IV SOLN
INTRAVENOUS | Status: DC
  Administered 2016-10-18 – 2016-10-19 (×2): via INTRAVENOUS

## 2016-10-18 MED ORDER — MUPIROCIN 2 % EX OINT
1.0000 "application " | TOPICAL_OINTMENT | Freq: Two times a day (BID) | CUTANEOUS | Status: DC
  Administered 2016-10-18 – 2016-10-19 (×3): 1 via NASAL
  Filled 2016-10-18 (×2): qty 22

## 2016-10-18 MED ORDER — INSULIN ASPART 100 UNIT/ML ~~LOC~~ SOLN
0.0000 [IU] | Freq: Every day | SUBCUTANEOUS | Status: DC
  Filled 2016-10-18: qty 2

## 2016-10-18 MED ORDER — DEXTROSE 50 % IV SOLN
INTRAVENOUS | Status: AC
  Filled 2016-10-18: qty 50

## 2016-10-18 MED ORDER — ATORVASTATIN CALCIUM 10 MG PO TABS
10.0000 mg | ORAL_TABLET | Freq: Every day | ORAL | Status: DC
  Administered 2016-10-18: 10 mg via ORAL
  Filled 2016-10-18: qty 1

## 2016-10-18 MED ORDER — DEXTROSE 50 % IV SOLN
25.0000 mL | Freq: Once | INTRAVENOUS | Status: AC
  Administered 2016-10-18: 25 mL via INTRAVENOUS

## 2016-10-18 MED ORDER — CIPROFLOXACIN IN D5W 400 MG/200ML IV SOLN
400.0000 mg | INTRAVENOUS | Status: DC
  Administered 2016-10-19: 400 mg via INTRAVENOUS
  Filled 2016-10-18 (×4): qty 200

## 2016-10-18 MED ORDER — SODIUM CHLORIDE 0.9 % IV SOLN: INTRAVENOUS | Status: DC

## 2016-10-18 MED ORDER — ASPIRIN EC 325 MG PO TBEC
325.0000 mg | DELAYED_RELEASE_TABLET | Freq: Every day | ORAL | Status: DC
  Administered 2016-10-18 – 2016-10-19 (×2): 325 mg via ORAL
  Filled 2016-10-18 (×2): qty 1

## 2016-10-18 MED ORDER — ONDANSETRON HCL 4 MG/2ML IJ SOLN: 4.0000 mg | Freq: Four times a day (QID) | INTRAMUSCULAR | Status: DC | PRN

## 2016-10-18 MED ORDER — CHLORHEXIDINE GLUCONATE CLOTH 2 % EX PADS
6.0000 | MEDICATED_PAD | Freq: Every day | CUTANEOUS | Status: DC
  Administered 2016-10-18 – 2016-10-19 (×2): 6 via TOPICAL

## 2016-10-18 MED ORDER — DONEPEZIL HCL 5 MG PO TABS
10.0000 mg | ORAL_TABLET | Freq: Every day | ORAL | Status: DC
  Administered 2016-10-18: 10 mg via ORAL
  Filled 2016-10-18: qty 2

## 2016-10-18 MED ORDER — INSULIN GLARGINE 100 UNIT/ML ~~LOC~~ SOLN
8.0000 [IU] | Freq: Every day | SUBCUTANEOUS | Status: DC
  Administered 2016-10-18 (×2): 8 [IU] via SUBCUTANEOUS
  Filled 2016-10-18 (×4): qty 0.08

## 2016-10-18 MED ORDER — INSULIN REGULAR HUMAN 100 UNIT/ML IJ SOLN
INTRAMUSCULAR | Status: DC
  Filled 2016-10-18: qty 2.5

## 2016-10-18 MED ORDER — DEXTROSE-NACL 5-0.45 % IV SOLN
INTRAVENOUS | Status: DC
  Administered 2016-10-18: 03:00:00 via INTRAVENOUS

## 2016-10-18 MED ORDER — SODIUM CHLORIDE 0.9 % IV SOLN
INTRAVENOUS | Status: AC
  Administered 2016-10-18: 03:00:00 via INTRAVENOUS

## 2016-10-18 MED ORDER — METRONIDAZOLE IN NACL 5-0.79 MG/ML-% IV SOLN
500.0000 mg | Freq: Two times a day (BID) | INTRAVENOUS | Status: DC
  Administered 2016-10-18 – 2016-10-19 (×3): 500 mg via INTRAVENOUS
  Filled 2016-10-18 (×6): qty 100

## 2016-10-18 NOTE — ED Notes (Signed)
Date and time results received: 10/18/16 0046  Test: Lactic Acid Critical Value: 3.3  Name of Provider Notified: Owens Shark, MD  Orders Received? Or Actions Taken?: None

## 2016-10-18 NOTE — Consult Note (Signed)
Name: [name] Good MRN: [id number] DOB: 08/26/1930    ADMISSION DATE:  10/17/2016 CONSULTATION DATE: 10/17/2016  REFERRING MD :  Dr. Jannifer Franklin   CHIEF COMPLAINT: Emesis, Diarrhea, and Hypotension  BRIEF PATIENT DESCRIPTION:  83 yo male admitted 05/7 with DKA, viral vs. bacterial enteritis, and hypotension  SIGNIFICANT EVENTS  05/7-Pt admitted to [facility] Unit   STUDIES:  CT Abd and Pelvis 05/7>>3.2 cm diameter cystic lesion in the pancreas, increasing from previous CT. 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 In a patient greater than 56 years of age, consider follow-up CT in 2 years or no follow-up, depending on patient's clinical condition. Suggestion of small bowel wall thickening possibly indicating enteritis or possibly due to under distention. No evidence of bowel obstruction. Diverticulosis of sigmoid colon without evidence of diverticulitis. Aortic atherosclerosis. Bladder wall thickening could indicate cystitis or bladder outlet obstruction. Prostate gland is enlarged. Diffuse bone demineralization suggesting osteoporosis. Progressing compression fractures seen at T9 and T10, probably acute.  HISTORY OF PRESENT ILLNESS:   This is an 83 yo male with a PMH of Venous Insufficiency, Stroke, Restless Leg Syndrome, HTN, Hyperlipidemia, Diabetes Mellitus, CHF, CAD, and Arthritis.  He presented to [facility] ER 05/8 with nausea, vomiting, and diarrhea onset 3 days ago 05/4.  Upon arrival to the ER the pt was profoundly hypotensive and lab results revealed Na+ 131, K+ 5.6, serum glucose 461, creatinine 3.72, anion gap 21, lactic acid 4.6, venous abg results pH 7.15, PCO2 45, and sodium bicarb 15.7 ruling in for DKA.  CT Abd Pelvis revealed possible enteritis, Cdiff and GI panel negative.  He was subsequently admitted to the [facility] Unit by hospitalist team for further workup and treatment.   PAST MEDICAL HISTORY :   has a past medical history of Arthritis; CAD (coronary artery  disease); Cancer [facility]); CHF (congestive heart failure) ([place]); Diabetes mellitus without complication ([place]); Hyperlipidemia; Hypertension; Restless leg (09/01/2016); Stroke [facility]); and Venous insufficiency.  has a past surgical history that includes Shoulder surgery (Right); Back surgery; Hernia repair; Colonoscopy w/ biopsies and manometry catheter placement (2014); Skin cancer excision; and Eye surgery (Bilateral). Prior to Admission medications   Medication Sig Start Date End Date Taking? Authorizing Provider  aspirin EC 325 MG tablet Take 325 mg by mouth daily.   Yes [provider]  atorvastatin (LIPITOR) 20 MG tablet Take 10 mg by mouth daily.   Yes [provider]  diltiazem (CARDIZEM CD) 180 MG 24 hr capsule Take 1 capsule (180 mg total) by mouth daily. 10/09/15  Yes Sainani, Belia Heman, MD  docusate sodium (COLACE) 100 MG capsule Take 100 mg by mouth daily.   Yes [provider]  donepezil (ARICEPT) 10 MG tablet Take 10 mg by mouth at bedtime.   Yes [provider]  doxazosin (CARDURA) 4 MG tablet Take 4 mg by mouth at bedtime.    Yes [provider]  furosemide (LASIX) 80 MG tablet Take 1-2 tablets (80-160 mg total) by mouth 2 (two) times daily. Take 80mg  twice daily on Sunday, Monday, Wednesday, Friday, and Saturday. Take 160mg  twice daily on Tuesday and Thursday. 01/15/16  Yes Juline Patch, MD  insulin glargine (LANTUS) 100 UNIT/ML injection Inject 15 Units into the skin at bedtime. Dr Enzo Montgomery    Yes [provider]  losartan (COZAAR) 50 MG tablet Take 50 mg by mouth daily.   Yes [provider]  Melatonin 3 MG TABS Take 3 mg by mouth every other day.  Yes [provider]  meloxicam (MOBIC) 15 MG tablet Take 7.5 mg by mouth daily.   Yes [provider]  metFORMIN (GLUCOPHAGE) 1000 MG tablet Take 1,000 mg by mouth 2 (two) times daily with a meal.    Yes [provider]  pramipexole (MIRAPEX) 1 MG tablet  Take 1.5 tablets (1.5 mg total) by mouth at bedtime. Dr Enzo Montgomery 10/11/15  Yes Wieting, Richard, MD  rOPINIRole (REQUIP) 2 MG tablet Take 2 mg by mouth at bedtime.   Yes [provider]  spironolactone (ALDACTONE) 25 MG tablet Take 25 mg by mouth daily.    Yes [provider]  ipratropium-albuterol (DUONEB) 0.5-2.5 (3) MG/3ML SOLN Take 3 mLs by nebulization every 6 (six) hours. Patient not taking: Reported on 10/30/2015 10/11/15   Loletha Grayer, MD   Allergies  Allergen Reactions  . Biaxin [Clarithromycin] Nausea Only    FAMILY HISTORY:  family history includes Heart disease in his father; Hypertension in his father; Stroke in his father. SOCIAL HISTORY:  reports that he has quit smoking. His smokeless tobacco use includes Chew. He reports that he does not drink alcohol or use drugs.  REVIEW OF SYSTEMS: Positives in BOLD  Constitutional: Negative for fever, chills, weight loss, malaise/fatigue and diaphoresis.  HENT: Negative for hearing loss, ear pain, nosebleeds, congestion, sore throat, neck pain, tinnitus and ear discharge.  Eyes: Negative for blurred vision, double vision, photophobia, pain, discharge and redness.  Respiratory: Negative for cough, hemoptysis, sputum production, shortness of breath, wheezing and stridor.   Cardiovascular: Negative for chest pain, palpitations, orthopnea, claudication, leg swelling and PND.  Gastrointestinal: heartburn, nausea, vomiting, abdominal pain, diarrhea, constipation, blood in stool and melena.  Genitourinary: Negative for dysuria, urgency, frequency, hematuria and flank pain.  Musculoskeletal: Negative for myalgias, back pain, joint pain and falls.  Skin: Negative for itching and rash.  Neurological: Negative for dizziness, tingling, tremors, sensory change, speech change, focal weakness, seizures, loss of consciousness, weakness and headaches.  Endo/Heme/Allergies: Negative for environmental allergies and polydipsia. Does not  bruise/bleed easily.  SUBJECTIVE:  Pt stating he is having bilateral shoulder pain no other complaints at this time  VITAL SIGNS: Temp:  [97.9 F (36.6 C)-98.3 F (36.8 C)] 98.3 F (36.8 C) (05/08 0250) Pulse Rate:  [64-79] 71 (05/08 0400) Resp:  [12-24] 16 (05/08 0400) BP: (60-113)/(34-78) 107/48 (05/08 0400) SpO2:  [89 %-98 %] 92 % (05/08 0400) Weight:  [67.3 kg (148 lb 5.9 oz)-68 kg (150 lb)] 67.3 kg (148 lb 5.9 oz) (05/08 0250)  PHYSICAL EXAMINATION: General: well developed Caucasian male, NAD Neuro: lethargic, disoriented to time, follows commands HEENT: supple, no JVD Cardiovascular: irregular, irregular, murmur, no R/G Lungs: diminished throughout; no rhonchi, crackles, or wheezes, even, non labored Abdomen: +BS x4, soft, non tender, non distended Musculoskeletal: normal bulk and tone, no edema Skin: intact no rashes or lesions    Recent Labs Lab 10/17/16 1942 10/18/16 0241  NA 131* 135  K 5.6* 4.4  CL 93* 101  CO2 17* 22  BUN 94* 87*  CREATININE 3.72* 3.35*  GLUCOSE 461* 213*    Recent Labs Lab 10/17/16 1942 10/18/16 0241  HGB 12.6* 11.6*  HCT 38.6* 34.2*  WBC 9.2 8.6  PLT 147* 135*   Ct Abdomen Pelvis Wo Contrast  Result Date: 10/17/2016 CLINICAL DATA:  Nausea and vomiting. Diarrhea for couple of days. Low blood pressure. Renal insufficiency. EXAM: CT ABDOMEN AND PELVIS WITHOUT CONTRAST TECHNIQUE: Multidetector CT imaging of the abdomen and pelvis was performed following  the standard protocol without IV contrast. COMPARISON:  10/08/2008 FINDINGS: Lower chest: Mild atelectasis in the lung bases. Coronary artery and aortic calcifications. Pericardial calcifications. Pectus excavatum. Hepatobiliary: No focal liver abnormality is seen. No gallstones, gallbladder wall thickening, or biliary dilatation. Pancreas: There is a cystic lesion which appears to arise from the uncinate process of the pancreas and measures about 2.5 x 3.2 cm. This is enlarging since the  previous study (2 cm diameter previously). Differential diagnosis would include a mucinous cystic neoplasm. No mural nodules, septation, or wall thickening identified. No pancreatic duct dilatation. Spleen: Normal in size without focal abnormality. Adrenals/Urinary Tract: No adrenal gland nodules. Kidneys are symmetrical in size. No hydronephrosis or hydroureter. No stones identified. Bladder wall is diffusely thickened, suggesting outlet obstruction or cystitis. Stomach/Bowel: Stomach is unremarkable. Small bowel are decompressed. Contrast material flows through to the colon without evidence of small bowel obstruction. Decompression of the small bowel limits evaluation but some small bowel loops appear to have diffuse wall thickening which may indicate changes due to enteritis. Contrast material flows through the colon to the rectum without evidence of colonic obstruction. Diverticulosis of the sigmoid colon with wall thickening probably due to muscular hypertrophy. No inflammatory changes to suggest diverticulitis. Appendix is not identified. Vascular/Lymphatic: Diffuse vascular calcification throughout the abdominal aorta and major branch vessels. No significant lymphadenopathy. Reproductive: Prostate gland is enlarged, measuring 4.8 cm. Other: Postoperative right inguinal hernia repair. Small left inguinal hernia containing fat. Surgical scarring along the midline in the anterior abdominal wall. No free air or free fluid in the abdomen. Musculoskeletal: Degenerative changes throughout the lumbar spine. Diffuse demineralization suggesting osteoporosis. Compression fractures of T9 and T10 vertebrae. These have progressed since the previous two view chest from 10/10/2014. The cortex is irregular and linear lucencies are present suggesting probable acute fractures. IMPRESSION: 1. 3.2 cm diameter cystic lesion in the pancreas, increasing from previous CT. 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 In a  patient greater than 82 years of age, consider follow-up CT in 2 years or no follow-up, depending on patient's clinical condition. 2. Suggestion of small bowel wall thickening possibly indicating enteritis or possibly due to under distention. No evidence of bowel obstruction. Diverticulosis of sigmoid colon without evidence of diverticulitis. 3. Aortic atherosclerosis. 4. Bladder wall thickening could indicate cystitis or bladder outlet obstruction. Prostate gland is enlarged. 5. Diffuse bone demineralization suggesting osteoporosis. Progressing compression fractures seen at T9 and T10, probably acute. Electronically Signed   By: Lucienne Capers M.D.   On: 10/17/2016 23:29    ASSESSMENT / PLAN: Diabetic Ketoacidosis  Acute renal failure secondary to hypotension Lactic acidosis  Metabolic acidosis secondary to DKA and diarrhea  Viral vs. Bacterial Enteritis  Hypotension secondary to ?sepsis vs. hypovolemia  P: Supplemental O2 to maintain O2 sats >92% Continue empiric abx for now Trend WBC and monitor fever curve  Trend PCT's and lactic acid  Follow cultures Aggressive fluid resuscitation  Maintain map >65 CBG's q1hr while on insulin gtt Will transition off insulin gtt once anion gap closed and serum CO2 >20 Subq heparin for VTE prophylaxis  Monitor for s/sx of bleeding Trend BMP Replace electrolytes as indicated Monitor UOP NS @ 100 ml/hr Clear liquid diet and advance as tolerated once off insulin gtt   Marda Stalker, [place] Pager [phone] (please enter 7 digits) PCCM Consult Pager [phone] (please enter 7 digits)

## 2016-10-18 NOTE — ED Notes (Signed)
Contacted pharmacy to verify and send patients Requip and Mirapex

## 2016-10-18 NOTE — Progress Notes (Signed)
PT Cancellation Note  Patient Details Name: Ronney Honeywell MRN: [id number] DOB: [birth year]   Cancelled Treatment:    Reason Eval/Treat Not Completed: Other (comment). Consult received and chart reviewed. Pt with low trending BP 92/58 and now 79/47. Pt is not appropriate for PT at this time. Will hold until next date.   Trestin Vences 10/18/2016, 2:56 PM  Greggory Stallion, PT, DPT [phone]

## 2016-10-18 NOTE — Care Management (Addendum)
Met with patient to offer transfer to [facility]. He would like to stay at [facility] for treatment- forms completed and faxed to [facility]. I have notified ext. [phone] of patient decision and that fax has been sent. Patient is from home alone. He states he has been ambulating "about the same".  He is followed by [facility] home health  PT. If nursing is needed the [place] will need to authorize.  He states his granddaughter Berton Mount helps him with health care decisions- she is at work per patient.  [facility] is aware of admission.

## 2016-10-18 NOTE — ED Notes (Signed)
Pharmacy sending patient's restless leg medication

## 2016-10-18 NOTE — Care Management (Signed)
This is a [employer] patient that recently returned home with orders for home health through [facility]. RNCM will follow up with patient, [facility], and [facility] as [facility].

## 2016-10-18 NOTE — Progress Notes (Signed)
Inpatient Diabetes Program Recommendations  AACE/ADA: New Consensus Statement on Inpatient Glycemic Control (2015)  Target Ranges:  Prepandial:   less than 140 mg/dL      Peak postprandial:   less than 180 mg/dL (1-2 hours)      Critically ill patients:  140 - 180 mg/dL   Lab Results  Component Value Date   GLUCAP 106 (H) 10/18/2016   HGBA1C 7.9 (H) 05/24/2013    Review of Glycemic Control  Results for [name] Good, [name] Good (MRN [id number]) as of 10/18/2016 13:58  Ref. Range 10/18/2016 06:43 10/18/2016 07:32 10/18/2016 07:52 10/18/2016 11:47 10/18/2016 12:10  Glucose-Capillary Latest Ref Range: 65 - 99 mg/dL 92 65 75 45 (L) 106 (H)    Diabetes history: Type 2 Outpatient Diabetes medications: Lantus 15 units qhs, Metformin 1000mg  bid Current orders for Inpatient glycemic control: Lantus 8 units qday, Novolog 0-9 units tid, Novolog 0-5 units qhs  Inpatient Diabetes Program Recommendations:  Spoke to patient  At the bedside and his niece [name] Good (by phone).  [name] Good tells me that his insulin was decreased from Lantus 20 to Lantus 15 recently and his Metformin was increased from 500mg  bid to 1000mg  bid.  Informed patient that he has [facility] 1-800 access to an RN 24 hours a day and to reach out to them with questions or concerns in the future regarding his health.   Gentry Fitz, RN, BA, MHA, CDE Diabetes Coordinator Inpatient Diabetes Program  [phone] (Team Pager) [phone] ([place]) 10/18/2016 3:29 PM

## 2016-10-18 NOTE — Progress Notes (Addendum)
[place] [facility] [place] NAME: Jaxson Anglin    MR#:  [id number]  DATE OF BIRTH:  [birth year]  SUBJECTIVE:   patient sleeping  No acute events  DKA resolved MAP>65 this am  REVIEW OF SYSTEMS:    Review of Systems  Constitutional: Negative for fever, chills weight loss HENT: Negative for ear pain, nosebleeds, congestion, facial swelling, rhinorrhea, neck pain, neck stiffness and ear discharge.   Respiratory: Negative for cough, shortness of breath, wheezing  Cardiovascular: Negative for chest pain, palpitations and leg swelling.  Gastrointestinal:Patient is here with nausea vomiting and diarrhea which is improving  Genitourinary: Negative for dysuria, urgency, frequency, hematuria Musculoskeletal: Negative for back pain or joint pain Neurological: Negative for dizziness, seizures, syncope, focal weakness,  numbness and headaches.  Hematological: Does not bruise/bleed easily.  Psychiatric/Behavioral: Negative for hallucinations, confusion, dysphoric mood    Tolerating Diet: Clear liquid      DRUG ALLERGIES:   Allergies  Allergen Reactions  . Biaxin [Clarithromycin] Nausea Only    VITALS:  Blood pressure (!) 97/51, pulse 73, temperature 98.3 F (36.8 C), temperature source Oral, resp. rate 15, height 5\' 10"  (1.778 m), weight 67.3 kg (148 lb 5.9 oz), SpO2 93 %.  PHYSICAL EXAMINATION:  Constitutional: Appears well-developed and well-nourished. No distress. HENT: Normocephalic. . Dry mucous membranes Eyes: Conjunctivae and EOM are normal. PERRLA, no scleral icterus.  Neck: Normal ROM. Neck supple. No JVD. No tracheal deviation. CVS: irr, irr, 2/6 SEM no gallops, no carotid bruit.  Pulmonary: Effort and breath sounds normal, no stridor, rhonchi, wheezes, rales.  Abdominal: Soft. BS +,  no distension, tenderness, rebound or guarding.  Musculoskeletal: Normal range of motion. No edema and no tenderness.  Neuro: Sleepy. CN 2-12 grossly  intact. No focal deficits. Skin: Skin is warm and dry. No rash noted. Psychiatric: Normal mood and affect.      LABORATORY PANEL:   CBC  Recent Labs Lab 10/18/16 0241  WBC 8.6  HGB 11.6*  HCT 34.2*  PLT 135*   ------------------------------------------------------------------------------------------------------------------  Chemistries   Recent Labs Lab 10/17/16 1942 10/18/16 0241  NA 131* 135  K 5.6* 4.4  CL 93* 101  CO2 17* 22  GLUCOSE 461* 213*  BUN 94* 87*  CREATININE 3.72* 3.35*  CALCIUM 9.2 8.6*  AST 27  --   ALT 12*  --   ALKPHOS 89  --   BILITOT 0.9  --    ------------------------------------------------------------------------------------------------------------------  Cardiac Enzymes No results for input(s): TROPONINI in the last 168 hours. ------------------------------------------------------------------------------------------------------------------  RADIOLOGY:  Ct Abdomen Pelvis Wo Contrast  Result Date: 10/17/2016 CLINICAL DATA:  Nausea and vomiting. Diarrhea for couple of days. Low blood pressure. Renal insufficiency. EXAM: CT ABDOMEN AND PELVIS WITHOUT CONTRAST TECHNIQUE: Multidetector CT imaging of the abdomen and pelvis was performed following the standard protocol without IV contrast. COMPARISON:  10/08/2008 FINDINGS: Lower chest: Mild atelectasis in the lung bases. Coronary artery and aortic calcifications. Pericardial calcifications. Pectus excavatum. Hepatobiliary: No focal liver abnormality is seen. No gallstones, gallbladder wall thickening, or biliary dilatation. Pancreas: There is a cystic lesion which appears to arise from the uncinate process of the pancreas and measures about 2.5 x 3.2 cm. This is enlarging since the previous study (2 cm diameter previously). Differential diagnosis would include a mucinous cystic neoplasm. No mural nodules, septation, or wall thickening identified. No pancreatic duct dilatation. Spleen: Normal in size  without focal abnormality. Adrenals/Urinary Tract: No adrenal gland nodules. Kidneys are symmetrical in size.  No hydronephrosis or hydroureter. No stones identified. Bladder wall is diffusely thickened, suggesting outlet obstruction or cystitis. Stomach/Bowel: Stomach is unremarkable. Small bowel are decompressed. Contrast material flows through to the colon without evidence of small bowel obstruction. Decompression of the small bowel limits evaluation but some small bowel loops appear to have diffuse wall thickening which may indicate changes due to enteritis. Contrast material flows through the colon to the rectum without evidence of colonic obstruction. Diverticulosis of the sigmoid colon with wall thickening probably due to muscular hypertrophy. No inflammatory changes to suggest diverticulitis. Appendix is not identified. Vascular/Lymphatic: Diffuse vascular calcification throughout the abdominal aorta and major branch vessels. No significant lymphadenopathy. Reproductive: Prostate gland is enlarged, measuring 4.8 cm. Other: Postoperative right inguinal hernia repair. Small left inguinal hernia containing fat. Surgical scarring along the midline in the anterior abdominal wall. No free air or free fluid in the abdomen. Musculoskeletal: Degenerative changes throughout the lumbar spine. Diffuse demineralization suggesting osteoporosis. Compression fractures of T9 and T10 vertebrae. These have progressed since the previous two view chest from 10/10/2014. The cortex is irregular and linear lucencies are present suggesting probable acute fractures. IMPRESSION: 1. 3.2 cm diameter cystic lesion in the pancreas, increasing from previous CT. 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 In a patient greater than 81 years of age, consider follow-up CT in 2 years or no follow-up, depending on patient's clinical condition. 2. Suggestion of small bowel wall thickening possibly indicating enteritis or possibly due to  under distention. No evidence of bowel obstruction. Diverticulosis of sigmoid colon without evidence of diverticulitis. 3. Aortic atherosclerosis. 4. Bladder wall thickening could indicate cystitis or bladder outlet obstruction. Prostate gland is enlarged. 5. Diffuse bone demineralization suggesting osteoporosis. Progressing compression fractures seen at T9 and T10, probably acute. Electronically Signed   By: Lucienne Capers M.D.   On: 10/17/2016 23:29   Dg Chest Port 1 View  Result Date: 10/18/2016 CLINICAL DATA:  Cough and congestion. EXAM: PORTABLE CHEST 1 VIEW COMPARISON:  09/03/2016 FINDINGS: Mild cardiac enlargement. No pulmonary vascular congestion. Shallow inspiration. No focal airspace disease or consolidation in the lungs. No blunting of costophrenic angles. No pneumothorax. Old right rib fractures. Degenerative changes in the shoulders. Calcified and tortuous aorta. IMPRESSION: No evidence of active pulmonary disease.  Mild cardiac enlargement. Electronically Signed   By: Lucienne Capers M.D.   On: 10/18/2016 05:06     ASSESSMENT AND PLAN:   83 year old male with chronic atrial fibrillation who presented with nausea, vomiting and diarrhea and found to be profoundly hypotensive.  1. Severe hypotension due to dehydration/hypovolemia,  Sepsis ruled out Slowly improving with IV fluids  2. DKA: This is resolved Diabetes coordinator consultation Sliding scale insulin with Lantus Once off cleared liquid diet patient should be placed on ADA diet  3. Enteritis on CT scan: Continue Cipro and Flagyl C. difficile and GI panel are negative 4. Dementia: Continue Aricept  5. Acute kidney injury in the setting of hypovolemia and hypotension Slowly improving Consider renal consult Hold nephrotoxic agents Lactic acid due to acute kidney injury  6. Chronic atrial fibrillation: Continue aspirin Holding diltiazem due to hypotension  7. History of essential hypertension: Blood pressure  medications on hold due to hypotension   Knees a.m. labs and PT consult  Management plans discussed nursing CODE STATUS: dnr  TOTAL TIME TAKING CARE OF THIS PATIENT: 30 minutes.     POSSIBLE D/C 1-2 days, DEPENDING ON CLINICAL CONDITION.   Maude Gloor M.D on 10/18/2016 at 8:54 AM  Between 7am to 6pm - Pager - [phone] After 6pm go to [email or web address] - password [email or web address] [place] [facility]  Office  [phone]  CC: Primary care physician; Juline Patch, MD  Note: This dictation was prepared with Dragon dictation along with smaller phrase technology. Any transcriptional errors that result from this process are unintentional.

## 2016-10-19 LAB — BASIC METABOLIC PANEL
Anion gap: 8 (ref 5–15)
BUN: 53 mg/dL — AB (ref 6–20)
CALCIUM: 8.6 mg/dL — AB (ref 8.9–10.3)
CHLORIDE: 106 mmol/L (ref 101–111)
CO2: 20 mmol/L — ABNORMAL LOW (ref 22–32)
Creatinine, Ser: 1.9 mg/dL — ABNORMAL HIGH (ref 0.61–1.24)
GFR, EST AFRICAN AMERICAN: 36 mL/min — AB (ref >60)
GFR, EST NON AFRICAN AMERICAN: 31 mL/min — AB (ref >60)
Glucose, Bld: 315 mg/dL — ABNORMAL HIGH (ref 65–99)
Potassium: 4.7 mmol/L (ref 3.5–5.1)
SODIUM: 134 mmol/L — AB (ref 135–145)

## 2016-10-19 LAB — GLUCOSE, CAPILLARY
GLUCOSE-CAPILLARY: 233 mg/dL — AB (ref 65–99)
Glucose-Capillary: 351 mg/dL — ABNORMAL HIGH (ref 65–99)

## 2016-10-19 LAB — PROCALCITONIN: Procalcitonin: <0.1 ng/mL

## 2016-10-19 MED ORDER — DILTIAZEM HCL ER COATED BEADS 120 MG PO CP24: 120.0000 mg | ORAL_CAPSULE | Freq: Every day | ORAL | 0 refills | Status: DC

## 2016-10-19 MED ORDER — FUROSEMIDE 80 MG PO TABS: 40.0000 mg | ORAL_TABLET | Freq: Every day | ORAL | 0 refills | Status: DC

## 2016-10-19 MED ORDER — ZINC OXIDE 11.3 % EX CREA
TOPICAL_CREAM | Freq: Two times a day (BID) | CUTANEOUS | Status: DC
  Filled 2016-10-19: qty 56

## 2016-10-19 MED ORDER — ZINC OXIDE 11.3 % EX CREA: 1.0000 "application " | TOPICAL_CREAM | Freq: Two times a day (BID) | CUTANEOUS | 0 refills | Status: AC

## 2016-10-19 MED ORDER — ZINC OXIDE 40 % EX OINT
TOPICAL_OINTMENT | Freq: Two times a day (BID) | CUTANEOUS | Status: DC
  Filled 2016-10-19: qty 114

## 2016-10-19 NOTE — Care Management Note (Signed)
Case Management Note  Patient Details  Name: Staton Markey MRN: [id number] Date of Birth: [birth year]  Subjective/Objective:  Discharging today home with home health.                  Action/Plan: Patient updated on resumption of care with [facility] and patient is in agreement. TC to granddaughter , [name]. She is concerned about the wound to his sacral area  And that it is getting worse. She has tried all other measures which have failed including prescriptions from his PCP. She wants patient seen by the wound care RN prior to discharge. TC to the [place] at ext [phone] multiple times for home health RN  and cant get an answer. [facility] is going to work on further authorization of a Marine scientist.    Expected Discharge Date:  10/19/16               Expected Discharge Plan:     In-House Referral:     Discharge planning Services  CM Consult  Post Acute Care Choice:  Home Health, Resumption of Svcs/PTA Provider Choice offered to:  Patient  DME Arranged:    DME Agency:     HH Arranged:  PT HH Agency:  [place]  Status of Service:  Completed, signed off  If discussed at [place] of Stay Meetings, dates discussed:    Additional Comments:  Jolly Mango, RN 10/19/2016, 9:40 AM

## 2016-10-19 NOTE — Discharge Instructions (Signed)
[facility] appointment on Oct 28, 2016 at 10:00am with Darylene Price, [place]. Please call [phone] to reschedule.   Nursing instructions/ Wound Instructions: Cleanse areas Twice daily with NS and pat dry, apply Balmex liberally to affected areas. Keep areas clean and dry, with frequent position changes.

## 2016-10-19 NOTE — Evaluation (Signed)
Physical Therapy Evaluation Patient Details Name: [name] Good MRN: [id number] DOB: [birth year] Today's Date: 10/19/2016   History of Present Illness  Pt admitted for DKA. Pt with complaints of hypotension, emesis and diarrhea. History includes CAD, CHF, DM, and HTN. Pt reports he currently receives HHPT.  Clinical Impression  Pt is a pleasant 83 year old male who was admitted for DKA. Pt performs bed mobility with mod I, transfers with cga, and ambulation with cga using RW. Pt demonstrates deficits with strength/mobility/balance. Would benefit from skilled PT to address above deficits and promote optimal return to PLOF. Recommend transition to [facility] upon discharge from acute hospitalization.       Follow Up Recommendations Home health PT    Equipment Recommendations  None recommended by PT    Recommendations for Other Services       Precautions / Restrictions Precautions Precautions: Fall Restrictions Weight Bearing Restrictions: No      Mobility  Bed Mobility Overal bed mobility: Modified Independent             General bed mobility comments: safe technique with sliding B LEs off bed. Once seated at EOB, pt able to demonstrate upright posture.  Transfers Overall transfer level: Needs assistance Equipment used: Rolling walker (2 wheeled) Transfers: Sit to/from Stand Sit to Stand: Min guard         General transfer comment: safe technique with RW and upright posture  Ambulation/Gait Ambulation/Gait assistance: Min guard Ambulation Distance (Feet): 5 Feet Assistive device: Rolling walker (2 wheeled) Gait Pattern/deviations: Step-through pattern     General Gait Details: safe technique with turning and ambulating towards recliner. Breakfast tray imminently arriving and pt wishing to sit in recliner. No fatigue noted with exertion  Stairs            Wheelchair Mobility    Modified Rankin (Stroke Patients Only)       Balance Overall balance  assessment: Needs assistance Sitting-balance support: Feet supported Sitting balance-Leahy Scale: Good     Standing balance support: Bilateral upper extremity supported Standing balance-Leahy Scale: Good                               Pertinent Vitals/Pain Pain Assessment: Faces Faces Pain Scale: Hurts little more Pain Location: R shoulder due to "bone spurs" Pain Descriptors / Indicators: Dull;Discomfort Pain Intervention(s): Limited activity within patient's tolerance;Repositioned    Home Living Family/patient expects to be discharged to:: Private residence Living Arrangements: Alone   Type of Home: House Home Access: Ramped entrance     Home Layout: One level;Other (Comment) Home Equipment: Walker - 2 wheels;Cane - single point;Grab bars - tub/shower;Grab bars - toilet;Shower seat      Prior Function Level of Independence: Needs assistance   Gait / Transfers Assistance Needed: limited household ambulator with RW or SPC. Reports he sometimes still drives           Hand Dominance        Extremity/Trunk Assessment   Upper Extremity Assessment Upper Extremity Assessment: Generalized weakness (B UE grossly 4/5; R shoulder ROM limited to ~45 degrees)    Lower Extremity Assessment Lower Extremity Assessment: Generalized weakness (B LE grossly 4/5)       Communication   Communication: No difficulties  Cognition Arousal/Alertness: Awake/alert Behavior During Therapy: WFL for tasks assessed/performed Overall Cognitive Status: Within Functional Limits for tasks assessed  General Comments      Exercises Other Exercises Other Exercises: seated ther-ex performed on B LE including LAQ, ankle pumps, and hip abd/add. All ther-ex performed x 10 reps with supervision. Safe technique performed without fatigue.   Assessment/Plan    PT Assessment Patient needs continued PT services  PT Problem List  Decreased strength;Decreased balance;Decreased mobility       PT Treatment Interventions Gait training;Therapeutic exercise    PT Goals (Current goals can be found in the Care Plan section)  Acute Rehab PT Goals Patient Stated Goal: to go back home PT Goal Formulation: With patient Time For Goal Achievement: 11/02/16 Potential to Achieve Goals: Good    Frequency Min 2X/week   Barriers to discharge        Co-evaluation               AM-PAC PT "6 Clicks" Daily Activity  Outcome Measure Difficulty turning over in bed (including adjusting bedclothes, sheets and blankets)?: None Difficulty moving from lying on back to sitting on the side of the bed? : None Difficulty sitting down on and standing up from a chair with arms (e.g., wheelchair, bedside commode, etc,.)?: Total Help needed moving to and from a bed to chair (including a wheelchair)?: A Little Help needed walking in hospital room?: A Little Help needed climbing 3-5 steps with a railing? : A Little 6 Click Score: 18    End of Session Equipment Utilized During Treatment: Gait belt Activity Tolerance: Patient tolerated treatment well Patient left: in chair;with chair alarm set Nurse Communication: Mobility status PT Visit Diagnosis: Muscle weakness (generalized) (M62.81);Difficulty in walking, not elsewhere classified (R26.2)    Time: 1749-4496 PT Time Calculation (min) (ACUTE ONLY): 16 min   Charges:   PT Evaluation $PT Eval Low Complexity: 1 Procedure PT Treatments $Therapeutic Exercise: 8-22 mins   PT G Codes:        Greggory Stallion, PT, DPT [phone]   Nilo Fallin 10/19/2016, 9:51 AM

## 2016-10-19 NOTE — Progress Notes (Signed)
Discharge summary reviewed with patient and family. Answered all questions. Escorted to personal vehicle via wc. Rx and creams given upon discharge.

## 2016-10-19 NOTE — Discharge Summary (Addendum)
[facility] NAME: [name] Good    MR#:  [id number]  DATE OF BIRTH:  [birth year]  DATE OF ADMISSION:  10/17/2016 ADMITTING PHYSICIAN: Lance Coon, MD  DATE OF DISCHARGE: 10/19/2016  PRIMARY CARE PHYSICIAN: Juline Patch, MD    ADMISSION DIAGNOSIS:  Dehydration [E86.0] Enteritis [K52.9] Nausea & vomiting [R11.2] Hypotension, unspecified hypotension type [I95.9] Acute renal failure, unspecified acute renal failure type ([place]) [N17.9] Diabetic ketoacidosis without coma associated with type 2 diabetes mellitus ([place]) [E11.10]  DISCHARGE DIAGNOSIS:  Principal Problem:   DKA (diabetic ketoacidoses) ([place]) Active Problems:   HTN (hypertension)   HLD (hyperlipidemia)   CAD (coronary artery disease)   AKI (acute kidney injury) ([place])   Enteritis   SECONDARY DIAGNOSIS:   Past Medical History:  Diagnosis Date  . Arthritis   . CAD (coronary artery disease)   . Cancer ([place])   . CHF (congestive heart failure) ([place])   . Diabetes mellitus without complication ([place])   . Hyperlipidemia   . Hypertension   . Restless leg 09/01/2016  . Stroke ([place])   . Venous insufficiency     HOSPITAL COURSE:   83 year old male with chronic atrial fibrillation who presented with nausea, vomiting and diarrhea and found to be profoundly hypotensive.  1. Severe hypotension due to dehydration/hypovolemia,  Sepsis has been ruled out Hypotension improved although still low normal. I would recommend stopping medications for BP for now and having HHC/family  check BP twice a day and have follow up with PCP  2. DKA: This has  resolved Continue outpatient medications. Stop metformin for now until PCP lab work completed.   3. Enteritis on CT scan: he was on Cipro and Flagyl C. difficile and GI panel are negative He does not need antibiotics at discharge  4. Dementia: Continue Aricept  5. Acute kidney injury in the setting of hypovolemia and  hypotension This has improved with IVF. He should avoid nephrotoxic medications and have lab work repeated on Friday.  6. Chronic atrial fibrillation: Continue aspirin Holding diltiazem due to low/normal BP   7. History of essential hypertension: Blood pressure medications on hold due to hypotension 8. Sacral wound present on admission: Patient was evaluated by wound care. Dressing procedure/placement/frequency:Cleanse left hip and left ischial wounds with NS and pat gently dry.  Apply Boudreaux's butt paste to these areas.  Keep skin clean and dry.  Frequent position changes  DISCHARGE CONDITIONS AND DIET:   Stable Cardiac diabetic diet  CONSULTS OBTAINED:    DRUG ALLERGIES:   Allergies  Allergen Reactions  . Biaxin [Clarithromycin] Nausea Only    DISCHARGE MEDICATIONS:   Current Discharge Medication List    START taking these medications   Details  zinc oxide (BALMEX) 11.3 % CREA cream Apply 1 application topically 2 (two) times daily. Qty: 453 g, Refills: 0      CONTINUE these medications which have NOT CHANGED   Details  aspirin EC 325 MG tablet Take 325 mg by mouth daily.    atorvastatin (LIPITOR) 20 MG tablet Take 10 mg by mouth daily.    docusate sodium (COLACE) 100 MG capsule Take 100 mg by mouth daily.    donepezil (ARICEPT) 10 MG tablet Take 10 mg by mouth at bedtime.    insulin glargine (LANTUS) 100 UNIT/ML injection Inject 15 Units into the skin at bedtime. Dr Enzo Montgomery     Melatonin 3 MG TABS Take 3 mg by mouth every other day.  meloxicam (MOBIC) 15 MG tablet Take 7.5 mg by mouth daily.    pramipexole (MIRAPEX) 1 MG tablet Take 1.5 tablets (1.5 mg total) by mouth at bedtime. Dr Enzo Montgomery    rOPINIRole (REQUIP) 2 MG tablet Take 2 mg by mouth at bedtime.    ipratropium-albuterol (DUONEB) 0.5-2.5 (3) MG/3ML SOLN Take 3 mLs by nebulization every 6 (six) hours. Qty: 360 mL, Refills: 0      STOP taking these medications     doxazosin (CARDURA) 4 MG  tablet      losartan (COZAAR) 50 MG tablet      metFORMIN (GLUCOPHAGE) 1000 MG tablet      spironolactone (ALDACTONE) 25 MG tablet      diltiazem (CARDIZEM CD) 180 MG 24 hr capsule      furosemide (LASIX) 80 MG tablet           Today   CHIEF COMPLAINT:  No issues overnight, except some confusion Worked with Pt and did well   VITAL SIGNS:  Blood pressure 112/65, pulse 84, temperature 98.3 F (36.8 C), resp. rate 18, height 5\' 10"  (1.778 m), weight 67.3 kg (148 lb 5.9 oz), SpO2 100 %.   REVIEW OF SYSTEMS:  Review of Systems  Constitutional: Negative.  Negative for chills, fever and malaise/fatigue.  HENT: Negative.  Negative for ear discharge, ear pain, hearing loss, nosebleeds and sore throat.   Eyes: Negative.  Negative for blurred vision and pain.  Respiratory: Negative.  Negative for cough, hemoptysis, shortness of breath and wheezing.   Cardiovascular: Negative.  Negative for chest pain, palpitations and leg swelling.  Gastrointestinal: Negative.  Negative for abdominal pain, blood in stool, diarrhea, nausea and vomiting.  Genitourinary: Negative.  Negative for dysuria.  Musculoskeletal: Negative.  Negative for back pain.  Skin: Negative.   Neurological: Negative for dizziness, tremors, speech change, focal weakness, seizures and headaches.  Endo/Heme/Allergies: Negative.  Does not bruise/bleed easily.  Psychiatric/Behavioral: Positive for memory loss. Negative for depression, hallucinations and suicidal ideas.     PHYSICAL EXAMINATION:  GENERAL:  83 y.o.-year-old patient lying in the bed with no acute distress.  NECK:  Supple, no jugular venous distention. No thyroid enlargement, no tenderness.  LUNGS: Normal breath sounds bilaterally, no wheezing, rales,rhonchi  No use of accessory muscles of respiration.  CARDIOVASCULAR: irr, irr 2/6 SEM NO rubs, or gallops.  ABDOMEN: Soft, non-tender, non-distended. Bowel sounds present. No organomegaly or mass.   EXTREMITIES: No pedal edema, cyanosis, or clubbing.  PSYCHIATRIC: The patient is alert and oriented x 3.  SKIN: No obvious rash, lesion, or ulcer.   DATA REVIEW:   CBC  Recent Labs Lab 10/18/16 0241  WBC 8.6  HGB 11.6*  HCT 34.2*  PLT 135*    Chemistries   Recent Labs Lab 10/17/16 1942  10/19/16 0508  NA 131*  < > 134*  K 5.6*  < > 4.7  CL 93*  < > 106  CO2 17*  < > 20*  GLUCOSE 461*  < > 315*  BUN 94*  < > 53*  CREATININE 3.72*  < > 1.90*  CALCIUM 9.2  < > 8.6*  AST 27  --   --   ALT 12*  --   --   ALKPHOS 89  --   --   BILITOT 0.9  --   --   < > = values in this interval not displayed.  Cardiac Enzymes No results for input(s): TROPONINI in the last 168 hours.  Microbiology Results  @MICRORSLT48 @  RADIOLOGY:  Ct Abdomen Pelvis Wo Contrast  Result Date: 10/17/2016 CLINICAL DATA:  Nausea and vomiting. Diarrhea for couple of days. Low blood pressure. Renal insufficiency. EXAM: CT ABDOMEN AND PELVIS WITHOUT CONTRAST TECHNIQUE: Multidetector CT imaging of the abdomen and pelvis was performed following the standard protocol without IV contrast. COMPARISON:  10/08/2008 FINDINGS: Lower chest: Mild atelectasis in the lung bases. Coronary artery and aortic calcifications. Pericardial calcifications. Pectus excavatum. Hepatobiliary: No focal liver abnormality is seen. No gallstones, gallbladder wall thickening, or biliary dilatation. Pancreas: There is a cystic lesion which appears to arise from the uncinate process of the pancreas and measures about 2.5 x 3.2 cm. This is enlarging since the previous study (2 cm diameter previously). Differential diagnosis would include a mucinous cystic neoplasm. No mural nodules, septation, or wall thickening identified. No pancreatic duct dilatation. Spleen: Normal in size without focal abnormality. Adrenals/Urinary Tract: No adrenal gland nodules. Kidneys are symmetrical in size. No hydronephrosis or hydroureter. No stones identified. Bladder  wall is diffusely thickened, suggesting outlet obstruction or cystitis. Stomach/Bowel: Stomach is unremarkable. Small bowel are decompressed. Contrast material flows through to the colon without evidence of small bowel obstruction. Decompression of the small bowel limits evaluation but some small bowel loops appear to have diffuse wall thickening which may indicate changes due to enteritis. Contrast material flows through the colon to the rectum without evidence of colonic obstruction. Diverticulosis of the sigmoid colon with wall thickening probably due to muscular hypertrophy. No inflammatory changes to suggest diverticulitis. Appendix is not identified. Vascular/Lymphatic: Diffuse vascular calcification throughout the abdominal aorta and major branch vessels. No significant lymphadenopathy. Reproductive: Prostate gland is enlarged, measuring 4.8 cm. Other: Postoperative right inguinal hernia repair. Small left inguinal hernia containing fat. Surgical scarring along the midline in the anterior abdominal wall. No free air or free fluid in the abdomen. Musculoskeletal: Degenerative changes throughout the lumbar spine. Diffuse demineralization suggesting osteoporosis. Compression fractures of T9 and T10 vertebrae. These have progressed since the previous two view chest from 10/10/2014. The cortex is irregular and linear lucencies are present suggesting probable acute fractures. IMPRESSION: 1. 3.2 cm diameter cystic lesion in the pancreas, increasing from previous CT. 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 In a patient greater than 84 years of age, consider follow-up CT in 2 years or no follow-up, depending on patient's clinical condition. 2. Suggestion of small bowel wall thickening possibly indicating enteritis or possibly due to under distention. No evidence of bowel obstruction. Diverticulosis of sigmoid colon without evidence of diverticulitis. 3. Aortic atherosclerosis. 4. Bladder wall thickening  could indicate cystitis or bladder outlet obstruction. Prostate gland is enlarged. 5. Diffuse bone demineralization suggesting osteoporosis. Progressing compression fractures seen at T9 and T10, probably acute. Electronically Signed   By: Lucienne Capers M.D.   On: 10/17/2016 23:29   Dg Chest Port 1 View  Result Date: 10/18/2016 CLINICAL DATA:  Cough and congestion. EXAM: PORTABLE CHEST 1 VIEW COMPARISON:  09/03/2016 FINDINGS: Mild cardiac enlargement. No pulmonary vascular congestion. Shallow inspiration. No focal airspace disease or consolidation in the lungs. No blunting of costophrenic angles. No pneumothorax. Old right rib fractures. Degenerative changes in the shoulders. Calcified and tortuous aorta. IMPRESSION: No evidence of active pulmonary disease.  Mild cardiac enlargement. Electronically Signed   By: Lucienne Capers M.D.   On: 10/18/2016 05:06      Current Discharge Medication List    START taking these medications   Details  zinc oxide (BALMEX) 11.3 % CREA cream Apply 1 application topically  2 (two) times daily. Qty: 453 g, Refills: 0      CONTINUE these medications which have NOT CHANGED   Details  aspirin EC 325 MG tablet Take 325 mg by mouth daily.    atorvastatin (LIPITOR) 20 MG tablet Take 10 mg by mouth daily.    docusate sodium (COLACE) 100 MG capsule Take 100 mg by mouth daily.    donepezil (ARICEPT) 10 MG tablet Take 10 mg by mouth at bedtime.    insulin glargine (LANTUS) 100 UNIT/ML injection Inject 15 Units into the skin at bedtime. Dr Enzo Montgomery     Melatonin 3 MG TABS Take 3 mg by mouth every other day.     meloxicam (MOBIC) 15 MG tablet Take 7.5 mg by mouth daily.    pramipexole (MIRAPEX) 1 MG tablet Take 1.5 tablets (1.5 mg total) by mouth at bedtime. Dr Enzo Montgomery    rOPINIRole (REQUIP) 2 MG tablet Take 2 mg by mouth at bedtime.    ipratropium-albuterol (DUONEB) 0.5-2.5 (3) MG/3ML SOLN Take 3 mLs by nebulization every 6 (six) hours. Qty: 360 mL, Refills: 0       STOP taking these medications     doxazosin (CARDURA) 4 MG tablet      losartan (COZAAR) 50 MG tablet      metFORMIN (GLUCOPHAGE) 1000 MG tablet      spironolactone (ALDACTONE) 25 MG tablet      diltiazem (CARDIZEM CD) 180 MG 24 hr capsule      furosemide (LASIX) 80 MG tablet             Management plans discussed with the patient and he is in agreement. Stable [facility] discharge home with [facility]  Patient should follow up with pcp  CODE STATUS:     Code Status Orders        Start     Ordered   10/18/16 0229  Do not attempt resuscitation (DNR)  Continuous    Question Answer Comment  In the event of cardiac or respiratory ARREST Do not call a "code blue"   In the event of cardiac or respiratory ARREST Do not perform Intubation, CPR, defibrillation or ACLS   In the event of cardiac or respiratory ARREST Use medication by any route, position, wound care, and other measures to relive pain and suffering. May use oxygen, suction and manual treatment of airway obstruction as needed for comfort.      10/18/16 0228    Code Status History    Date Active Date Inactive Code Status Order ID Comments User Context   09/01/2016 11:50 PM 09/04/2016  5:29 PM DNR [id number]  Lance Coon, MD Inpatient   09/01/2016  9:20 PM 09/01/2016 11:50 PM DNR [id number]  Merlyn Lot, MD ED   10/05/2015 12:08 AM 10/11/2015  2:18 PM Full Code [id number]  Mikael Spray, NP ED      TOTAL TIME TAKING CARE OF THIS PATIENT: 37 minutes.    Note: This dictation was prepared with Dragon dictation along with smaller phrase technology. Any transcriptional errors that result from this process are unintentional.  Zephyr Sausedo M.D on 10/19/2016 at 1:31 PM  Between 7am to 6pm - Pager - [phone] After 6pm go to [email or web address] - password [email or web address] [facility]  Office  [phone]  CC: Primary care physician; Juline Patch, MD

## 2016-10-19 NOTE — Consult Note (Signed)
[place] Nurse wound consult note Reason for Consult:Moisture associated skin damage (incontinence) and stage 2 pressure injury to left hip and left ischium, present on admission.  Spoke with [name], granddaughter who feels that skin damage is more from pressure than moisture.  Indicates that he cleans himself promptly when incontinent and showers daily.  Will recommend thick barrier paste to these areas and educate patient and family on repositioning.  This includes small, frequent position changes while up in chair to relive and offload pressure.  Wound type:moisture and pressure Pressure Injury POA: Yes Measurement: Left hip:  2 cm x 2 cm nonblanchable erythema with scabbed areas noted.   Left buttocks, over ischial tuberosity:  2 cm x 1 cm x 0.2 cm  Wound TIW:PYKD and moist, 25% scabbed Drainage (amount, consistency, odor) None noted Periwound:intact Dressing procedure/placement/frequency:Cleanse left hip and left ischial wounds with NS and pat gently dry.  Apply Boudreaux's butt paste to these areas.  Keep skin clean and dry.  Frequent position changes.  Will not follow at this time.  Please re-consult if needed.  Domenic Moras RN BSN [place] Pager [phone]

## 2016-10-19 NOTE — Progress Notes (Signed)
Pt is forgetful and confused at times. Forgets that he is in hospital and needs reorientation.  Assisted pt to call family. Family called RN at desk and said that patient is calling them saying "upset about the bed." Asked RN to check on pt and see what is bothering him. Pt has been using the call bell on the bed to call nurse throughout night. Now complains that it hurts him to reach this call bell. RN placed a hand held call bell in the room and placed on the patients chest. Educated pt on how to use this easy to reach call bell. RN asked pt if anything else could be done. Pt denied needs.   Pt unable to tolerate turns related to abdominal pain and refused to turn q2h. Wound nurse consult ordered.

## 2016-10-19 NOTE — Progress Notes (Signed)
Paged MD, family requested to talk with MD regarding discharge and their concerns.

## 2016-10-19 NOTE — Progress Notes (Signed)
Patient refused to be repositioned this am, educated with non-acceptance. Will reassess approach

## 2016-10-21 ENCOUNTER — Inpatient Hospital Stay: Payer: Self-pay | Admitting: Family Medicine

## 2016-10-22 LAB — CULTURE, BLOOD (ROUTINE X 2)
CULTURE: NO GROWTH
Culture: NO GROWTH
Special Requests: ADEQUATE

## 2016-10-24 ENCOUNTER — Encounter: Payer: Self-pay | Admitting: Family Medicine

## 2016-10-24 ENCOUNTER — Ambulatory Visit (INDEPENDENT_AMBULATORY_CARE_PROVIDER_SITE_OTHER): Admitting: Family Medicine

## 2016-10-24 ENCOUNTER — Ambulatory Visit: Payer: Medicare Other | Admitting: Surgery

## 2016-10-24 VITALS — BP 110/64 | HR 84

## 2016-10-24 DIAGNOSIS — Z09 Encounter for follow-up examination after completed treatment for conditions other than malignant neoplasm: Secondary | ICD-10-CM

## 2016-10-24 DIAGNOSIS — L89322 Pressure ulcer of left buttock, stage 2: Secondary | ICD-10-CM

## 2016-10-24 NOTE — Progress Notes (Signed)
Name: [name] Good   MRN: [id number]    DOB: 1930-11-10   Date:10/24/2016       Progress Note  Subjective  Chief Complaint  Chief Complaint  Patient presents with  . Follow-up    hosp follow up    Patient for transition to care for discharge from hospital on 10/20/2016. Patient continue to gradually improve from dehydration and DKA.  [facility] as well as wound care initiated. Patient has dementia which has not worsened since discharge.    No problem-specific Assessment & Plan notes found for this encounter.   Past Medical History:  Diagnosis Date  . Arthritis   . CAD (coronary artery disease)   . Cancer ([place])   . CHF (congestive heart failure) ([place])   . Diabetes mellitus without complication ([place])   . Hyperlipidemia   . Hypertension   . Restless leg 09/01/2016  . Stroke ([place])   . Venous insufficiency     Past Surgical History:  Procedure Laterality Date  . BACK SURGERY    . COLONOSCOPY W/ BIOPSIES AND MANOMETRY CATHETER PLACEMENT  2014   normal  . EYE SURGERY Bilateral    cataract  . HERNIA REPAIR    . SHOULDER SURGERY Right   . SKIN CANCER EXCISION     nose, ear, arm, and face    Family History  Problem Relation Age of Onset  . Heart disease Father   . Hypertension Father   . Stroke Father     Social History   Social History  . Marital status: Widowed    Spouse name: N/A  . Number of children: N/A  . Years of education: N/A   Occupational History  . Not on file.   Social History Main Topics  . Smoking status: Former Research scientist (life sciences)  . Smokeless tobacco: Current User    Types: Chew  . Alcohol use No  . Drug use: No  . Sexual activity: No   Other Topics Concern  . Not on file   Social History Narrative  . No narrative on file    Allergies  Allergen Reactions  . Biaxin [Clarithromycin] Nausea Only    Outpatient Medications Prior to Visit  Medication Sig Dispense Refill  . aspirin EC 325 MG tablet Take 325 mg by  mouth daily.    Marland Kitchen atorvastatin (LIPITOR) 20 MG tablet Take 10 mg by mouth daily.    Marland Kitchen donepezil (ARICEPT) 10 MG tablet Take 10 mg by mouth at bedtime.    . insulin glargine (LANTUS) 100 UNIT/ML injection Inject 15 Units into the skin at bedtime. Dr Enzo Montgomery     . Melatonin 3 MG TABS Take 3 mg by mouth every other day.     . meloxicam (MOBIC) 15 MG tablet Take 7.5 mg by mouth daily.    . pramipexole (MIRAPEX) 1 MG tablet Take 1.5 tablets (1.5 mg total) by mouth at bedtime. Dr Enzo Montgomery    . rOPINIRole (REQUIP) 2 MG tablet Take 2 mg by mouth at bedtime.    Marland Kitchen zinc oxide (BALMEX) 11.3 % CREA cream Apply 1 application topically 2 (two) times daily. 453 g 0  . docusate sodium (COLACE) 100 MG capsule Take 100 mg by mouth daily.    Marland Kitchen ipratropium-albuterol (DUONEB) 0.5-2.5 (3) MG/3ML SOLN Take 3 mLs by nebulization every 6 (six) hours. (Patient not taking: Reported on 10/30/2015) 360 mL 0   No facility-administered medications prior to visit.     Review of Systems  Constitutional: Negative for chills, fever, malaise/fatigue and weight loss.  HENT: Negative for ear discharge, ear pain and sore throat.   Eyes: Negative for blurred vision.  Respiratory: Negative for cough, sputum production, shortness of breath and wheezing.   Cardiovascular: Negative for chest pain, palpitations and leg swelling.  Gastrointestinal: Negative for abdominal pain, blood in stool, constipation, diarrhea, heartburn, melena and nausea.  Genitourinary: Negative for dysuria, frequency, hematuria and urgency.  Musculoskeletal: Negative for back pain, joint pain, myalgias and neck pain.  Skin: Negative for rash.  Neurological: Negative for dizziness, tingling, sensory change, focal weakness and headaches.  Endo/Heme/Allergies: Negative for environmental allergies and polydipsia. Does not bruise/bleed easily.  Psychiatric/Behavioral: Negative for depression and suicidal ideas. The patient is not nervous/anxious and does not have  insomnia.      Objective  Vitals:   10/24/16 0959  BP: 110/64  Pulse: 84    Physical Exam  Constitutional: He is oriented to person, place, and time and well-developed, well-nourished, and in no distress.  HENT:  Head: Normocephalic.  Right Ear: External ear normal.  Left Ear: External ear normal.  Nose: Nose normal.  Mouth/Throat: Oropharynx is clear and moist.  Eyes: Conjunctivae and EOM are normal. Pupils are equal, round, and reactive to light. Right eye exhibits no discharge. Left eye exhibits no discharge. No scleral icterus.  Neck: Normal range of motion. Neck supple. No JVD present. No tracheal deviation present. No thyromegaly present.  Cardiovascular: Normal rate, regular rhythm, normal heart sounds and intact distal pulses.  Exam reveals no gallop and no friction rub.   No murmur heard. Pulmonary/Chest: Breath sounds normal. No respiratory distress. He has no wheezes. He has no rales.  Abdominal: Soft. Bowel sounds are normal. He exhibits no mass. There is no hepatosplenomegaly. There is no tenderness. There is no rebound, no guarding and no CVA tenderness.  Musculoskeletal: Normal range of motion. He exhibits no edema or tenderness.  Lymphadenopathy:    He has no cervical adenopathy.  Neurological: He is alert and oriented to person, place, and time. He has normal sensation, normal strength and intact cranial nerves. No cranial nerve deficit.  Skin: Skin is warm. No rash noted.  Psychiatric: Mood and affect normal.  Nursing note and vitals reviewed.     Assessment & Plan  Problem List Items Addressed This Visit      Musculoskeletal and Integument   Pressure injury of skin    Other Visit Diagnoses    Hospital discharge follow-up    -  Primary      No orders of the defined types were placed in this encounter.     Dr. [facility]  10/24/16

## 2016-10-28 ENCOUNTER — Ambulatory Visit: Payer: Medicare Other | Admitting: Family

## 2016-10-31 ENCOUNTER — Ambulatory Visit: Payer: Medicare Other | Admitting: Surgery

## 2016-11-01 LAB — BLOOD GAS, VENOUS
Acid-base deficit: 12.7 mmol/L — ABNORMAL HIGH (ref 0.0–2.0)
BICARBONATE: 15.7 mmol/L — AB (ref 20.0–28.0)
FIO2: 0.21
PATIENT TEMPERATURE: 37
PCO2 VEN: 45 mmHg (ref 44.0–60.0)
PH VEN: 7.15 — AB (ref 7.250–7.430)

## 2016-11-18 ENCOUNTER — Ambulatory Visit: Payer: Medicare Other | Admitting: Family

## 2016-12-27 ENCOUNTER — Other Ambulatory Visit: Payer: Self-pay

## 2016-12-27 MED ORDER — MELOXICAM 15 MG PO TABS: 7.5000 mg | ORAL_TABLET | Freq: Every day | ORAL | 5 refills | Status: DC

## 2016-12-27 MED ORDER — MELOXICAM 15 MG PO TABS: 15.0000 mg | ORAL_TABLET | Freq: Every day | ORAL | 5 refills | Status: AC

## 2017-01-18 ENCOUNTER — Other Ambulatory Visit: Payer: Self-pay

## 2017-01-30 ENCOUNTER — Other Ambulatory Visit: Payer: Self-pay | Admitting: Family Medicine

## 2017-02-15 ENCOUNTER — Other Ambulatory Visit: Payer: Self-pay

## 2017-02-15 ENCOUNTER — Other Ambulatory Visit: Payer: Self-pay | Admitting: Family Medicine

## 2017-03-09 DIAGNOSIS — M7531 Calcific tendinitis of right shoulder: Secondary | ICD-10-CM | POA: Diagnosis not present

## 2017-03-20 ENCOUNTER — Other Ambulatory Visit: Payer: Self-pay | Admitting: Family Medicine

## 2017-05-21 ENCOUNTER — Emergency Department

## 2017-05-21 ENCOUNTER — Inpatient Hospital Stay
Admission: EM | Admit: 2017-05-21 | Discharge: 2017-06-13 | DRG: 871 | Disposition: E | Attending: Family Medicine | Admitting: Family Medicine

## 2017-05-21 DIAGNOSIS — Z789 Other specified health status: Secondary | ICD-10-CM | POA: Diagnosis not present

## 2017-05-21 DIAGNOSIS — R4020 Unspecified coma: Secondary | ICD-10-CM | POA: Diagnosis not present

## 2017-05-21 DIAGNOSIS — Z8249 Family history of ischemic heart disease and other diseases of the circulatory system: Secondary | ICD-10-CM

## 2017-05-21 DIAGNOSIS — R0602 Shortness of breath: Secondary | ICD-10-CM | POA: Diagnosis not present

## 2017-05-21 DIAGNOSIS — E785 Hyperlipidemia, unspecified: Secondary | ICD-10-CM | POA: Diagnosis present

## 2017-05-21 DIAGNOSIS — R6520 Severe sepsis without septic shock: Secondary | ICD-10-CM | POA: Diagnosis present

## 2017-05-21 DIAGNOSIS — Z66 Do not resuscitate: Secondary | ICD-10-CM | POA: Diagnosis not present

## 2017-05-21 DIAGNOSIS — I110 Hypertensive heart disease with heart failure: Secondary | ICD-10-CM | POA: Diagnosis not present

## 2017-05-21 DIAGNOSIS — R531 Weakness: Secondary | ICD-10-CM

## 2017-05-21 DIAGNOSIS — F17220 Nicotine dependence, chewing tobacco, uncomplicated: Secondary | ICD-10-CM | POA: Diagnosis present

## 2017-05-21 DIAGNOSIS — I2510 Atherosclerotic heart disease of native coronary artery without angina pectoris: Secondary | ICD-10-CM | POA: Diagnosis present

## 2017-05-21 DIAGNOSIS — Z79899 Other long term (current) drug therapy: Secondary | ICD-10-CM

## 2017-05-21 DIAGNOSIS — M1990 Unspecified osteoarthritis, unspecified site: Secondary | ICD-10-CM | POA: Diagnosis not present

## 2017-05-21 DIAGNOSIS — E872 Acidosis: Secondary | ICD-10-CM | POA: Diagnosis present

## 2017-05-21 DIAGNOSIS — Z8673 Personal history of transient ischemic attack (TIA), and cerebral infarction without residual deficits: Secondary | ICD-10-CM

## 2017-05-21 DIAGNOSIS — Z7982 Long term (current) use of aspirin: Secondary | ICD-10-CM | POA: Diagnosis not present

## 2017-05-21 DIAGNOSIS — R197 Diarrhea, unspecified: Secondary | ICD-10-CM | POA: Diagnosis not present

## 2017-05-21 DIAGNOSIS — Z791 Long term (current) use of non-steroidal anti-inflammatories (NSAID): Secondary | ICD-10-CM

## 2017-05-21 DIAGNOSIS — I959 Hypotension, unspecified: Secondary | ICD-10-CM | POA: Diagnosis not present

## 2017-05-21 DIAGNOSIS — A419 Sepsis, unspecified organism: Principal | ICD-10-CM

## 2017-05-21 DIAGNOSIS — I872 Venous insufficiency (chronic) (peripheral): Secondary | ICD-10-CM | POA: Diagnosis not present

## 2017-05-21 DIAGNOSIS — R4182 Altered mental status, unspecified: Secondary | ICD-10-CM

## 2017-05-21 DIAGNOSIS — Z515 Encounter for palliative care: Secondary | ICD-10-CM | POA: Diagnosis present

## 2017-05-21 DIAGNOSIS — I509 Heart failure, unspecified: Secondary | ICD-10-CM | POA: Diagnosis present

## 2017-05-21 DIAGNOSIS — G2581 Restless legs syndrome: Secondary | ICD-10-CM | POA: Diagnosis present

## 2017-05-21 DIAGNOSIS — I4891 Unspecified atrial fibrillation: Secondary | ICD-10-CM | POA: Diagnosis not present

## 2017-05-21 DIAGNOSIS — Z881 Allergy status to other antibiotic agents status: Secondary | ICD-10-CM | POA: Diagnosis not present

## 2017-05-21 DIAGNOSIS — Z794 Long term (current) use of insulin: Secondary | ICD-10-CM

## 2017-05-21 DIAGNOSIS — E119 Type 2 diabetes mellitus without complications: Secondary | ICD-10-CM | POA: Diagnosis present

## 2017-05-21 DIAGNOSIS — G9341 Metabolic encephalopathy: Secondary | ICD-10-CM | POA: Diagnosis present

## 2017-05-21 HISTORY — DX: Unspecified atrial fibrillation: I48.91

## 2017-05-21 LAB — BLOOD GAS, VENOUS
ACID-BASE DEFICIT: 24.2 mmol/L — AB (ref 0.0–2.0)
BICARBONATE: 8.2 mmol/L — AB (ref 20.0–28.0)
O2 SAT: 27.8 %
PATIENT TEMPERATURE: 37
pCO2, Ven: 42 mmHg — ABNORMAL LOW (ref 44.0–60.0)
pH, Ven: 6.9 — CL (ref 7.250–7.430)
pO2, Ven: 34 mmHg (ref 32.0–45.0)

## 2017-05-21 LAB — CBC WITH DIFFERENTIAL/PLATELET
BASOS PCT: 1 %
Basophils Absolute: 0 10*3/uL (ref 0–0.1)
EOS ABS: 0 10*3/uL (ref 0–0.7)
Eosinophils Relative: 0 %
HEMATOCRIT: 38.4 % — AB (ref 40.0–52.0)
Hemoglobin: 12.2 g/dL — ABNORMAL LOW (ref 13.0–18.0)
Lymphocytes Relative: 7 %
Lymphs Abs: 0.6 10*3/uL — ABNORMAL LOW (ref 1.0–3.6)
MCH: 32.8 pg (ref 26.0–34.0)
MCHC: 31.9 g/dL — AB (ref 32.0–36.0)
MCV: 102.9 fL — ABNORMAL HIGH (ref 80.0–100.0)
MONOS PCT: 5 %
Monocytes Absolute: 0.4 10*3/uL (ref 0.2–1.0)
Neutro Abs: 7.5 10*3/uL — ABNORMAL HIGH (ref 1.4–6.5)
Neutrophils Relative %: 87 %
Platelets: 179 10*3/uL (ref 150–440)
RBC: 3.73 MIL/uL — ABNORMAL LOW (ref 4.40–5.90)
RDW: 17 % — ABNORMAL HIGH (ref 11.5–14.5)
WBC: 8.6 10*3/uL (ref 3.8–10.6)

## 2017-05-21 LAB — BRAIN NATRIURETIC PEPTIDE: B NATRIURETIC PEPTIDE 5: 356 pg/mL — AB (ref 0.0–100.0)

## 2017-05-21 LAB — COMPREHENSIVE METABOLIC PANEL
ALBUMIN: 4.4 g/dL (ref 3.5–5.0)
ALK PHOS: 68 U/L (ref 38–126)
ALT: 12 U/L — AB (ref 17–63)
AST: 40 U/L (ref 15–41)
Anion gap: 40 — ABNORMAL HIGH (ref 5–15)
BUN: 49 mg/dL — ABNORMAL HIGH (ref 6–20)
CALCIUM: 9.1 mg/dL (ref 8.9–10.3)
CO2: 8 mmol/L — AB (ref 22–32)
CREATININE: 2.47 mg/dL — AB (ref 0.61–1.24)
Chloride: 94 mmol/L — ABNORMAL LOW (ref 101–111)
GFR calc Af Amer: 26 mL/min — ABNORMAL LOW (ref >60)
GFR calc non Af Amer: 22 mL/min — ABNORMAL LOW (ref >60)
GLUCOSE: 218 mg/dL — AB (ref 65–99)
Potassium: 3.7 mmol/L (ref 3.5–5.1)
SODIUM: 142 mmol/L (ref 135–145)
Total Bilirubin: 1.1 mg/dL (ref 0.3–1.2)
Total Protein: 6.3 g/dL — ABNORMAL LOW (ref 6.5–8.1)

## 2017-05-21 LAB — TROPONIN I: TROPONIN I: 0.12 ng/mL — AB (ref <0.03)

## 2017-05-21 LAB — LACTIC ACID, PLASMA
Lactic Acid, Venous: 16.9 mmol/L (ref 0.5–1.9)
Lactic Acid, Venous: 15.5 mmol/L (ref 0.5–1.9)

## 2017-05-21 MED ORDER — ONDANSETRON HCL 4 MG/2ML IJ SOLN: 4.0000 mg | Freq: Four times a day (QID) | INTRAMUSCULAR | Status: DC | PRN

## 2017-05-21 MED ORDER — BIOTENE DRY MOUTH MT LIQD: 15.0000 mL | OROMUCOSAL | Status: DC | PRN

## 2017-05-21 MED ORDER — PIPERACILLIN-TAZOBACTAM 3.375 G IVPB: 3.3750 g | Freq: Two times a day (BID) | INTRAVENOUS | Status: DC

## 2017-05-21 MED ORDER — SODIUM CHLORIDE 0.9 % IV BOLUS (SEPSIS)
1000.0000 mL | Freq: Once | INTRAVENOUS | Status: AC
  Administered 2017-05-21: 1000 mL via INTRAVENOUS

## 2017-05-21 MED ORDER — MORPHINE SULFATE (CONCENTRATE) 10 MG/0.5ML PO SOLN: 5.0000 mg | ORAL | Status: DC | PRN

## 2017-05-21 MED ORDER — ONDANSETRON 4 MG PO TBDP
4.0000 mg | ORAL_TABLET | Freq: Four times a day (QID) | ORAL | Status: DC | PRN
  Filled 2017-05-21: qty 1

## 2017-05-21 MED ORDER — CHLORPROMAZINE HCL 25 MG PO TABS
25.0000 mg | ORAL_TABLET | Freq: Four times a day (QID) | ORAL | Status: DC | PRN
  Filled 2017-05-21: qty 1

## 2017-05-21 MED ORDER — ACETAMINOPHEN 325 MG PO TABS: 650.0000 mg | ORAL_TABLET | Freq: Four times a day (QID) | ORAL | Status: DC | PRN

## 2017-05-21 MED ORDER — VANCOMYCIN HCL IN DEXTROSE 1-5 GM/200ML-% IV SOLN
1000.0000 mg | Freq: Once | INTRAVENOUS | Status: AC
  Administered 2017-05-21: 1000 mg via INTRAVENOUS
  Filled 2017-05-21: qty 200

## 2017-05-21 MED ORDER — LORAZEPAM 2 MG/ML IJ SOLN: 1.0000 mg | INTRAMUSCULAR | Status: DC | PRN

## 2017-05-21 MED ORDER — POLYVINYL ALCOHOL 1.4 % OP SOLN
1.0000 [drp] | Freq: Four times a day (QID) | OPHTHALMIC | Status: DC | PRN
  Filled 2017-05-21: qty 15

## 2017-05-21 MED ORDER — HALOPERIDOL LACTATE 5 MG/ML IJ SOLN: 0.5000 mg | INTRAMUSCULAR | Status: DC | PRN

## 2017-05-21 MED ORDER — LORAZEPAM 1 MG PO TABS: 1.0000 mg | ORAL_TABLET | ORAL | Status: DC | PRN

## 2017-05-21 MED ORDER — HALOPERIDOL 0.5 MG PO TABS
0.5000 mg | ORAL_TABLET | ORAL | Status: DC | PRN
  Filled 2017-05-21: qty 1

## 2017-05-21 MED ORDER — SODIUM CHLORIDE 0.9% FLUSH: 3.0000 mL | Freq: Two times a day (BID) | INTRAVENOUS | Status: DC

## 2017-05-21 MED ORDER — ATROPINE SULFATE 1 % OP SOLN
4.0000 [drp] | OPHTHALMIC | Status: DC | PRN
  Filled 2017-05-21: qty 2

## 2017-05-21 MED ORDER — LORAZEPAM 2 MG/ML PO CONC: 1.0000 mg | ORAL | Status: DC | PRN

## 2017-05-21 MED ORDER — BISACODYL 10 MG RE SUPP: 10.0000 mg | Freq: Every day | RECTAL | Status: DC | PRN

## 2017-05-21 MED ORDER — ACETAMINOPHEN 650 MG RE SUPP: 650.0000 mg | Freq: Four times a day (QID) | RECTAL | Status: DC | PRN

## 2017-05-21 MED ORDER — VANCOMYCIN HCL IN DEXTROSE 750-5 MG/150ML-% IV SOLN: 750.0000 mg | INTRAVENOUS | Status: DC

## 2017-05-21 MED ORDER — HALOPERIDOL LACTATE 2 MG/ML PO CONC
0.5000 mg | ORAL | Status: DC | PRN
  Filled 2017-05-21: qty 0.3

## 2017-05-21 MED ORDER — PIPERACILLIN-TAZOBACTAM 3.375 G IVPB 30 MIN
3.3750 g | Freq: Once | INTRAVENOUS | Status: AC
  Administered 2017-05-21: 3.375 g via INTRAVENOUS
  Filled 2017-05-21: qty 50

## 2017-05-21 NOTE — ED Provider Notes (Addendum)
[facility] Emergency Department Provider Note  Time seen: 8:42 PM  I have reviewed the triage vital signs and the nursing notes.   HISTORY  Chief Complaint Weakness and Diarrhea    HPI [name] Good is a 83 y.o. male with a past medical history of CAD, CHF, diabetes, hypertension, hyperlipidemia, presents to the emergency department for generalized fatigue weakness and confusion.  According to family who is here with the patient they report several episodes of diarrhea today and generalized fatigue and weakness since this morning.  They also report the patient has not urinated since last night.  They state the patient is normally alert and oriented x4 converses normally, ambulates.  Today the patient is extremely weak, will rarely talks and does not appear to know where he is or who is speaking to him.  They state this is a major change from baseline.  Upon arrival the patient is noted beacon considerably hypotensive 60/48 he is also pale in appearance.  Daughter states the diarrhea today was dark in color.  Patient is unable to answer review of systems questioning.   Past Medical History:  Diagnosis Date  . Arthritis   . CAD (coronary artery disease)   . Cancer ([place])   . CHF (congestive heart failure) ([place])   . Diabetes mellitus without complication ([place])   . Hyperlipidemia   . Hypertension   . Restless leg 09/01/2016  . Stroke ([place])   . Venous insufficiency     Patient Active Problem List   Diagnosis Date Noted  . DKA (diabetic ketoacidoses) ([place]) 10/17/2016  . Enteritis 10/17/2016  . Pressure injury of skin 09/04/2016  . Sepsis ([place])   . HTN (hypertension) 09/01/2016  . HLD (hyperlipidemia) 09/01/2016  . CAD (coronary artery disease) 09/01/2016  . Diabetic hyperosmolar non-ketotic state ([place]) 09/01/2016  . Lactic acidosis 09/01/2016  . AKI (acute kidney injury) ([place]) 09/01/2016  . Septic shock ([place]) 10/05/2015  . Aspiration pneumonia of  right lower lobe ([place])   . Acute on chronic respiratory failure with hypoxia and hypercapnia ([facility])   . Abdominal pain, acute, left upper quadrant 02/02/2015    Past Surgical History:  Procedure Laterality Date  . BACK SURGERY    . COLONOSCOPY W/ BIOPSIES AND MANOMETRY CATHETER PLACEMENT  2014   normal  . EYE SURGERY Bilateral    cataract  . HERNIA REPAIR    . SHOULDER SURGERY Right   . SKIN CANCER EXCISION     nose, ear, arm, and face    Prior to Admission medications   Medication Sig Start Date End Date Taking? Authorizing Provider  aspirin EC 325 MG tablet Take 325 mg by mouth daily.    [provider]  atorvastatin (LIPITOR) 20 MG tablet Take 10 mg by mouth daily.    [provider]  donepezil (ARICEPT) 10 MG tablet Take 10 mg by mouth at bedtime.    [provider]  insulin glargine (LANTUS) 100 UNIT/ML injection Inject 0.1 mLs (10 Units total) into the skin daily. 01/30/17   Juline Patch, MD  Melatonin 3 MG TABS Take 3 mg by mouth at bedtime.    [provider]  meloxicam (MOBIC) 15 MG tablet Take 1 tablet (15 mg total) by mouth daily. 12/27/16   Juline Patch, MD  memantine (NAMENDA) 10 MG tablet TAKE (1) TABLET BY MOUTH TWICE DAILY 03/20/17   Juline Patch, MD  pramipexole (MIRAPEX) 1 MG tablet Take 1.5 tablets (1.5 mg total)  by mouth at bedtime. Dr Enzo Montgomery 10/11/15   Loletha Grayer, MD  rOPINIRole (REQUIP) 1 MG tablet TAKE TWO TABLETS AT BEDTIME. 02/15/17   Juline Patch, MD  rOPINIRole (REQUIP) 2 MG tablet Take 2 mg by mouth at bedtime.    [provider]  spironolactone (ALDACTONE) 25 MG tablet Take 25 mg by mouth daily.    [provider]  tamsulosin (FLOMAX) 0.4 MG CAPS capsule TAKE (1) CAPSULE BY MOUTH AT BEDTIME 03/20/17   Juline Patch, MD  zinc oxide (BALMEX) 11.3 % CREA cream Apply 1 application topically 2 (two) times daily. 10/19/16   Bettey Costa, MD    Allergies  Allergen Reactions  . Biaxin  [Clarithromycin] Nausea Only    Family History  Problem Relation Age of Onset  . Heart disease Father   . Hypertension Father   . Stroke Father     Social History Social History   Tobacco Use  . Smoking status: Former Research scientist (life sciences)  . Smokeless tobacco: Current User    Types: Chew  Substance Use Topics  . Alcohol use: No    Alcohol/week: 0.0 oz  . Drug use: No    Review of Systems Unable to obtain an adequate/accurate review of systems due to altered mental status ____________________________________________   PHYSICAL EXAM:  VITAL SIGNS: ED Triage Vitals  Enc Vitals Group     BP 05/30/2017 2010 (!) 60/48     Pulse Rate 05/31/2017 2010 96     Resp 06/10/2017 2010 (!) 25     Temp --      Temp src --      SpO2 06/04/2017 2010 96 %     Weight 05/20/2017 2011 120 lb (54.4 kg)     Height --      Head Circumference --      Peak Flow --      Pain Score --      Pain Loc --      Pain Edu? --      Excl. in [place]? --    Constitutional: Very somnolent, lying in bed with eyes closed, will look at you when you speak to him but rarely answers any questions.  Is not following commands.  Very pale in appearance. Eyes: Normal exam ENT   Head: Normocephalic and atraumatic.   Nose: No congestion/rhinnorhea.   Mouth/Throat: Dry mucous membranes Cardiovascular: Normal rate, regular rhythm.  Respiratory: Mild tachypnea but no increased respiratory effort.  Clear lung sounds bilaterally. Gastrointestinal: Soft and nontender. No distention.  No reaction to abdominal palpation. Musculoskeletal: Nontender with normal range of motion in all extremities.  Neurologic: Patient is very somnolent, unable to participate in neurological examination, not following commands. Skin:  Skin is warm, dry and intact, but pale in appearance. Psychiatric: Somnolent, confused  ____________________________________________  RADIOLOGY  X-ray shows pulmonary vascular congestion without edema.  EKG reviewed  and interpreted by myself shows atrial fibrillation at 92 bpm, slight widening of QRS, normal axis, slightly prolonged QTC, nonspecific ST changes. ____________________________________________   INITIAL IMPRESSION / ASSESSMENT AND PLAN / ED COURSE  Pertinent labs & imaging results that were available during my care of the patient were reviewed by me and considered in my medical decision making (see chart for details).  Patient presents to the emergency department for altered mental status weakness confusion and hypotension.  Differential would include infectious etiology, metabolic abnormality, renal failure, anemia.  Patient's stool is light brown and guaiac negative.  I have placed an ultrasound-guided 18-gauge  IV in the left arm, the nurse was able to place a 20-gauge in the right arm we will bolus 2 L of IV fluids.  Patient is afebrile, heart rate in the 90s.  Will await lab work.  Given the report of no urination since last night along with his hypotension and confusion I highly suspect renal failure to be the culprit of the patient's symptoms today.  Patient's labs are resulted showing a significant anion gap acidosis with an anion gap of 40, creatinine has worsened to 2.47 although not significantly off baseline.  Patient's troponin is slightly elevated at 0.12.  Patient's lactic acid is significantly elevated at 16.9.  I have activated sepsis protocols.  We will does broad-spectrum antibiotics.  The patient's blood pressure is responding very nicely to IV fluids last check 112/80 we will dose 2 L of fluid for a 30 mL/kg bolus.  Given the patient's confusion and altered mental status with significant lactic acidosis we will admit to the hospital for further treatment.  CRITICAL CARE Performed by: Harvest Dark   Total critical care time: 60 minutes  Critical care time was exclusive of separately billable procedures and treating other patients.  Critical care was necessary to treat or  prevent imminent or life-threatening deterioration.  Critical care was time spent personally by me on the following activities: development of treatment plan with patient and/or surrogate as well as nursing, discussions with consultants, evaluation of patient's response to treatment, examination of patient, obtaining history from patient or surrogate, ordering and performing treatments and interventions, ordering and review of laboratory studies, ordering and review of radiographic studies, pulse oximetry and re-evaluation of patient's condition.   Had a long discussion with the family.  They do not wish for pressors to be used.  Patient is a DNR.  They state the patient's quality of life has been somewhat poor and he does not want to be around any longer.  They are discussing among themselves whether or not they would like to transition to comfort care measures.  At this time we will continue with broad-spectrum antibiotics and IV fluids.  Patient remains very somnolent and minimally responsive.  Patient does have a history of CHF which makes fluid administration somewhat difficult/risky however with the patient's significant elevated lactate and hypotension I believe the benefit outweighs the risk currently. ____________________________________________   FINAL CLINICAL IMPRESSION(S) / ED DIAGNOSES  Weakness Lactic acidosis Sepsis  Harvest Dark, MD 05/15/2017 2128  Harvest Dark, MD 05/30/2017 2128  Harvest Dark, MD 05/30/2017 2131  Harvest Dark, MD 06/10/2017 2147

## 2017-05-21 NOTE — ED Notes (Signed)
In and out cath performed by Roswell Miners, RN; witnessed by [employer].  No urine returned. Bladder scan performed - scan shows 16 mL urine present.

## 2017-05-21 NOTE — Progress Notes (Signed)
Pharmacy Antibiotic Note  [name] Good is a 83 y.o. male admitted on 05/27/2017 with sepsis s/t intra-abdominal source.  Pharmacy has been consulted for vanc/zosyn dosing.  Plan: Patient received vanc 1g and zosyn 3.375g IV x 1 in the ED Will continue vanc 750 mg IV q36h w/ 8 hour stack dose. Will draw vanc trough 12/12 @ 1100 prior to 3rd dose to ensure therapeutic (or near therapeutic) trough Will continue zosyn 3.375g IV q8h   Ke 0.0186 T1/2 37 ~ 36 hrs Goal trough 15 - 20 mcg/mL   Height: 5\' 7"  (170.2 cm) Weight: 120 lb (54.4 kg) IBW/kg (Calculated) : 66.1  Temp (24hrs), Avg:96.7 F (35.9 C), Min:96.7 F (35.9 C), Max:96.7 F (35.9 C)  Recent Labs  Lab 05/23/2017 2019  WBC 8.6  CREATININE 2.47*  LATICACIDVEN 16.9*    Estimated Creatinine Clearance: 17.1 mL/min (A) (by C-G formula based on SCr of 2.47 mg/dL (H)).    Allergies  Allergen Reactions  . Biaxin [Clarithromycin] Nausea Only    Thank you for allowing pharmacy to be a part of this patient's care.  Tobie Lords, PharmD, BCPS Clinical Pharmacist 06/02/2017

## 2017-05-21 NOTE — ED Notes (Signed)
Resp notified of arterial blood gas order on pt

## 2017-05-21 NOTE — ED Notes (Signed)
Patient's oxygen increased to 4L [place].

## 2017-05-21 NOTE — ED Notes (Signed)
Pt placed on bear hugger.  

## 2017-05-21 NOTE — ED Notes (Signed)
EDP aware of pt's o2 saturation, no new orders at this time.

## 2017-05-21 NOTE — ED Notes (Signed)
Pt placed on 2L of oxygen. 

## 2017-05-21 NOTE — ED Notes (Signed)
Adm Doctor in rm at this time.

## 2017-05-21 NOTE — ED Notes (Signed)
Per admitting MD, pt comfort care, can be removed from monitors. Notified BP 61/44, no new orders.

## 2017-05-21 NOTE — ED Triage Notes (Signed)
Per EMS, pt from home with reports of diarrhea, nausea and weakness that began today.

## 2017-05-21 NOTE — ED Notes (Signed)
patient has abrasion to right knee. Abrasion cleaned. Sterile non-adherent dressing placed.

## 2017-05-21 NOTE — ED Notes (Signed)
Pt given oral care for comfort.

## 2017-05-21 NOTE — ED Notes (Signed)
Pt placed in trendelenburg, EDP aware of BP.

## 2017-05-21 NOTE — H&P (Signed)
[facility] [place] NAME: [name] Good    MR#:  [id number]  DATE OF BIRTH:  22-Mar-1931  DATE OF ADMISSION:  05/15/2017  PRIMARY CARE PHYSICIAN: Juline Patch, MD   REQUESTING/REFERRING PHYSICIAN:   CHIEF COMPLAINT:   Chief Complaint  Patient presents with  . Weakness  . Diarrhea    HISTORY OF PRESENT ILLNESS: [name] Good  is a 83 y.o. male with a known history per below, presenting with diarrhea/nausea/emesis/altered mental status via EMS, decreased urine output, generalized weakness/fatigue, ER workup noted for severe sepsis secondary to unknown, severe hypotension with systolic blood pressure in the 60s, numerous family members at the bedside-not interested in any intervention, would like for patient to remain DNR, family wants comfort care only going forward given severely debilitated state.  Patient is now being admitted for acute sepsis secondary to unknown etiology, lactic acidosis, on comfort care status. PAST MEDICAL HISTORY:   Past Medical History:  Diagnosis Date  . Arthritis   . Atrial fibrillation ([place])   . CAD (coronary artery disease)   . Cancer ([place])   . CHF (congestive heart failure) ([place])   . Diabetes mellitus without complication ([place])   . Hyperlipidemia   . Hypertension   . Restless leg 09/01/2016  . Stroke ([place])   . Venous insufficiency     PAST SURGICAL HISTORY:  Past Surgical History:  Procedure Laterality Date  . BACK SURGERY    . COLONOSCOPY W/ BIOPSIES AND MANOMETRY CATHETER PLACEMENT  2014   normal  . EYE SURGERY Bilateral    cataract  . HERNIA REPAIR    . SHOULDER SURGERY Right   . SKIN CANCER EXCISION     nose, ear, arm, and face    SOCIAL HISTORY:  Social History   Tobacco Use  . Smoking status: Former Research scientist (life sciences)  . Smokeless tobacco: Current User    Types: Chew  Substance Use Topics  . Alcohol use: No    Alcohol/week: 0.0 oz    FAMILY HISTORY:  Family History  Problem Relation Age of  Onset  . Heart disease Father   . Hypertension Father   . Stroke Father     DRUG ALLERGIES:  Allergies  Allergen Reactions  . Biaxin [Clarithromycin] Nausea Only    REVIEW OF SYSTEMS:  Unable to be obtained given comatose state  MEDICATIONS AT HOME:  Prior to Admission medications   Medication Sig Start Date End Date Taking? Authorizing Provider  aspirin EC 325 MG tablet Take 325 mg by mouth daily.    [provider]  atorvastatin (LIPITOR) 20 MG tablet Take 10 mg by mouth daily.    [provider]  donepezil (ARICEPT) 10 MG tablet Take 10 mg by mouth at bedtime.    [provider]  insulin glargine (LANTUS) 100 UNIT/ML injection Inject 0.1 mLs (10 Units total) into the skin daily. 01/30/17   Juline Patch, MD  Melatonin 3 MG TABS Take 3 mg by mouth at bedtime.    [provider]  meloxicam (MOBIC) 15 MG tablet Take 1 tablet (15 mg total) by mouth daily. 12/27/16   Juline Patch, MD  memantine (NAMENDA) 10 MG tablet TAKE (1) TABLET BY MOUTH TWICE DAILY 03/20/17   Juline Patch, MD  pramipexole (MIRAPEX) 1 MG tablet Take 1.5 tablets (1.5 mg total) by mouth at bedtime. Dr Enzo Montgomery 10/11/15   Loletha Grayer, MD  rOPINIRole (REQUIP) 1 MG tablet TAKE TWO TABLETS AT BEDTIME.  02/15/17   Juline Patch, MD  rOPINIRole (REQUIP) 2 MG tablet Take 2 mg by mouth at bedtime.    [provider]  spironolactone (ALDACTONE) 25 MG tablet Take 25 mg by mouth daily.    [provider]  tamsulosin (FLOMAX) 0.4 MG CAPS capsule TAKE (1) CAPSULE BY MOUTH AT BEDTIME 03/20/17   Juline Patch, MD  zinc oxide (BALMEX) 11.3 % CREA cream Apply 1 application topically 2 (two) times daily. 10/19/16   Bettey Costa, MD      PHYSICAL EXAMINATION:   VITAL SIGNS: Blood pressure (!) 65/47, pulse 91, temperature (!) 96.7 F (35.9 C), temperature source Rectal, resp. rate 17, height 5\' 7"  (1.702 m), weight 54.4 kg (120 lb), SpO2 96 %.  GENERAL:  83 y.o.-year-old  patient lying in the bed with no acute distress.  [place]ound appearance EYES: Pupils equal, round, reactive to light and accommodation. No scleral icterus. Extraocular muscles intact.  HEENT: Head atraumatic, normocephalic. Oropharynx and nasopharynx clear.  NECK:  Supple, no jugular venous distention. No thyroid enlargement, no tenderness.  LUNGS: Coarse breath sounds bilaterally   CARDIOVASCULAR: S1, S2 normal. No murmurs, rubs, or gallops.  ABDOMEN: Soft, nontender, nondistended. Bowel sounds present. No organomegaly or mass.  EXTREMITIES: No pedal edema, cyanosis, or clubbing.  NEUROLOGIC: Comatose state. Gait not checked.  PSYCHIATRIC: Comatose state   SKIN: No obvious rash, lesion, or ulcer.   LABORATORY PANEL:   CBC Recent Labs  Lab 05/20/2017 2019  WBC 8.6  HGB 12.2*  HCT 38.4*  PLT 179  MCV 102.9*  MCH 32.8  MCHC 31.9*  RDW 17.0*  LYMPHSABS 0.6*  MONOABS 0.4  EOSABS 0.0  BASOSABS 0.0   ------------------------------------------------------------------------------------------------------------------  Chemistries  Recent Labs  Lab 06/01/2017 2019  NA 142  K 3.7  CL 94*  CO2 8*  GLUCOSE 218*  BUN 49*  CREATININE 2.47*  CALCIUM 9.1  AST 40  ALT 12*  ALKPHOS 68  BILITOT 1.1   ------------------------------------------------------------------------------------------------------------------ estimated creatinine clearance is 17.1 mL/min (A) (by C-G formula based on SCr of 2.47 mg/dL (H)). ------------------------------------------------------------------------------------------------------------------ No results for input(s): TSH, T4TOTAL, T3FREE, THYROIDAB in the last 72 hours.  Invalid input(s): FREET3   Coagulation profile No results for input(s): INR, PROTIME in the last 168 hours. ------------------------------------------------------------------------------------------------------------------- No results for input(s): DDIMER in the last 72  hours. -------------------------------------------------------------------------------------------------------------------  Cardiac Enzymes Recent Labs  Lab 05/20/2017 2019  TROPONINI 0.12*   ------------------------------------------------------------------------------------------------------------------ Invalid input(s): POCBNP  ---------------------------------------------------------------------------------------------------------------  Urinalysis    Component Value Date/Time   COLORURINE YELLOW (A) 10/17/2016 2007   APPEARANCEUR HAZY (A) 10/17/2016 2007   APPEARANCEUR Clear 01/10/2012 1635   LABSPEC 1.018 10/17/2016 2007   LABSPEC 1.014 01/10/2012 1635   PHURINE 5.0 10/17/2016 2007   GLUCOSEU 50 (A) 10/17/2016 2007   GLUCOSEU >=500 01/10/2012 1635   HGBUR NEGATIVE 10/17/2016 2007   BILIRUBINUR NEGATIVE 10/17/2016 2007   BILIRUBINUR negative 02/02/2015 1128   BILIRUBINUR Negative 01/10/2012 1635   KETONESUR 5 (A) 10/17/2016 2007   PROTEINUR 30 (A) 10/17/2016 2007   UROBILINOGEN 0.2 02/02/2015 1128   NITRITE NEGATIVE 10/17/2016 2007   LEUKOCYTESUR NEGATIVE 10/17/2016 2007   LEUKOCYTESUR Negative 01/10/2012 1635     RADIOLOGY: Dg Chest Port 1 View  Result Date: 06/09/2017 CLINICAL DATA:  Shortness of Breath EXAM: PORTABLE CHEST 1 VIEW COMPARISON:  Oct 18, 2016 FINDINGS: There is cardiomegaly with pulmonary venous hypertension. There is no edema or consolidation. No adenopathy. There is aortic atherosclerosis. There is arthropathy in both  shoulders. IMPRESSION: There is a degree of pulmonary vascular congestion without edema or consolidation evident. There is aortic atherosclerosis. Aortic Atherosclerosis (ICD10-I70.0). Electronically Signed   By: Lowella Grip III M.D.   On: 06/01/2017 20:49    EKG: Orders placed or performed during the hospital encounter of 10/17/16  . EKG 12-Lead  . EKG 12-Lead  . EKG 12-Lead  . EKG 12-Lead  . EKG 12-Lead  . EKG 12-Lead  . ED  EKG 12-Lead  . ED EKG 12-Lead  . ED EKG  . ED EKG  . EKG 12-Lead  . EKG 12-Lead  . EKG    IMPRESSION AND PLAN: Acute sepsis Acute encephalopathy secondary to sepsis Acute comatose state Acute lactic acidosis  1 admit to regular nursing floor bed on our comfort care protocol, hospice consultation, case management to assist with disposition planning, and continue expectant management  All the records are reviewed and case discussed with ED provider. Management plans discussed with the patient, family and they are in agreement.  CODE STATUS:    Code Status Orders  (From admission, onward)        Start     Ordered   06/08/2017 2335  Do not attempt resuscitation (DNR)  Continuous    Question Answer Comment  In the event of cardiac or respiratory ARREST Do not call a "code blue"   In the event of cardiac or respiratory ARREST Do not perform Intubation, CPR, defibrillation or ACLS   In the event of cardiac or respiratory ARREST Use medication by any route, position, wound care, and other measures to relive pain and suffering. May use oxygen, suction and manual treatment of airway obstruction as needed for comfort.      06/06/2017 2337    Code Status History    Date Active Date Inactive Code Status Order ID Comments User Context   05/14/2017 23:37 06/12/2017 23:37 DNR [id number]  Gorden Harms, MD ED   10/18/2016 02:28 10/19/2016 17:26 DNR [id number]  Lance Coon, MD Inpatient   09/01/2016 23:50 09/04/2016 17:29 DNR [id number]  Lance Coon, MD Inpatient   09/01/2016 21:20 09/01/2016 23:50 DNR [id number]  Merlyn Lot, MD ED   10/05/2015 00:08 10/11/2015 14:18 Full Code [id number]  Mikael Spray, NP ED    Advance Directive Documentation     Most Recent Value  Type of Advance Directive  Healthcare Power of [place], Out of facility DNR (pink MOST or yellow form), Living will  Pre-existing out of facility DNR order (yellow form or pink MOST form)  No data  "MOST" Form in Place?   No data       TOTAL TIME TAKING CARE OF THIS PATIENT: 40 minutes.    Avel Peace Gustavo Meditz M.D on 05/16/2017   Between 7am to 6pm - Pager - [phone]  After 6pm go to [email or web address] - password [email or web address] [facility]  Office  [phone]  CC: Primary care physician; Juline Patch, MD   Note: This dictation was prepared with Dragon dictation along with smaller phrase technology. Any transcriptional errors that result from this process are unintentional.

## 2017-05-21 NOTE — ED Notes (Addendum)
Family at bedside, states pt is a Hospice pt and has had declining health over the last few months.

## 2017-05-21 NOTE — ED Notes (Signed)
MD at bedside. MD aware of BP.

## 2017-05-21 NOTE — ED Notes (Signed)
Informed MD Paduchowski of patient's BP and vital signs. MD to bedside

## 2017-05-21 NOTE — ED Notes (Signed)
Family at bedside waiting for more family to arrive.

## 2017-05-22 ENCOUNTER — Other Ambulatory Visit: Payer: Self-pay

## 2017-05-26 LAB — CULTURE, BLOOD (ROUTINE X 2)
CULTURE: NO GROWTH
Culture: NO GROWTH
SPECIAL REQUESTS: ADEQUATE
SPECIAL REQUESTS: ADEQUATE

## 2017-06-13 NOTE — Discharge Summary (Signed)
Physician Discharge Summary  Patient ID: [name] Good MRN: [id number] DOB/AGE: 82/06/1930 82 y.o.  Admit date: 06/09/2017 Discharge date: Jun 09, 2017  Admission Diagnoses:  Discharge Diagnoses:  Active Problems:   Sepsis [facility])   Discharged Condition: death  HPI: [name] Good  is a 84 y.o. male with a known history per below, presenting with diarrhea/nausea/emesis/altered mental status via EMS, decreased urine output, generalized weakness/fatigue, ER workup noted for severe sepsis secondary to unknown, severe hypotension with systolic blood pressure in the 60s, numerous family members at the bedside-not interested in any intervention, would like for patient to remain DNR, family wants comfort care only going forward given severely debilitated state.  Patient is now being admitted for acute sepsis secondary to unknown etiology, lactic acidosis, on comfort care status.  Hospital Course:  Shortly after admission patient pronounced dead by nursing staff at 5:11 AM per protocol.  Family present at time of passing.  For more specific details please see chart.  Consults: None  Significant Diagnostic Studies: labs:   Treatments: Comfort care measures  Discharge Exam: Blood pressure (!) 60/32, pulse 93, temperature (!) 96.7 F (35.9 C), temperature source Rectal, resp. rate 18, height 5\' 7"  (1.702 m), weight 61.9 kg (136 lb 6.4 oz), SpO2 92 %. Death  Disposition: 20-Expired   Allergies as of 2017-06-09      Reactions   Biaxin [clarithromycin] Nausea Only      Medication List    ASK your doctor about these medications   aspirin EC 325 MG tablet Take 325 mg by mouth daily.   atorvastatin 20 MG tablet Commonly known as:  LIPITOR Take 10 mg by mouth daily.   donepezil 10 MG tablet Commonly known as:  ARICEPT Take 10 mg by mouth at bedtime.   insulin glargine 100 UNIT/ML injection Commonly known as:  LANTUS Inject 0.1 mLs (10 Units total) into the skin daily.    Melatonin 3 MG Tabs Take 3 mg by mouth at bedtime.   meloxicam 15 MG tablet Commonly known as:  MOBIC Take 1 tablet (15 mg total) by mouth daily.   memantine 10 MG tablet Commonly known as:  NAMENDA TAKE (1) TABLET BY MOUTH TWICE DAILY   pramipexole 1 MG tablet Commonly known as:  MIRAPEX Take 1.5 tablets (1.5 mg total) by mouth at bedtime. Dr Enzo Montgomery   rOPINIRole 2 MG tablet Commonly known as:  REQUIP Take 2 mg by mouth at bedtime.   rOPINIRole 1 MG tablet Commonly known as:  REQUIP TAKE TWO TABLETS AT BEDTIME.   spironolactone 25 MG tablet Commonly known as:  ALDACTONE Take 25 mg by mouth daily.   tamsulosin 0.4 MG Caps capsule Commonly known as:  FLOMAX TAKE (1) CAPSULE BY MOUTH AT BEDTIME   zinc oxide 11.3 % Crea cream Commonly known as:  BALMEX Apply 1 application topically 2 (two) times daily.        SignedAvel Peace Summer Parthasarathy June 09, 2017, 8:27 PM

## 2017-06-13 NOTE — Final Progress Note (Signed)
Nursing called into room by family at bedside.  No heart or lung sounds auscultated by this RN, confirmed by 2nd RN, Aris Georgia.  Emotional support and condolences given to family at bedside including granddaughter (41).  Family left bedside after funeral home release form completed by this RN and granddaughter Berton Mount.  Supervisor, Ann, aware that body prep complete.  Awaiting transport.

## 2017-06-13 DEATH — deceased

## 2017-08-15 ENCOUNTER — Other Ambulatory Visit: Payer: Self-pay

## 2017-09-21 IMAGING — CT CT ABD-PELV W/O CM
2 of 4 series · 14 of 46 positions shown, 16 images · non-contrast
Comparison: 10/08/2008

CLINICAL DATA: Nausea and vomiting. Diarrhea for couple of days.
Low blood pressure. Renal insufficiency.

EXAM:
CT ABDOMEN AND PELVIS WITHOUT CONTRAST
TECHNIQUE: Multidetector CT imaging of the abdomen and pelvis was performed
following the standard protocol without IV contrast.

[Series 2: routine abd/pel wo · axial · 0.77mm/px · z∈[+81,+466]mm · 11 of 93 slices shown, 13 images]
[im 8/93  soft-tissue]
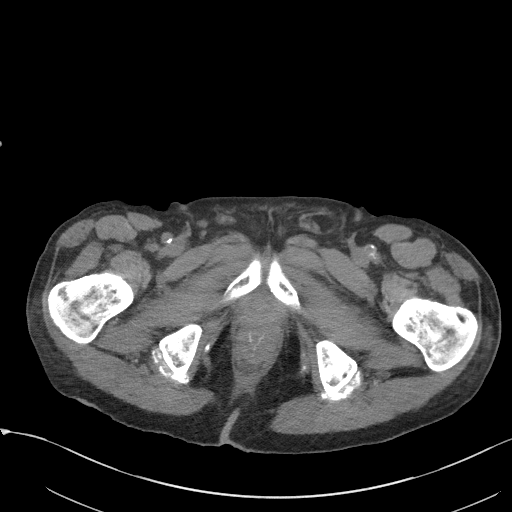
[im 8/93  bone]
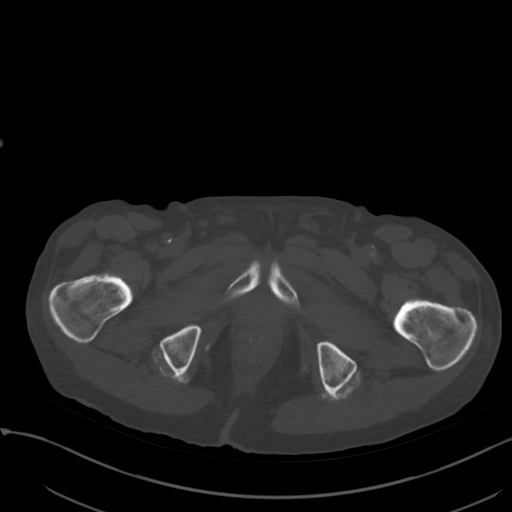
[im 16/93  soft-tissue]
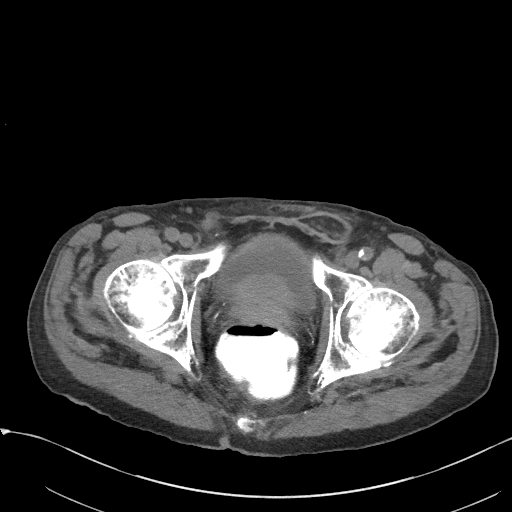
[im 24/93  soft-tissue]
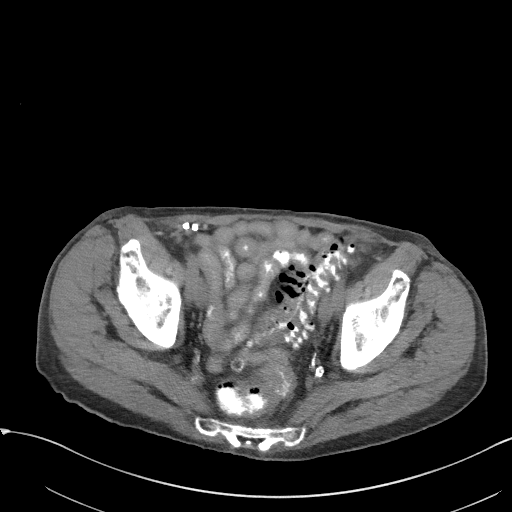
[im 31/93  soft-tissue]
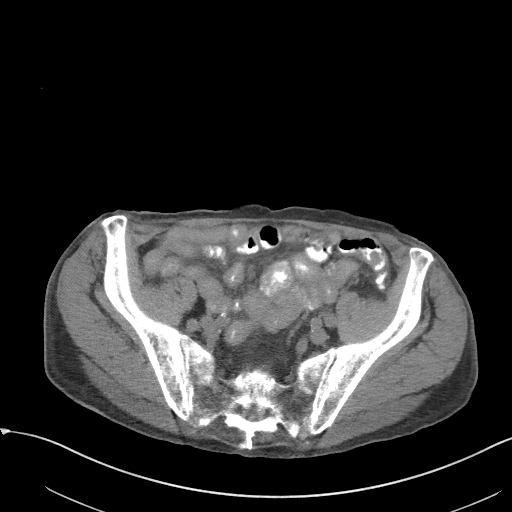
[im 39/93  soft-tissue]
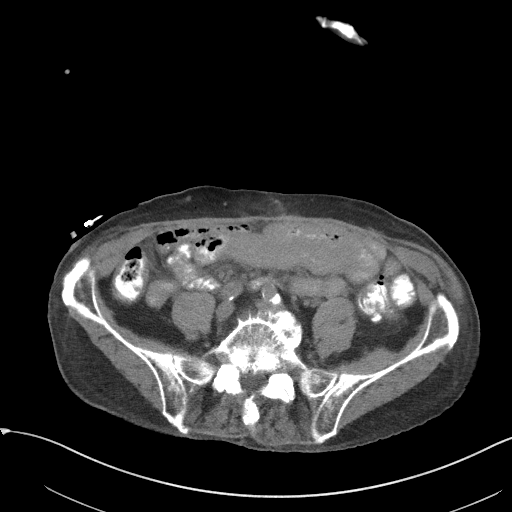
[im 47/93  soft-tissue]
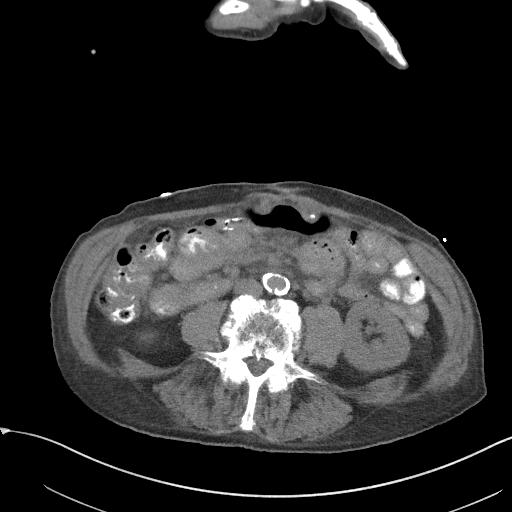
[im 54/93  soft-tissue]
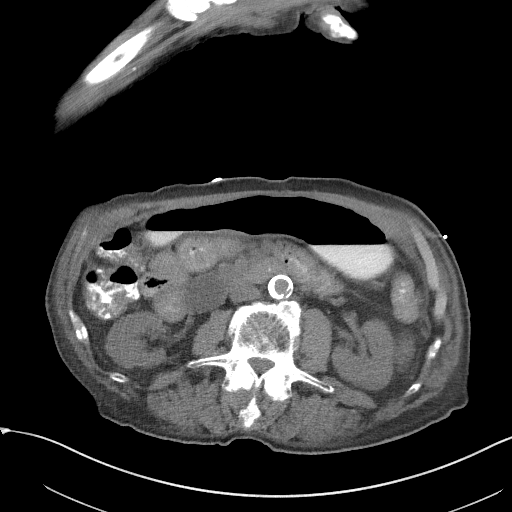
[im 62/93  soft-tissue]
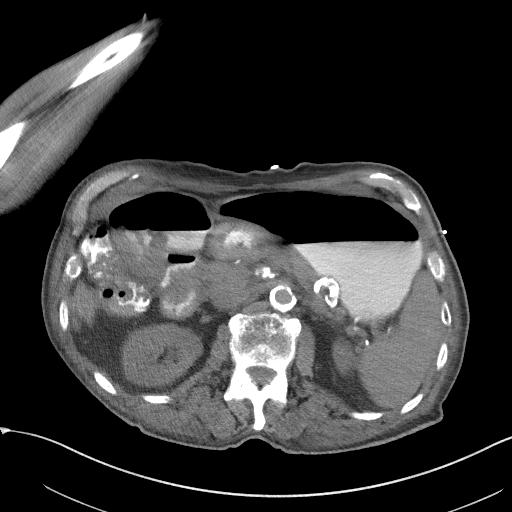
[im 70/93  soft-tissue]
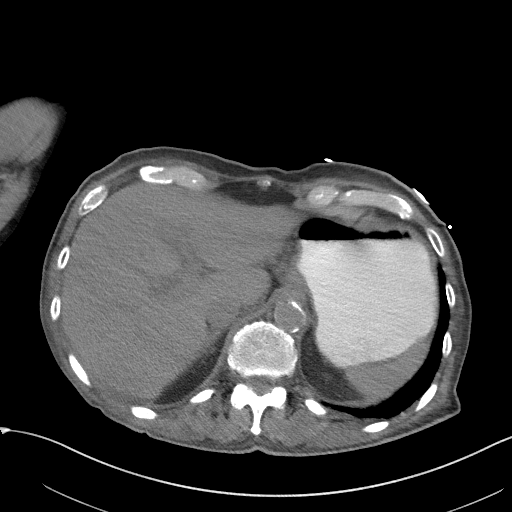
[im 70/93  bone]
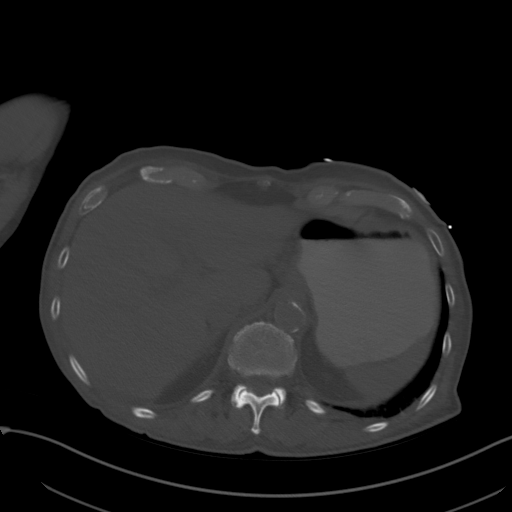
[im 77/93  soft-tissue]
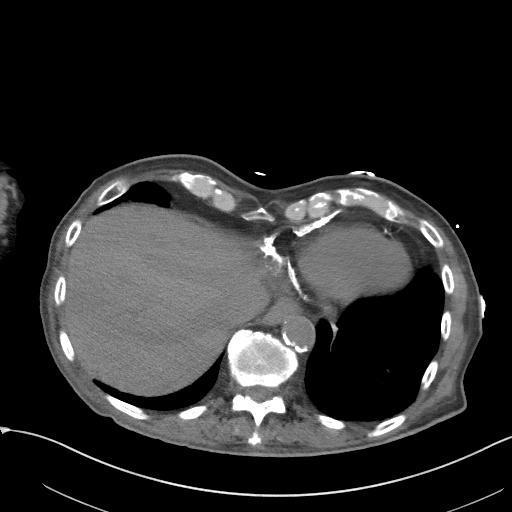
[im 85/93  soft-tissue]
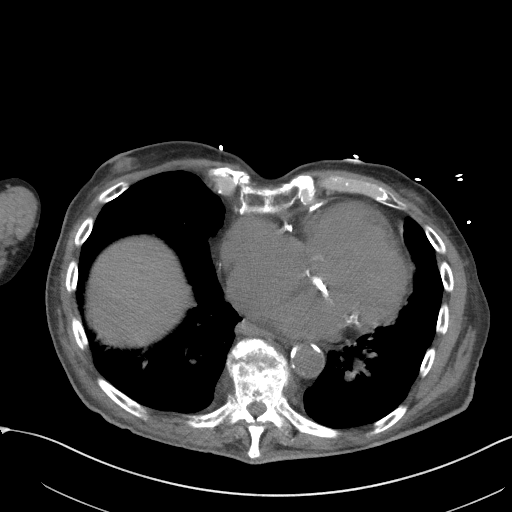

[Series 5: coronal st · coronal · 0.67mm/px · 3 of 83 slices shown]
[im 28/83  soft-tissue]
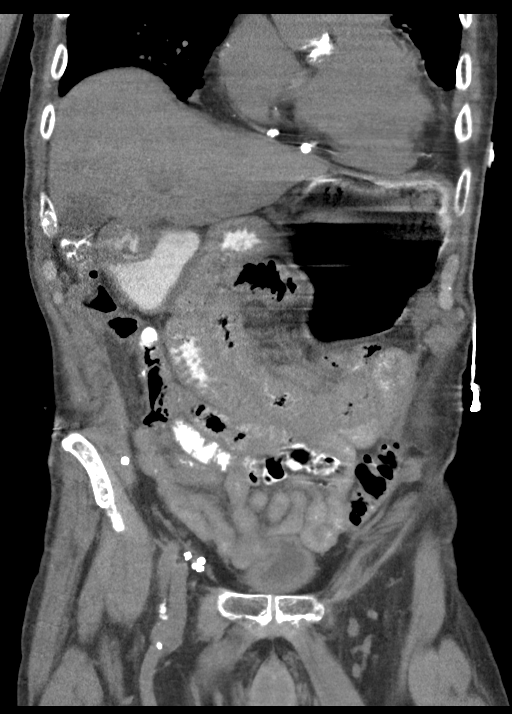
[im 37/83  soft-tissue]
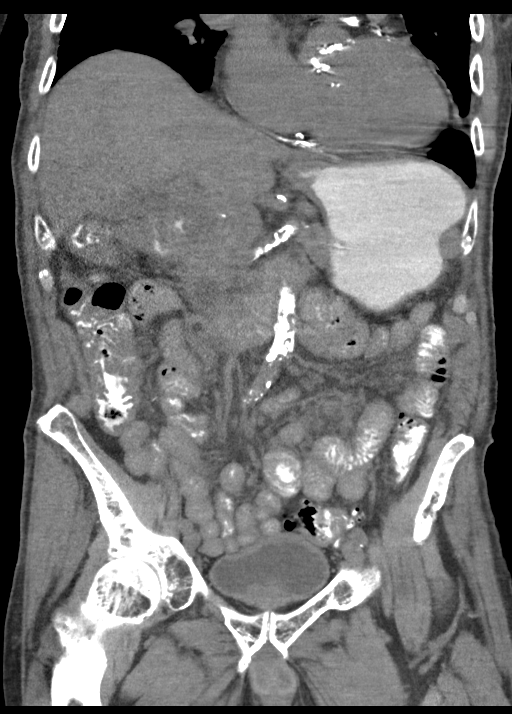
[im 46/83  soft-tissue]
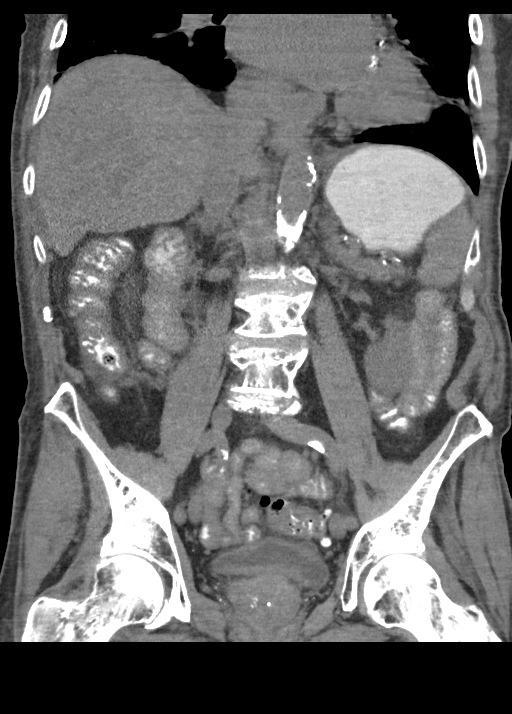

[14 of 46 positions shown; findings below may reference images not displayed]

FINDINGS: Lower chest: Mild atelectasis in the lung bases. Coronary artery and
aortic calcifications. Pericardial calcifications. Pectus excavatum.

Hepatobiliary: No focal liver abnormality is seen. No gallstones,
gallbladder wall thickening, or biliary dilatation.

Pancreas: There is a cystic lesion which appears to arise from the
uncinate process of the pancreas and measures about 2.5 x 3.2 cm.
This is enlarging since the previous study (2 cm diameter
previously). Differential diagnosis would include a mucinous cystic
neoplasm. No mural nodules, septation, or wall thickening
identified. No pancreatic duct dilatation.

Spleen: Normal in size without focal abnormality.

Adrenals/Urinary Tract: No adrenal gland nodules. Kidneys are
symmetrical in size. No hydronephrosis or hydroureter. No stones
identified. Bladder wall is diffusely thickened, suggesting outlet
obstruction or cystitis.

Stomach/Bowel: Stomach is unremarkable. Small bowel are
decompressed. Contrast material flows through to the colon without
evidence of small bowel obstruction. Decompression of the small
bowel limits evaluation but some small bowel loops appear to have
diffuse wall thickening which may indicate changes due to enteritis.
Contrast material flows through the colon to the rectum without
evidence of colonic obstruction. Diverticulosis of the sigmoid colon
with wall thickening probably due to muscular hypertrophy. No
inflammatory changes to suggest diverticulitis. Appendix is not
identified.

Vascular/Lymphatic: Diffuse vascular calcification throughout the
abdominal aorta and major branch vessels. No significant
lymphadenopathy.

Reproductive: Prostate gland is enlarged, measuring 4.8 cm.

Other: Postoperative right inguinal hernia repair. Small left
inguinal hernia containing fat. Surgical scarring along the midline
in the anterior abdominal wall. No free air or free fluid in the
abdomen.

Musculoskeletal: Degenerative changes throughout the lumbar spine.
Diffuse demineralization suggesting osteoporosis. Compression
fractures of T9 and T10 vertebrae. These have progressed since the
previous two view chest from 10/10/2014. The cortex is irregular and
linear lucencies are present suggesting probable acute fractures.
IMPRESSION: 1. 3.2 cm diameter cystic lesion in the pancreas, increasing from
previous CT. This is likely represent mucinous cystic neoplasm. In a
patient greater than 80 years of age, consider follow-up CT in 2
years or no follow-up, depending on patient's clinical condition.
2. Suggestion of small bowel wall thickening possibly indicating
enteritis or possibly due to under distention. No evidence of bowel
obstruction. Diverticulosis of sigmoid colon without evidence of
diverticulitis.
3. Aortic atherosclerosis.
4. Bladder wall thickening could indicate cystitis or bladder outlet
obstruction. Prostate gland is enlarged.
5. Diffuse bone demineralization suggesting osteoporosis.
Progressing compression fractures seen at T9 and T10, probably
acute.

## 2018-04-25 IMAGING — DX DG CHEST 1V PORT
1 series · 1 of 1 positions shown · non-contrast
Comparison: October 18, 2016

CLINICAL DATA: Shortness of Breath

EXAM:
PORTABLE CHEST 1 VIEW

[chest ap]
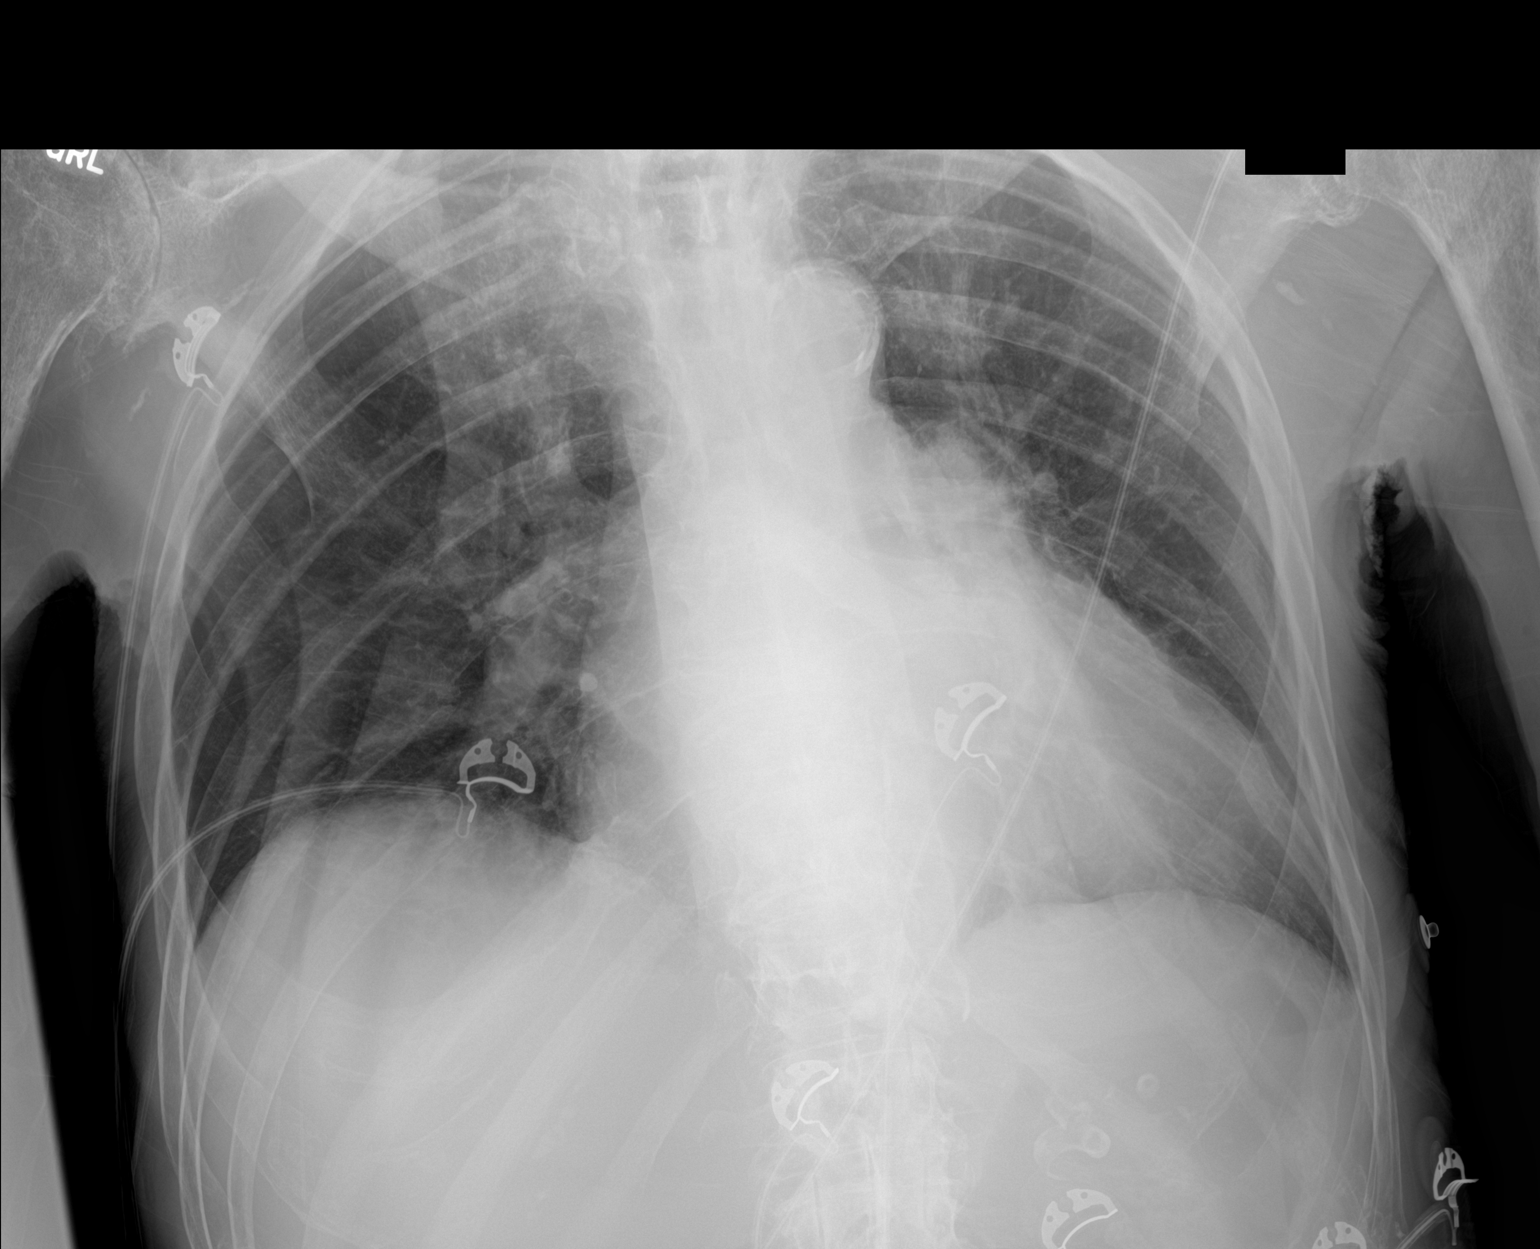

[1 of 1 positions shown; findings below may reference images not displayed]

FINDINGS: There is cardiomegaly with pulmonary venous hypertension. There is
no edema or consolidation. No adenopathy. There is aortic
atherosclerosis. There is arthropathy in both shoulders.
IMPRESSION: There is a degree of pulmonary vascular congestion without edema or
consolidation evident. There is aortic atherosclerosis.

Aortic Atherosclerosis (POCNH-IMY.Y).
